# Patient Record
Sex: Male | Born: 1950 | Race: White | Hispanic: No | Smoking: Former smoker
Health system: Southern US, Community
[De-identification: ages and names within clinical notes are randomized; demographics above are authoritative.]

## PROBLEM LIST (undated history)

## (undated) DIAGNOSIS — Z950 Presence of cardiac pacemaker: Secondary | ICD-10-CM

## (undated) DIAGNOSIS — IMO0001 Reserved for inherently not codable concepts without codable children: Secondary | ICD-10-CM

## (undated) DIAGNOSIS — E785 Hyperlipidemia, unspecified: Secondary | ICD-10-CM

## (undated) DIAGNOSIS — I10 Essential (primary) hypertension: Secondary | ICD-10-CM

## (undated) DIAGNOSIS — C229 Malignant neoplasm of liver, not specified as primary or secondary: Secondary | ICD-10-CM

## (undated) DIAGNOSIS — E669 Obesity, unspecified: Secondary | ICD-10-CM

## (undated) DIAGNOSIS — I219 Acute myocardial infarction, unspecified: Secondary | ICD-10-CM

## (undated) HISTORY — PX: TONSILLECTOMY: SUR1361

## (undated) HISTORY — PX: PACEMAKER PLACEMENT: SHX43

## (undated) HISTORY — PX: CORONARY ANGIOPLASTY: SHX604

## (undated) HISTORY — DX: Obesity, unspecified: E66.9

## (undated) HISTORY — PX: PROSTATE SURGERY: SHX751

## (undated) HISTORY — PX: KNEE ARTHROSCOPY: SUR90

## (undated) HISTORY — DX: Presence of cardiac pacemaker: Z95.0

## (undated) HISTORY — DX: Hyperlipidemia, unspecified: E78.5

## (undated) HISTORY — PX: INSERT / REPLACE / REMOVE PACEMAKER: SUR710

## (undated) HISTORY — DX: Essential (primary) hypertension: I10

---
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
## 1984-07-09 HISTORY — PX: BACK SURGERY: SHX140

## 1999-07-10 HISTORY — PX: ABDOMINAL HERNIA REPAIR: SHX539

## 2004-04-11 ENCOUNTER — Ambulatory Visit: Payer: Self-pay | Admitting: Internal Medicine

## 2006-03-13 ENCOUNTER — Ambulatory Visit: Payer: Self-pay | Admitting: Internal Medicine

## 2006-03-22 ENCOUNTER — Ambulatory Visit: Payer: Self-pay | Admitting: Orthopaedic Surgery

## 2010-02-20 ENCOUNTER — Ambulatory Visit: Payer: Self-pay | Admitting: Internal Medicine

## 2011-06-13 ENCOUNTER — Ambulatory Visit: Payer: Self-pay

## 2013-10-15 DIAGNOSIS — I255 Ischemic cardiomyopathy: Secondary | ICD-10-CM | POA: Insufficient documentation

## 2013-10-15 DIAGNOSIS — I252 Old myocardial infarction: Secondary | ICD-10-CM | POA: Insufficient documentation

## 2013-10-15 DIAGNOSIS — I214 Non-ST elevation (NSTEMI) myocardial infarction: Secondary | ICD-10-CM | POA: Insufficient documentation

## 2013-10-15 DIAGNOSIS — I5020 Unspecified systolic (congestive) heart failure: Secondary | ICD-10-CM

## 2013-10-15 DIAGNOSIS — K649 Unspecified hemorrhoids: Secondary | ICD-10-CM | POA: Insufficient documentation

## 2013-12-07 ENCOUNTER — Ambulatory Visit: Payer: Self-pay | Admitting: Physician Assistant

## 2013-12-07 LAB — URINALYSIS, COMPLETE
Bilirubin,UR: NEGATIVE
Glucose,UR: NEGATIVE mg/dL (ref 0–75)
Ketone: NEGATIVE
Nitrite: POSITIVE
Ph: 6.5 (ref 4.5–8.0)
Protein: 100
RBC,UR: >30 /HPF (ref 0–5)
Specific Gravity: 1.015 (ref 1.003–1.030)
Squamous Epithelial: NONE SEEN

## 2013-12-09 LAB — URINE CULTURE

## 2013-12-23 DIAGNOSIS — Z95810 Presence of automatic (implantable) cardiac defibrillator: Secondary | ICD-10-CM | POA: Insufficient documentation

## 2014-01-21 DIAGNOSIS — N401 Enlarged prostate with lower urinary tract symptoms: Secondary | ICD-10-CM

## 2014-01-21 DIAGNOSIS — R339 Retention of urine, unspecified: Secondary | ICD-10-CM | POA: Insufficient documentation

## 2014-01-21 DIAGNOSIS — N138 Other obstructive and reflux uropathy: Secondary | ICD-10-CM | POA: Insufficient documentation

## 2014-01-21 DIAGNOSIS — R310 Gross hematuria: Secondary | ICD-10-CM | POA: Insufficient documentation

## 2014-03-23 DIAGNOSIS — N320 Bladder-neck obstruction: Secondary | ICD-10-CM | POA: Insufficient documentation

## 2014-04-08 DIAGNOSIS — R339 Retention of urine, unspecified: Secondary | ICD-10-CM | POA: Insufficient documentation

## 2014-08-17 ENCOUNTER — Ambulatory Visit: Payer: Self-pay | Admitting: Family Medicine

## 2014-08-31 ENCOUNTER — Ambulatory Visit: Payer: Self-pay | Admitting: Emergency Medicine

## 2015-03-09 ENCOUNTER — Encounter: Payer: Self-pay | Admitting: Emergency Medicine

## 2015-03-09 ENCOUNTER — Other Ambulatory Visit: Payer: Self-pay

## 2015-03-09 ENCOUNTER — Emergency Department
Admission: EM | Admit: 2015-03-09 | Discharge: 2015-03-09 | Disposition: A | Discharge status: Home / Self Care | Payer: PPO | Attending: Emergency Medicine | Admitting: Emergency Medicine

## 2015-03-09 ENCOUNTER — Emergency Department: Payer: PPO

## 2015-03-09 DIAGNOSIS — I472 Ventricular tachycardia, unspecified: Secondary | ICD-10-CM

## 2015-03-09 DIAGNOSIS — I10 Essential (primary) hypertension: Secondary | ICD-10-CM | POA: Diagnosis not present

## 2015-03-09 DIAGNOSIS — R079 Chest pain, unspecified: Secondary | ICD-10-CM | POA: Insufficient documentation

## 2015-03-09 DIAGNOSIS — Y838 Other surgical procedures as the cause of abnormal reaction of the patient, or of later complication, without mention of misadventure at the time of the procedure: Secondary | ICD-10-CM | POA: Insufficient documentation

## 2015-03-09 DIAGNOSIS — T82198A Other mechanical complication of other cardiac electronic device, initial encounter: Secondary | ICD-10-CM | POA: Insufficient documentation

## 2015-03-09 DIAGNOSIS — Z87891 Personal history of nicotine dependence: Secondary | ICD-10-CM | POA: Diagnosis not present

## 2015-03-09 DIAGNOSIS — I252 Old myocardial infarction: Secondary | ICD-10-CM | POA: Insufficient documentation

## 2015-03-09 DIAGNOSIS — R531 Weakness: Secondary | ICD-10-CM | POA: Diagnosis present

## 2015-03-09 HISTORY — DX: Acute myocardial infarction, unspecified: I21.9

## 2015-03-09 LAB — CBC WITH DIFFERENTIAL/PLATELET
BASOS PCT: 1 %
Basophils Absolute: 0.1 10*3/uL (ref 0–0.1)
EOS ABS: 0.2 10*3/uL (ref 0–0.7)
Eosinophils Relative: 2 %
HCT: 46 % (ref 40.0–52.0)
HEMOGLOBIN: 15.8 g/dL (ref 13.0–18.0)
LYMPHS ABS: 1.3 10*3/uL (ref 1.0–3.6)
Lymphocytes Relative: 14 %
MCH: 30 pg (ref 26.0–34.0)
MCHC: 34.3 g/dL (ref 32.0–36.0)
MCV: 87.4 fL (ref 80.0–100.0)
MONO ABS: 0.6 10*3/uL (ref 0.2–1.0)
MONOS PCT: 6 %
Neutro Abs: 7.4 10*3/uL — ABNORMAL HIGH (ref 1.4–6.5)
Neutrophils Relative %: 77 %
Platelets: 202 10*3/uL (ref 150–440)
RBC: 5.26 MIL/uL (ref 4.40–5.90)
RDW: 14 % (ref 11.5–14.5)
WBC: 9.6 10*3/uL (ref 3.8–10.6)

## 2015-03-09 LAB — COMPREHENSIVE METABOLIC PANEL
ALBUMIN: 4.4 g/dL (ref 3.5–5.0)
ALT: 35 U/L (ref 17–63)
AST: 24 U/L (ref 15–41)
Alkaline Phosphatase: 78 U/L (ref 38–126)
Anion gap: 13 (ref 5–15)
BUN: 20 mg/dL (ref 6–20)
CO2: 23 mmol/L (ref 22–32)
Calcium: 10 mg/dL (ref 8.9–10.3)
Chloride: 103 mmol/L (ref 101–111)
Creatinine, Ser: 0.93 mg/dL (ref 0.61–1.24)
GFR calc Af Amer: >60 mL/min (ref >60)
GFR calc non Af Amer: >60 mL/min (ref >60)
GLUCOSE: 109 mg/dL — AB (ref 65–99)
Potassium: 4.2 mmol/L (ref 3.5–5.1)
SODIUM: 139 mmol/L (ref 135–145)
Total Bilirubin: 1.2 mg/dL (ref 0.3–1.2)
Total Protein: 7.8 g/dL (ref 6.5–8.1)

## 2015-03-09 LAB — TROPONIN I: Troponin I: 0.03 ng/mL (ref <0.031)

## 2015-03-09 NOTE — ED Notes (Addendum)
[place] to feel weak and dizzy, pt states that then his pacemaker/defibrillator went off. Pt states it took him to the ground. Pt alert and oriented in NAD distress at this time. Pt took 2 SL nitro and EMS gave him 1 spray en route. Pt states this has never happened before.

## 2015-03-09 NOTE — Discharge Instructions (Signed)
You have been seen in the emergency department after defibrillation (shocked by your pacemaker/defibrillator). It appears that you entered a rhythm called ventricular tachycardia, prior to defibrillation. Your pacemaker/defibrillator is working appropriately, and shocked you appropriately. Your labs today are within normal limits. As we discussed it is extremely important that you call Dr. Ubaldo Glassing in the morning to arrange a follow-up appointment for tomorrow or Friday. If you develop any chest pain, trouble breathing, fast heart rate, or any further shocks please return to the emergency department immediately by calling 911.

## 2015-03-09 NOTE — ED Provider Notes (Signed)
[facility] Emergency Department Provider Note  Time seen: 5:34 PM  I have reviewed the triage vital signs and the nursing notes.   HISTORY  Chief Complaint Pacemaker Problem    HPI Jose Dunlap is a 64 y.o. male with a past medical history of hypertension, hyperlipidemia, [employer] pacemaker/defibrillator placed 2014, presents the emergency department after his defibrillator shocked him. According to the patient [place], when he began feeling very weak, dizzy, nauseated. States within several seconds his defibrillator went off and shocked him bringing him to the ground. Denies loss of consciousness. Does state chest pain but that started after the defibrillator shocked him. Denies any symptoms at this time. Received nitroglycerin by EMS prior to arrival. He has not taken aspirin today. Denies any chest pain, shortness of breath, or any other symptoms currently.    Past Medical History  Diagnosis Date  . MI (myocardial infarction)     There are no active problems to display for this patient.   Past Surgical History  Procedure Laterality Date  . Prostate surgery    . Pacemaker placement      No current outpatient prescriptions on file.  Allergies Review of patient's allergies indicates no known allergies.  History reviewed. No pertinent family history.  Social History Social History  Substance Use Topics  . Smoking status: Former Research scientist (life sciences)  . Smokeless tobacco: Never Used  . Alcohol Use: Yes    Review of Systems Constitutional: Negative for fever. Cardiovascular: Positive for chest pain after defibrillation, denies any currently. Respiratory: Negative for shortness of breath. Gastrointestinal: Negative for abdominal pain Neurological: Negative for headache 10-point ROS otherwise negative.  ____________________________________________   PHYSICAL EXAM:  VITAL SIGNS: ED Triage Vitals  Enc Vitals Group     BP 03/09/15  1727 151/107 mmHg     Pulse Rate 03/09/15 1727 114     Resp 03/09/15 1727 19     Temp 03/09/15 1727 97.8 F (36.6 C)     Temp Source 03/09/15 1727 Oral     SpO2 03/09/15 1727 93 %     Weight 03/09/15 1727 269 lb 6.4 oz (122.199 kg)     Height --      Head Cir --      Peak Flow --      Pain Score --      Pain Loc --      Pain Edu? --      Excl. in [place]? --     Constitutional: Alert and oriented. Well appearing and in no distress. Eyes: Normal exam ENT   Head: Normocephalic and atraumatic.   Mouth/Throat: Mucous membranes are moist. Cardiovascular: Normal rate, regular rhythm. No murmur Respiratory: Normal respiratory effort without tachypnea nor retractions. Breath sounds are clear and equal bilaterally. No wheezes/rales/rhonchi. Gastrointestinal: Soft and nontender. No distention.   Musculoskeletal: Nontender with normal range of motion in all extremities. No lower extremity tenderness or edema. Neurologic:  Normal speech and language. No gross focal neurologic deficits  Skin:  Skin is warm, dry and intact.  Psychiatric: Mood and affect are normal. Speech and behavior are normal. Patient exhibits appropriate insight and judgment.  ____________________________________________    EKG  EKG reviewed and interpreted by myself shows atrial sensed ventricular paced rhythm at 115 beats, no concerning changes noted.  ____________________________________________    RADIOLOGY  Chest x-ray within normal limits  ____________________________________________   INITIAL IMPRESSION / ASSESSMENT AND PLAN / ED COURSE  Pertinent labs &  imaging results that were available during my care of the patient were reviewed by me and considered in my medical decision making (see chart for details).  Patient with weakness/dizziness, nausea, followed by defibrillation. Patient now feels normal, denies any symptoms at this time. We will check labs, interrogated the patient's  pacemaker/defibrillator, and discuss with Dr. Ubaldo Glassing. We will closely monitor in the emergency department on telemetry.  Patient's labs are within normal limits, electrolytes within normal limits. Patient's pacemaker was interrogated, the patient appears to have gone into ventricular tachycardia 255 bpm per 14 seconds before his defibrillator shocked him. Patient denies any symptoms currently. I discussed with Dr. Clayborn Bigness, who is on-call for Dr. Ubaldo Glassing. He states that the patient is feeling well he is safe for discharge home with follow-up in the office. I discussed this plan of care with the patient and offered admission if he felt uncomfortable, the patient strongly wishes to go home and will call Dr. Ubaldo Glassing in the morning. I discussed very strict return precautions for the patient with any chest pain, shortness breath, fast heart rate, or any defibrillations the patient is to return to the emergency department immediately via EMS.  ____________________________________________   FINAL CLINICAL IMPRESSION(S) / ED DIAGNOSES  Defibrillation Weakness Ventricular tachycardia  Harvest Dark, MD 03/09/15 [phone]

## 2015-04-19 ENCOUNTER — Encounter: Payer: Self-pay | Admitting: *Deleted

## 2015-04-19 ENCOUNTER — Ambulatory Visit (INDEPENDENT_AMBULATORY_CARE_PROVIDER_SITE_OTHER): Payer: PPO | Admitting: Family Medicine

## 2015-04-19 VITALS — BP 165/100 | HR 76 | Temp 97.9°F | Resp 16 | Ht 70.5 in | Wt 253.0 lb

## 2015-04-19 DIAGNOSIS — Z950 Presence of cardiac pacemaker: Secondary | ICD-10-CM | POA: Insufficient documentation

## 2015-04-19 DIAGNOSIS — R5382 Chronic fatigue, unspecified: Secondary | ICD-10-CM | POA: Diagnosis not present

## 2015-04-19 DIAGNOSIS — E785 Hyperlipidemia, unspecified: Secondary | ICD-10-CM | POA: Diagnosis not present

## 2015-04-19 DIAGNOSIS — Z95810 Presence of automatic (implantable) cardiac defibrillator: Secondary | ICD-10-CM | POA: Diagnosis not present

## 2015-04-19 DIAGNOSIS — Z23 Encounter for immunization: Secondary | ICD-10-CM | POA: Diagnosis not present

## 2015-04-19 DIAGNOSIS — I252 Old myocardial infarction: Secondary | ICD-10-CM | POA: Diagnosis not present

## 2015-04-19 DIAGNOSIS — I10 Essential (primary) hypertension: Secondary | ICD-10-CM | POA: Insufficient documentation

## 2015-04-19 DIAGNOSIS — K625 Hemorrhage of anus and rectum: Secondary | ICD-10-CM | POA: Diagnosis not present

## 2015-04-19 DIAGNOSIS — R635 Abnormal weight gain: Secondary | ICD-10-CM | POA: Insufficient documentation

## 2015-04-19 MED ORDER — LISINOPRIL 10 MG PO TABS: 10.0000 mg | ORAL_TABLET | Freq: Every day | ORAL | Status: DC
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
## 2015-04-20 LAB — CBC WITH DIFFERENTIAL/PLATELET
Basophils Absolute: 0 10*3/uL (ref 0.0–0.2)
Basos: 0 %
EOS (ABSOLUTE): 0.2 10*3/uL (ref 0.0–0.4)
EOS: 3 %
HEMATOCRIT: 44.4 % (ref 37.5–51.0)
HEMOGLOBIN: 15.6 g/dL (ref 12.6–17.7)
IMMATURE GRANS (ABS): 0.1 10*3/uL (ref 0.0–0.1)
Immature Granulocytes: 1 %
LYMPHS ABS: 1.3 10*3/uL (ref 0.7–3.1)
LYMPHS: 15 %
MCH: 30 pg (ref 26.6–33.0)
MCHC: 35.1 g/dL (ref 31.5–35.7)
MCV: 85 fL (ref 79–97)
MONOCYTES: 9 %
Monocytes Absolute: 0.8 10*3/uL (ref 0.1–0.9)
NEUTROS ABS: 6.1 10*3/uL (ref 1.4–7.0)
Neutrophils: 72 %
Platelets: 215 10*3/uL (ref 150–379)
RBC: 5.2 x10E6/uL (ref 4.14–5.80)
RDW: 13.7 % (ref 12.3–15.4)
WBC: 8.5 10*3/uL (ref 3.4–10.8)

## 2015-04-21 ENCOUNTER — Other Ambulatory Visit: Payer: Self-pay | Admitting: Family Medicine

## 2015-04-21 LAB — COMPREHENSIVE METABOLIC PANEL
A/G RATIO: 1.8 (ref 1.1–2.5)
ALBUMIN: 4.2 g/dL (ref 3.6–4.8)
ALK PHOS: 83 IU/L (ref 39–117)
ALT: 37 IU/L (ref 0–44)
AST: 22 IU/L (ref 0–40)
BILIRUBIN TOTAL: 0.9 mg/dL (ref 0.0–1.2)
BUN / CREAT RATIO: 14 (ref 10–22)
BUN: 12 mg/dL (ref 8–27)
CHLORIDE: 98 mmol/L (ref 97–108)
CO2: 22 mmol/L (ref 18–29)
CREATININE: 0.85 mg/dL (ref 0.76–1.27)
Calcium: 9.3 mg/dL (ref 8.6–10.2)
GFR calc Af Amer: 106 mL/min/{1.73_m2} (ref >59)
GFR calc non Af Amer: 91 mL/min/{1.73_m2} (ref >59)
GLOBULIN, TOTAL: 2.4 g/dL (ref 1.5–4.5)
Glucose: 109 mg/dL — ABNORMAL HIGH (ref 65–99)
POTASSIUM: 4.8 mmol/L (ref 3.5–5.2)
SODIUM: 138 mmol/L (ref 134–144)
Total Protein: 6.6 g/dL (ref 6.0–8.5)

## 2015-04-21 LAB — LIPID PANEL
CHOLESTEROL TOTAL: 190 mg/dL (ref 100–199)
Chol/HDL Ratio: 5.8 ratio units — ABNORMAL HIGH (ref 0.0–5.0)
HDL: 33 mg/dL — ABNORMAL LOW (ref >39)
LDL Calculated: 86 mg/dL (ref 0–99)
TRIGLYCERIDES: 353 mg/dL — AB (ref 0–149)
VLDL Cholesterol Cal: 71 mg/dL — ABNORMAL HIGH (ref 5–40)

## 2015-04-21 LAB — TSH: TSH: 2.81 u[IU]/mL (ref 0.450–4.500)

## 2015-04-21 MED ORDER — SIMVASTATIN 40 MG PO TABS: 40.0000 mg | ORAL_TABLET | Freq: Every day | ORAL | Status: DC

## 2015-04-23 LAB — SPECIMEN STATUS REPORT

## 2015-04-23 LAB — HGB A1C W/O EAG: Hgb A1c MFr Bld: 6 % — ABNORMAL HIGH (ref 4.8–5.6)

## 2015-04-24 LAB — VITAMIN D 1,25 DIHYDROXY
VITAMIN D3 1, 25 (OH): 17 pg/mL
Vitamin D 1, 25 (OH)2 Total: 17 pg/mL
Vitamin D2 1, 25 (OH)2: <10 pg/mL

## 2015-05-02 ENCOUNTER — Other Ambulatory Visit: Payer: Self-pay | Admitting: Family Medicine

## 2015-05-02 DIAGNOSIS — I10 Essential (primary) hypertension: Secondary | ICD-10-CM

## 2015-05-02 MED ORDER — LISINOPRIL 10 MG PO TABS: 10.0000 mg | ORAL_TABLET | Freq: Every day | ORAL | Status: DC

## 2015-05-02 MED ORDER — SIMVASTATIN 40 MG PO TABS: 40.0000 mg | ORAL_TABLET | Freq: Every day | ORAL | Status: DC

## 2015-05-02 NOTE — Telephone Encounter (Signed)
Done.[place] 

## 2015-05-02 NOTE — Telephone Encounter (Signed)
Pt called requesting a refill on  Lisinopril 10 mg, simvastatin 20 mg,  [facility]  [phone].

## 2015-05-04 ENCOUNTER — Ambulatory Visit (INDEPENDENT_AMBULATORY_CARE_PROVIDER_SITE_OTHER): Payer: PPO | Admitting: Family Medicine

## 2015-05-04 ENCOUNTER — Encounter: Payer: Self-pay | Admitting: Family Medicine

## 2015-05-04 VITALS — BP 150/95 | HR 76 | Temp 98.1°F | Resp 16 | Ht 70.0 in | Wt 252.6 lb

## 2015-05-04 DIAGNOSIS — Z95810 Presence of automatic (implantable) cardiac defibrillator: Secondary | ICD-10-CM | POA: Diagnosis not present

## 2015-05-04 DIAGNOSIS — E785 Hyperlipidemia, unspecified: Secondary | ICD-10-CM

## 2015-05-04 DIAGNOSIS — Z23 Encounter for immunization: Secondary | ICD-10-CM | POA: Diagnosis not present

## 2015-05-04 DIAGNOSIS — I214 Non-ST elevation (NSTEMI) myocardial infarction: Secondary | ICD-10-CM | POA: Diagnosis not present

## 2015-05-04 DIAGNOSIS — I259 Chronic ischemic heart disease, unspecified: Secondary | ICD-10-CM | POA: Diagnosis not present

## 2015-05-04 DIAGNOSIS — I10 Essential (primary) hypertension: Secondary | ICD-10-CM

## 2015-05-04 MED ORDER — LISINOPRIL 20 MG PO TABS: 10.0000 mg | ORAL_TABLET | Freq: Every day | ORAL | Status: DC

## 2015-05-04 NOTE — Progress Notes (Signed)
. Name: Jose Dunlap   MRN: [id number]    DOB: September 11, 1950   Date:05/04/2015       Progress Note  Subjective  Chief Complaint  Chief Complaint  Patient presents with  . Hypertension    Hypertension Associated symptoms include shortness of breath (with activity). Pertinent negatives include no blurred vision, chest pain, headaches, neck pain, orthopnea or palpitations.     No problem-specific assessment & plan notes found for this encounter.   Past Medical History  Diagnosis Date  . MI (myocardial infarction) ([place])   . Essential hypertension   . Hyperlipidemia   . Pacemaker     Social History  Substance Use Topics  . Smoking status: Former Research scientist (life sciences)  . Smokeless tobacco: Never Used  . Alcohol Use: Yes     Current outpatient prescriptions:  .  aspirin 81 MG chewable tablet, Chew 81 mg by mouth daily., Disp: , Rfl:  .  carvedilol (COREG) 6.25 MG tablet, Take 6.25 mg by mouth 2 (two) times daily., Disp: , Rfl:  .  clopidogrel (PLAVIX) 75 MG tablet, Take 75 mg by mouth daily., Disp: , Rfl:  .  lisinopril (PRINIVIL,ZESTRIL) 10 MG tablet, Take 1 tablet (10 mg total) by mouth daily., Disp: 90 tablet, Rfl: 3 .  Multiple Vitamin (MULTIVITAMIN) capsule, Take 1 capsule by mouth daily., Disp: , Rfl:  .  nitroGLYCERIN (NITROSTAT) 0.4 MG SL tablet, Place 0.4 mg under the tongue as needed., Disp: , Rfl:  .  simvastatin (ZOCOR) 40 MG tablet, Take 1 tablet (40 mg total) by mouth at bedtime., Disp: 90 tablet, Rfl: 3 .  spironolactone (ALDACTONE) 25 MG tablet, Take 25 mg by mouth daily., Disp: , Rfl:   No Known Allergies  Review of Systems  Constitutional: Negative for fever and chills.  HENT: Negative for hearing loss.   Eyes: Negative for blurred vision and double vision.  Respiratory: Positive for shortness of breath (with activity). Negative for wheezing.   Cardiovascular: Negative for chest pain, palpitations, orthopnea and leg swelling.  Gastrointestinal: Negative for  heartburn, abdominal pain and blood in stool.  Genitourinary: Negative for dysuria, urgency and frequency.  Musculoskeletal: Negative for myalgias, back pain and neck pain.  Skin: Negative for rash.  Neurological: Negative for dizziness, tremors and headaches.      Objective  Filed Vitals:   05/04/15 0834  BP: 142/96  Pulse: 76  Temp: 98.1 F (36.7 C)  TempSrc: Oral  Resp: 16  Height: 5' 10"  (1.778 m)  Weight: 252 lb 9.6 oz (114.579 kg)     Physical Exam  Constitutional: He is oriented to person, place, and time and well-developed, well-nourished, and in no distress. No distress.  HENT:  Head: Normocephalic and atraumatic.  Eyes: Conjunctivae and EOM are normal. Pupils are equal, round, and reactive to light. No scleral icterus.  Neck: Normal range of motion. Neck supple. Carotid bruit is not present. No thyromegaly present.  Cardiovascular: Normal rate, regular rhythm, normal heart sounds and intact distal pulses.  Exam reveals no gallop and no friction rub.   No murmur heard. Pulmonary/Chest: Effort normal and breath sounds normal. No respiratory distress. He has no wheezes. He has no rales.  Abdominal: Soft. Bowel sounds are normal. He exhibits no distension. There is no tenderness. There is no rebound.  Musculoskeletal: Normal range of motion. He exhibits no edema.  Lymphadenopathy:    He has no cervical adenopathy.  Neurological: He is alert and oriented to person, place, and time.  Vitals reviewed.  Recent Results (from the past 2160 hour(s))  Comprehensive metabolic panel     Status: Abnormal   Collection Time: 03/09/15  5:34 PM  Result Value Ref Range   Sodium 139 135 - 145 mmol/L   Potassium 4.2 3.5 - 5.1 mmol/L   Chloride 103 101 - 111 mmol/L   CO2 23 22 - 32 mmol/L   Glucose, Bld 109 (H) 65 - 99 mg/dL   BUN 20 6 - 20 mg/dL   Creatinine, Ser 0.93 0.61 - 1.24 mg/dL   Calcium 10.0 8.9 - 10.3 mg/dL   Total Protein 7.8 6.5 - 8.1 g/dL   Albumin 4.4 3.5  - 5.0 g/dL   AST 24 15 - 41 U/L   ALT 35 17 - 63 U/L   Alkaline Phosphatase 78 38 - 126 U/L   Total Bilirubin 1.2 0.3 - 1.2 mg/dL   GFR calc non Af Amer >60 >60 mL/min   GFR calc Af Amer >60 >60 mL/min    Comment: (NOTE) The eGFR has been calculated using the CKD EPI equation. This calculation has not been validated in all clinical situations. eGFR's persistently <60 mL/min signify possible Chronic Kidney Disease.    Anion gap 13 5 - 15  Troponin I     Status: None   Collection Time: 03/09/15  5:34 PM  Result Value Ref Range   Troponin I 0.03 <0.031 ng/mL    Comment:        NO INDICATION OF MYOCARDIAL INJURY.   CBC with Differential     Status: Abnormal   Collection Time: 03/09/15  5:34 PM  Result Value Ref Range   WBC 9.6 3.8 - 10.6 K/uL   RBC 5.26 4.40 - 5.90 MIL/uL   Hemoglobin 15.8 13.0 - 18.0 g/dL   HCT 46.0 40.0 - 52.0 %   MCV 87.4 80.0 - 100.0 fL   MCH 30.0 26.0 - 34.0 pg   MCHC 34.3 32.0 - 36.0 g/dL   RDW 14.0 11.5 - 14.5 %   Platelets 202 150 - 440 K/uL   Neutrophils Relative % 77 %   Neutro Abs 7.4 (H) 1.4 - 6.5 K/uL   Lymphocytes Relative 14 %   Lymphs Abs 1.3 1.0 - 3.6 K/uL   Monocytes Relative 6 %   Monocytes Absolute 0.6 0.2 - 1.0 K/uL   Eosinophils Relative 2 %   Eosinophils Absolute 0.2 0 - 0.7 K/uL   Basophils Relative 1 %   Basophils Absolute 0.1 0 - 0.1 K/uL  Comprehensive Metabolic Panel (CMET)     Status: Abnormal   Collection Time: 04/20/15  8:16 AM  Result Value Ref Range   Glucose 109 (H) 65 - 99 mg/dL   BUN 12 8 - 27 mg/dL   Creatinine, Ser 0.85 0.76 - 1.27 mg/dL   GFR calc non Af Amer 91 >59 mL/min/1.73   GFR calc Af Amer 106 >59 mL/min/1.73   BUN/Creatinine Ratio 14 10 - 22   Sodium 138 134 - 144 mmol/L    Comment: **Effective April 25, 2015 the reference interval**   for Sodium, Serum will be changing to:                                             136 - 144    Potassium 4.8 3.5 - 5.2 mmol/L    Comment: **Effective April 25, 2015 the  reference interval**   for Potassium, Serum will be changing to:                          0 -  7 days        3.7 - 5.2                          8 - 30 days        3.7 - 6.4                          1 -  6 months      3.8 - 6.0                   7 months -  1 year        3.8 - 5.3                              >1 year        3.5 - 5.2    Chloride 98 97 - 108 mmol/L    Comment: **Effective April 25, 2015 the reference interval**   for Chloride, Serum will be changing to:                                              97 - 106    CO2 22 18 - 29 mmol/L   Calcium 9.3 8.6 - 10.2 mg/dL   Total Protein 6.6 6.0 - 8.5 g/dL   Albumin 4.2 3.6 - 4.8 g/dL   Globulin, Total 2.4 1.5 - 4.5 g/dL   Albumin/Globulin Ratio 1.8 1.1 - 2.5   Bilirubin Total 0.9 0.0 - 1.2 mg/dL   Alkaline Phosphatase 83 39 - 117 IU/L   AST 22 0 - 40 IU/L   ALT 37 0 - 44 IU/L  Lipid Profile     Status: Abnormal   Collection Time: 04/20/15  8:16 AM  Result Value Ref Range   Cholesterol, Total 190 100 - 199 mg/dL   Triglycerides 353 (H) 0 - 149 mg/dL   HDL 33 (L) >39 mg/dL    Comment: According to ATP-III Guidelines, HDL-C >59 mg/dL is considered a negative risk factor for CHD.    VLDL Cholesterol Cal 71 (H) 5 - 40 mg/dL   LDL Calculated 86 0 - 99 mg/dL   Chol/HDL Ratio 5.8 (H) 0.0 - 5.0 ratio units    Comment:                                   T. Chol/HDL Ratio                                             Men  Women                               1/2 Avg.Risk  3.4    3.3  Avg.Risk  5.0    4.4                                2X Avg.Risk  9.6    7.1                                3X Avg.Risk 23.4   11.0   CBC with Differential     Status: None   Collection Time: 04/20/15  8:16 AM  Result Value Ref Range   WBC 8.5 3.4 - 10.8 x10E3/uL   RBC 5.20 4.14 - 5.80 x10E6/uL   Hemoglobin 15.6 12.6 - 17.7 g/dL   Hematocrit 44.4 37.5 - 51.0 %   MCV 85 79 - 97 fL   MCH 30.0 26.6 - 33.0 pg    MCHC 35.1 31.5 - 35.7 g/dL   RDW 13.7 12.3 - 15.4 %   Platelets 215 150 - 379 x10E3/uL   Neutrophils 72 %   Lymphs 15 %   Monocytes 9 %   Eos 3 %   Basos 0 %   Neutrophils Absolute 6.1 1.4 - 7.0 x10E3/uL   Lymphocytes Absolute 1.3 0.7 - 3.1 x10E3/uL   Monocytes Absolute 0.8 0.1 - 0.9 x10E3/uL   EOS (ABSOLUTE) 0.2 0.0 - 0.4 x10E3/uL   Basophils Absolute 0.0 0.0 - 0.2 x10E3/uL   Immature Granulocytes 1 %   Immature Grans (Abs) 0.1 0.0 - 0.1 x10E3/uL  TSH     Status: None   Collection Time: 04/20/15  8:16 AM  Result Value Ref Range   TSH 2.810 0.450 - 4.500 uIU/mL  Vitamin D 1,25 dihydroxy     Status: None   Collection Time: 04/20/15  8:16 AM  Result Value Ref Range   Vitamin D 1, 25 (OH)2 Total 17 pg/mL    Comment: Reference Range: Adults: 21 - 65    Vitamin D2 1, 25 (OH)2 <10 pg/mL   Vitamin D3 1, 25 (OH)2 17 pg/mL  Specimen status report     Status: None   Collection Time: 04/20/15  8:16 AM  Result Value Ref Range   specimen status report Comment     Comment: Written Authorization Written Authorization Written Authorization Received. Authorization received from [facility] 04-22-2015 Logged by PATRICIA  POOLE   Hgb A1c w/o eAG     Status: Abnormal   Collection Time: 04/20/15  8:16 AM  Result Value Ref Range   Hgb A1c MFr Bld 6.0 (H) 4.8 - 5.6 %     Assessment & Plan  1. Essential hypertension  - lisinopril (PRINIVIL,ZESTRIL) 20 MG tablet; Take 0.5 tablets (10 mg total) by mouth daily.  Dispense: 180 tablet; Refill: 3  2. Immunization due  - Pneumococcal conjugate vaccine 13-valent - Tdap vaccine greater than or equal to 7yo IM  3. Chronic ischemic heart disease   4. Acute subendocardial infarction ([place])   5. Hyperlipidemia   6. Automatic implantable cardioverter-defibrillator in situ

## 2015-05-04 NOTE — Patient Instructions (Signed)
Continue current meds at present doses except the Lisinopril.

## 2015-05-18 ENCOUNTER — Encounter: Payer: Self-pay | Admitting: *Deleted

## 2015-05-19 ENCOUNTER — Ambulatory Visit: Payer: PPO | Admitting: Certified Registered Nurse Anesthetist

## 2015-05-19 ENCOUNTER — Encounter
Admission: RE | Disposition: A | Discharge status: Home / Self Care | Payer: Self-pay | Source: Ambulatory Visit | Attending: Gastroenterology

## 2015-05-19 ENCOUNTER — Encounter: Payer: Self-pay | Admitting: *Deleted

## 2015-05-19 ENCOUNTER — Ambulatory Visit
Admission: RE | Admit: 2015-05-19 | Discharge: 2015-05-19 | Disposition: A | Discharge status: Home / Self Care | Payer: PPO | Source: Ambulatory Visit | Attending: Gastroenterology | Admitting: Gastroenterology

## 2015-05-19 DIAGNOSIS — Z79899 Other long term (current) drug therapy: Secondary | ICD-10-CM | POA: Diagnosis not present

## 2015-05-19 DIAGNOSIS — Z87891 Personal history of nicotine dependence: Secondary | ICD-10-CM | POA: Insufficient documentation

## 2015-05-19 DIAGNOSIS — Z95810 Presence of automatic (implantable) cardiac defibrillator: Secondary | ICD-10-CM | POA: Diagnosis not present

## 2015-05-19 DIAGNOSIS — K621 Rectal polyp: Secondary | ICD-10-CM | POA: Diagnosis not present

## 2015-05-19 DIAGNOSIS — I252 Old myocardial infarction: Secondary | ICD-10-CM | POA: Diagnosis not present

## 2015-05-19 DIAGNOSIS — Z7902 Long term (current) use of antithrombotics/antiplatelets: Secondary | ICD-10-CM | POA: Diagnosis not present

## 2015-05-19 DIAGNOSIS — Z955 Presence of coronary angioplasty implant and graft: Secondary | ICD-10-CM | POA: Insufficient documentation

## 2015-05-19 DIAGNOSIS — I10 Essential (primary) hypertension: Secondary | ICD-10-CM | POA: Diagnosis not present

## 2015-05-19 DIAGNOSIS — K625 Hemorrhage of anus and rectum: Secondary | ICD-10-CM | POA: Insufficient documentation

## 2015-05-19 HISTORY — DX: Reserved for inherently not codable concepts without codable children: IMO0001

## 2015-05-19 HISTORY — PX: COLONOSCOPY WITH PROPOFOL: SHX5780

## 2015-05-19 SURGERY — COLONOSCOPY WITH PROPOFOL
Anesthesia: General

## 2015-05-19 MED ORDER — FENTANYL CITRATE (PF) 100 MCG/2ML IJ SOLN
INTRAMUSCULAR | Status: DC | PRN
  Administered 2015-05-19: 50 ug via INTRAVENOUS

## 2015-05-19 MED ORDER — SODIUM CHLORIDE 0.9 % IV SOLN
INTRAVENOUS | Status: DC
  Administered 2015-05-19: 1000 mL via INTRAVENOUS

## 2015-05-19 MED ORDER — MIDAZOLAM HCL 5 MG/5ML IJ SOLN
INTRAMUSCULAR | Status: DC | PRN
  Administered 2015-05-19: 1 mg via INTRAVENOUS

## 2015-05-19 MED ORDER — SODIUM CHLORIDE 0.9 % IV SOLN: INTRAVENOUS | Status: DC

## 2015-05-19 MED ORDER — CARVEDILOL 6.25 MG PO TABS
ORAL_TABLET | ORAL | Status: AC
  Administered 2015-05-19: 6.25 mg via ORAL
  Filled 2015-05-19: qty 1

## 2015-05-19 MED ORDER — PROPOFOL 500 MG/50ML IV EMUL
INTRAVENOUS | Status: DC | PRN
  Administered 2015-05-19: 120 ug/kg/min via INTRAVENOUS

## 2015-05-19 MED ORDER — PROPOFOL 10 MG/ML IV BOLUS
INTRAVENOUS | Status: DC | PRN
  Administered 2015-05-19: 50 mg via INTRAVENOUS

## 2015-05-19 MED ORDER — LIDOCAINE HCL (CARDIAC) 20 MG/ML IV SOLN
INTRAVENOUS | Status: DC | PRN
  Administered 2015-05-19: 60 mg via INTRAVENOUS

## 2015-05-19 NOTE — Anesthesia Postprocedure Evaluation (Signed)
  Anesthesia Post-op Note  Patient: Jose Dunlap  Procedure(s) Performed: Procedure(s): COLONOSCOPY WITH PROPOFOL (N/A)  Anesthesia type:General  Patient location: PACU  Post pain: Pain level controlled  Post assessment: Post-op Vital signs reviewed, Patient's Cardiovascular Status Stable, Respiratory Function Stable, Patent Airway and No signs of Nausea or vomiting  Post vital signs: Reviewed and stable  Last Vitals:  Filed Vitals:   05/19/15 0859  BP: 139/89  Pulse: 79  Temp:   Resp: 16    Level of consciousness: awake, alert  and patient cooperative  Complications: No apparent anesthesia complications

## 2015-05-19 NOTE — Anesthesia Preprocedure Evaluation (Signed)
Anesthesia Evaluation  Patient identified by MRN, date of birth, ID band Patient awake    Reviewed: Allergy & Precautions, NPO status , Patient's Chart, lab work & pertinent test results, reviewed documented beta blocker date and time   Airway Mallampati: III  TM Distance: >3 FB Neck ROM: Full    Dental  (+) Chipped   Pulmonary shortness of breath and with exertion, former smoker,    Pulmonary exam normal        Cardiovascular hypertension, Pt. on medications and Pt. on home beta blockers + Past MI  Normal cardiovascular exam+ pacemaker + Cardiac Defibrillator      Neuro/Psych negative neurological ROS  negative psych ROS   GI/Hepatic negative GI ROS, Neg liver ROS,   Endo/Other  negative endocrine ROS  Renal/GU negative Renal ROS  negative genitourinary   Musculoskeletal negative musculoskeletal ROS (+)   Abdominal Normal abdominal exam  (+)   Peds negative pediatric ROS (+)  Hematology negative hematology ROS (+)   Anesthesia Other Findings   Reproductive/Obstetrics                             Anesthesia Physical Anesthesia Plan  ASA: III  Anesthesia Plan: General   Post-op Pain Management:    Induction: Intravenous  Airway Management Planned: Nasal Cannula  Additional Equipment:   Intra-op Plan:   Post-operative Plan:   Informed Consent: I have reviewed the patients History and Physical, chart, labs and discussed the procedure including the risks, benefits and alternatives for the proposed anesthesia with the patient or authorized representative who has indicated his/her understanding and acceptance.   Dental advisory given  Plan Discussed with: CRNA and Surgeon  Anesthesia Plan Comments:         Anesthesia Quick Evaluation

## 2015-05-19 NOTE — H&P (Signed)
  Date of Initial H&P:05/05/2015 History reviewed, patient examined, no change in status, stable for surgery.

## 2015-05-19 NOTE — Transfer of Care (Signed)
Immediate Anesthesia Transfer of Care Note  Patient: Jose Dunlap  Procedure(s) Performed: Procedure(s): COLONOSCOPY WITH PROPOFOL (N/A)  Patient Location: PACU  Anesthesia Type:General  Level of Consciousness: awake, oriented and patient cooperative  Airway & Oxygen Therapy: Patient Spontanous Breathing and Patient connected to nasal cannula oxygen  Post-op Assessment: Report given to RN and Post -op Vital signs reviewed and stable  Post vital signs: Reviewed and stable  Last Vitals:  Filed Vitals:   05/19/15 0828  BP: 111/67  Pulse: 80  Temp: 36 C  Resp: 17    Complications: No apparent anesthesia complications

## 2015-05-19 NOTE — Op Note (Signed)
[facility] Patient Name: Jose Dunlap Procedure Date: 05/19/2015 8:07 AM MRN: [id number] Account #: [id number] Date of Birth: 1950-12-06 Admit Type: Outpatient Age: 64 Room: [facility] ENDO ROOM 4 Gender: Male Note Status: Finalized Procedure:         Colonoscopy Indications:       Rectal bleeding, Pt last took plavix yesterday. Thus, this                     becomes a diagnostic colonoscopy. Providers:         Lupita Dawn. Candace Cruise, MD Referring MD:      Laurine Blazer (Referring MD) Medicines:         Monitored Anesthesia Care Complications:     No immediate complications. Procedure:         Pre-Anesthesia Assessment:                    - Prior to the procedure, a History and Physical was                     performed, and patient medications, allergies and                     sensitivities were reviewed. The patient's tolerance of                     previous anesthesia was reviewed.                    - The risks and benefits of the procedure and the sedation                     options and risks were discussed with the patient. All                     questions were answered and informed consent was obtained.                    - After reviewing the risks and benefits, the patient was                     deemed in satisfactory condition to undergo the procedure.                    After obtaining informed consent, the colonoscope was                     passed under direct vision. Throughout the procedure, the                     patient's blood pressure, pulse, and oxygen saturations                     were monitored continuously. The Olympus CF-H180AL                     colonoscope ( S#: [id number] ) was introduced through the                     anus and advanced to the the cecum, identified by                     appendiceal orifice and ileocecal valve. The colonoscopy  was performed without difficulty. The patient  tolerated                     the procedure well. The quality of the bowel preparation                     was good. Findings:      Two sessile polyps were found in the rectum. The polyps were small in       size. Not removed due to plavix.      The exam was otherwise without abnormality. Impression:        - Two small polyps in the rectum.                    - The examination was otherwise normal.                    - No specimens collected. Recommendation:    - Discharge patient to home.                    - The findings and recommendations were discussed with the                     patient.                    - Repeat colonoscopy later when OFF plavix. Procedure Code(s): --- Professional ---                    [phone], Colonoscopy, flexible; diagnostic, including                     collection of specimen(s) by brushing or washing, when                     performed (separate procedure) Diagnosis Code(s): --- Professional ---                    K62.1, Rectal polyp                    K62.5, Hemorrhage of anus and rectum CPT copyright 2014 American Medical Association. All rights reserved. The codes documented in this report are preliminary and upon coder review may  be revised to meet current compliance requirements. Hulen Luster, MD 05/19/2015 8:24:37 AM This report has been signed electronically. Number of Addenda: 0 Note Initiated On: 05/19/2015 8:07 AM Scope Withdrawal Time: 0 hours 7 minutes 35 seconds  Total Procedure Duration: 0 hours 11 minutes 12 seconds       [facility]

## 2015-05-23 ENCOUNTER — Encounter: Payer: Self-pay | Admitting: Gastroenterology

## 2015-06-23 ENCOUNTER — Ambulatory Visit (INDEPENDENT_AMBULATORY_CARE_PROVIDER_SITE_OTHER): Payer: PPO | Admitting: Family Medicine

## 2015-06-23 ENCOUNTER — Encounter: Payer: Self-pay | Admitting: Family Medicine

## 2015-06-23 VITALS — BP 144/88 | HR 85 | Temp 97.7°F | Resp 16 | Ht 70.5 in | Wt 254.0 lb

## 2015-06-23 DIAGNOSIS — I10 Essential (primary) hypertension: Secondary | ICD-10-CM | POA: Diagnosis not present

## 2015-06-23 DIAGNOSIS — M792 Neuralgia and neuritis, unspecified: Secondary | ICD-10-CM | POA: Insufficient documentation

## 2015-06-23 DIAGNOSIS — G5791 Unspecified mononeuropathy of right lower limb: Secondary | ICD-10-CM | POA: Diagnosis not present

## 2015-06-23 MED ORDER — CAPSAICIN 0.025 % EX CREA: TOPICAL_CREAM | Freq: Two times a day (BID) | CUTANEOUS | Status: DC

## 2015-06-23 MED ORDER — LISINOPRIL 30 MG PO TABS: 30.0000 mg | ORAL_TABLET | Freq: Every day | ORAL | Status: DC

## 2015-06-23 NOTE — Assessment & Plan Note (Signed)
Increase lisinopril to 30mg  daily. Encouraged pt to reduce sodium and increase exercise. Check BMP next visit- last potassium 1 mos ago was WNL.

## 2015-06-23 NOTE — Patient Instructions (Signed)
Your goal blood pressure is 140/90 Work on low salt/sodium diet - goal <1.5gm (1,500mg ) per day. Eat a diet high in fruits/vegetables and whole grains.  Look into mediterranean and DASH diet. Goal activity is 124min/wk of moderate intensity exercise.  This can be split into 30 minute chunks.  If you are not at this level, you can start with smaller 10-15 min increments and slowly build up activity. Look at [email or web address] for more resources  Please seek immediate medical attention at ER or Urgent Care if you develop: Chest pain, pressure or tightness. Shortness of breath accompanied by nausea or diaphoresis Visual changes Numbness or tingling on one side of the body Facial droop Altered mental status Or any concerning symptoms.   Try applying Zosterix cream twice daily to the spot on your back. It sounds like nerve pain.

## 2015-06-23 NOTE — Assessment & Plan Note (Signed)
Pain likely nerve etiology. Treat with OTC capsaicin cream BID PRN.  Pt aware to call the clinic if pain/tingling increases.  Consider oral gabapentin with no improvement.
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
## 2015-08-12 DIAGNOSIS — Z87891 Personal history of nicotine dependence: Secondary | ICD-10-CM | POA: Diagnosis not present

## 2015-08-12 DIAGNOSIS — R339 Retention of urine, unspecified: Secondary | ICD-10-CM | POA: Diagnosis not present

## 2015-08-12 DIAGNOSIS — N401 Enlarged prostate with lower urinary tract symptoms: Secondary | ICD-10-CM | POA: Diagnosis not present

## 2015-08-12 DIAGNOSIS — I10 Essential (primary) hypertension: Secondary | ICD-10-CM | POA: Diagnosis not present

## 2015-08-12 DIAGNOSIS — I252 Old myocardial infarction: Secondary | ICD-10-CM | POA: Diagnosis not present

## 2015-08-12 DIAGNOSIS — Z95810 Presence of automatic (implantable) cardiac defibrillator: Secondary | ICD-10-CM | POA: Diagnosis not present

## 2015-08-12 DIAGNOSIS — Z803 Family history of malignant neoplasm of breast: Secondary | ICD-10-CM | POA: Diagnosis not present

## 2015-08-12 DIAGNOSIS — N138 Other obstructive and reflux uropathy: Secondary | ICD-10-CM | POA: Diagnosis not present

## 2015-08-12 DIAGNOSIS — N320 Bladder-neck obstruction: Secondary | ICD-10-CM | POA: Diagnosis not present

## 2015-09-15 DIAGNOSIS — I10 Essential (primary) hypertension: Secondary | ICD-10-CM | POA: Diagnosis not present

## 2015-09-15 DIAGNOSIS — I214 Non-ST elevation (NSTEMI) myocardial infarction: Secondary | ICD-10-CM | POA: Diagnosis not present

## 2015-09-15 DIAGNOSIS — E782 Mixed hyperlipidemia: Secondary | ICD-10-CM | POA: Diagnosis not present

## 2015-09-15 DIAGNOSIS — I517 Cardiomegaly: Secondary | ICD-10-CM | POA: Diagnosis not present

## 2015-09-15 DIAGNOSIS — Z95810 Presence of automatic (implantable) cardiac defibrillator: Secondary | ICD-10-CM | POA: Diagnosis not present

## 2015-09-15 DIAGNOSIS — I255 Ischemic cardiomyopathy: Secondary | ICD-10-CM | POA: Diagnosis not present

## 2015-09-15 DIAGNOSIS — I259 Chronic ischemic heart disease, unspecified: Secondary | ICD-10-CM | POA: Diagnosis not present

## 2015-11-21 ENCOUNTER — Ambulatory Visit (INDEPENDENT_AMBULATORY_CARE_PROVIDER_SITE_OTHER): Payer: PPO | Admitting: Family Medicine

## 2015-11-21 ENCOUNTER — Encounter: Payer: Self-pay | Admitting: Family Medicine

## 2015-11-21 VITALS — BP 140/90 | HR 88 | Temp 98.4°F | Resp 16 | Ht 70.5 in | Wt 247.0 lb

## 2015-11-21 DIAGNOSIS — I214 Non-ST elevation (NSTEMI) myocardial infarction: Secondary | ICD-10-CM | POA: Diagnosis not present

## 2015-11-21 DIAGNOSIS — I10 Essential (primary) hypertension: Secondary | ICD-10-CM | POA: Diagnosis not present

## 2015-11-21 DIAGNOSIS — E785 Hyperlipidemia, unspecified: Secondary | ICD-10-CM

## 2015-11-21 MED ORDER — CARVEDILOL 6.25 MG PO TABS: ORAL_TABLET | ORAL | Status: DC

## 2015-11-21 MED ORDER — LISINOPRIL 30 MG PO TABS: 30.0000 mg | ORAL_TABLET | Freq: Every day | ORAL | Status: DC

## 2015-11-21 NOTE — Progress Notes (Signed)
Name: Jose Dunlap   MRN: [id number]    DOB: Aug 02, 1950   Date:11/21/2015       Progress Note  Subjective  Chief Complaint  Chief Complaint  Patient presents with  . Hypertension    HPI Here for f/u of HBP.  Also has ASCVD and elevated lipids.  He had been on lower dose of Lisinopril by mistake and BP elevated and he felt bad but back on proper dose for 3 days.  No problem-specific assessment & plan notes found for this encounter.   Past Medical History  Diagnosis Date  . MI (myocardial infarction) ([place])   . Essential hypertension   . Hyperlipidemia   . Pacemaker   . Shortness of breath dyspnea   . Obesity     Past Surgical History  Procedure Laterality Date  . Prostate surgery    . Pacemaker placement    . Back surgery  1986  . Abdominal hernia repair  2001  . Knee arthroscopy    . Coronary angioplasty    . Insert / replace / remove pacemaker    . Tonsillectomy    . Colonoscopy with propofol N/A 05/19/2015    Procedure: COLONOSCOPY WITH PROPOFOL;  Surgeon: Hulen Luster, MD;  Location: [facility] ENDOSCOPY;  Service: Gastroenterology;  Laterality: N/A;    History reviewed. No pertinent family history.  Social History   Social History  . Marital Status: Divorced    Spouse Name: N/A  . Number of Children: N/A  . Years of Education: N/A   Occupational History  . Not on file.   Social History Main Topics  . Smoking status: Former Research scientist (life sciences)  . Smokeless tobacco: Never Used  . Alcohol Use: Yes  . Drug Use: No  . Sexual Activity: Not on file   Other Topics Concern  . Not on file   Social History Narrative     Current outpatient prescriptions:  .  aspirin 81 MG chewable tablet, Chew 81 mg by mouth daily., Disp: , Rfl:  .  carvedilol (COREG) 6.25 MG tablet, Take 1 tablet in AM ands 2 tablets in PM, Disp: 90 tablet, Rfl: 6 .  clopidogrel (PLAVIX) 75 MG tablet, Take 75 mg by mouth daily., Disp: , Rfl:  .  lisinopril (PRINIVIL,ZESTRIL) 30 MG tablet, Take 1 tablet  (30 mg total) by mouth daily., Disp: 90 tablet, Rfl: 3 .  Magnesium Cl-Calcium Carbonate (SLOW MAGNESIUM/CALCIUM) 70-117 MG TBEC, Take by mouth., Disp: , Rfl:  .  Multiple Vitamin (MULTIVITAMIN) capsule, Take 1 capsule by mouth daily., Disp: , Rfl:  .  nitroGLYCERIN (NITROSTAT) 0.4 MG SL tablet, Place 0.4 mg under the tongue as needed., Disp: , Rfl:  .  simvastatin (ZOCOR) 40 MG tablet, Take 1 tablet (40 mg total) by mouth at bedtime., Disp: 90 tablet, Rfl: 3 .  spironolactone (ALDACTONE) 25 MG tablet, Take 25 mg by mouth daily., Disp: , Rfl:  .  Cholecalciferol (D 1000) 1000 units capsule, Take 1 capsule by mouth daily., Disp: , Rfl:  .  vitamin B-12 (CYANOCOBALAMIN) 250 MCG tablet, Take 1 tablet by mouth daily., Disp: , Rfl:   Not on File   Review of Systems  Constitutional: Negative for fever, chills, weight loss and malaise/fatigue.  HENT: Negative for hearing loss.   Eyes: Negative for blurred vision and double vision.  Respiratory: Negative for cough, shortness of breath and wheezing.   Cardiovascular: Negative for chest pain, palpitations and leg swelling.  Gastrointestinal: Negative for heartburn, abdominal pain and blood  in stool.  Genitourinary: Negative for dysuria, urgency and frequency.  Musculoskeletal: Negative for myalgias and joint pain.  Skin: Negative for rash.  Neurological: Negative for tremors, weakness and headaches.      Objective  Filed Vitals:   11/21/15 1525 11/21/15 1610  BP: 152/92 140/90  Pulse: 84 88  Temp: 98.4 F (36.9 C)   TempSrc: Oral   Resp: 16   Height: 5' 10.5" (1.791 m)   Weight: 247 lb (112.038 kg)     Physical Exam  Constitutional: He is oriented to person, place, and time and well-developed, well-nourished, and in no distress. No distress.  HENT:  Head: Normocephalic and atraumatic.  Eyes: Conjunctivae and EOM are normal. Pupils are equal, round, and reactive to light.  Neck: Normal range of motion. Neck supple. Carotid bruit is  not present. No thyromegaly present.  Cardiovascular: Normal rate, regular rhythm and normal heart sounds.  Exam reveals no gallop and no friction rub.   No murmur heard. Pulmonary/Chest: Effort normal and breath sounds normal. No respiratory distress. He has no wheezes. He has no rales.  Musculoskeletal: He exhibits no edema.  Lymphadenopathy:    He has no cervical adenopathy.  Neurological: He is alert and oriented to person, place, and time.  Vitals reviewed.      No results found for this or any previous visit (from the past 2160 hour(s)).   Assessment & Plan  Problem List Items Addressed This Visit      Cardiovascular and Mediastinum   HBP (high blood pressure) - Primary   Relevant Medications   carvedilol (COREG) 6.25 MG tablet   lisinopril (PRINIVIL,ZESTRIL) 30 MG tablet   Acute subendocardial infarction (HCC)   Relevant Medications   carvedilol (COREG) 6.25 MG tablet   lisinopril (PRINIVIL,ZESTRIL) 30 MG tablet   Acute non-ST elevation myocardial infarction (NSTEMI) (HCC)   Relevant Medications   carvedilol (COREG) 6.25 MG tablet   lisinopril (PRINIVIL,ZESTRIL) 30 MG tablet     Other   Hyperlipidemia   Relevant Medications   carvedilol (COREG) 6.25 MG tablet   lisinopril (PRINIVIL,ZESTRIL) 30 MG tablet      Meds ordered this encounter  Medications  . Cholecalciferol (D 1000) 1000 units capsule    Sig: Take 1 capsule by mouth daily.  . vitamin B-12 (CYANOCOBALAMIN) 250 MCG tablet    Sig: Take 1 tablet by mouth daily.  . carvedilol (COREG) 6.25 MG tablet    Sig: Take 1 tablet in AM ands 2 tablets in PM    Dispense:  90 tablet    Refill:  6  . lisinopril (PRINIVIL,ZESTRIL) 30 MG tablet    Sig: Take 1 tablet (30 mg total) by mouth daily.    Dispense:  90 tablet    Refill:  3   1. Essential hypertension  - carvedilol (COREG) 6.25 MG tablet; Take 1 tablet in AM ands 2 tablets in PM  Dispense: 90 tablet; Refill: 6 - lisinopril (PRINIVIL,ZESTRIL) 30 MG  tablet; Take 1 tablet (30 mg total) by mouth daily.  Dispense: 90 tablet; Refill: 3  2. Acute non-ST elevation myocardial infarction (NSTEMI) (HCC)  - carvedilol (COREG) 6.25 MG tablet; Take 1 tablet in AM ands 2 tablets in PM  Dispense: 90 tablet; Refill: 6  3. Acute subendocardial infarction (HCC) Cont. Plavix  4. Hyperlipidemia Cont. Simvastatin

## 2015-11-25 ENCOUNTER — Telehealth: Payer: Self-pay | Admitting: Family Medicine

## 2015-11-25 NOTE — Telephone Encounter (Signed)
Done.[place] 

## 2015-11-25 NOTE — Telephone Encounter (Signed)
Clarise Cruz from  Mail ordered called have question about pt medication. Clarise Cruz call back # is  [phone]

## 2015-12-01 ENCOUNTER — Other Ambulatory Visit: Payer: Self-pay | Admitting: *Deleted

## 2015-12-01 DIAGNOSIS — I10 Essential (primary) hypertension: Secondary | ICD-10-CM

## 2015-12-01 MED ORDER — LISINOPRIL 30 MG PO TABS: 30.0000 mg | ORAL_TABLET | Freq: Every day | ORAL | Status: DC

## 2015-12-15 DIAGNOSIS — I517 Cardiomegaly: Secondary | ICD-10-CM | POA: Diagnosis not present

## 2015-12-26 ENCOUNTER — Encounter: Payer: Self-pay | Admitting: Family Medicine

## 2015-12-26 ENCOUNTER — Ambulatory Visit (INDEPENDENT_AMBULATORY_CARE_PROVIDER_SITE_OTHER): Payer: PPO | Admitting: Family Medicine

## 2015-12-26 VITALS — BP 125/80 | HR 78 | Temp 98.3°F | Resp 16 | Ht 71.0 in | Wt 249.0 lb

## 2015-12-26 DIAGNOSIS — I259 Chronic ischemic heart disease, unspecified: Secondary | ICD-10-CM | POA: Diagnosis not present

## 2015-12-26 DIAGNOSIS — E785 Hyperlipidemia, unspecified: Secondary | ICD-10-CM | POA: Diagnosis not present

## 2015-12-26 DIAGNOSIS — Z95810 Presence of automatic (implantable) cardiac defibrillator: Secondary | ICD-10-CM | POA: Diagnosis not present

## 2015-12-26 DIAGNOSIS — I10 Essential (primary) hypertension: Secondary | ICD-10-CM

## 2015-12-26 MED ORDER — NEBIVOLOL HCL 5 MG PO TABS: 5.0000 mg | ORAL_TABLET | Freq: Every day | ORAL | Status: DC

## 2015-12-26 NOTE — Progress Notes (Signed)
Name: Jose Dunlap   MRN: [id number]    DOB: Jun 07, 1951   Date:12/26/2015       Progress Note  Subjective  Chief Complaint  Chief Complaint  Patient presents with  . Hypertension    HPI Here for f/u of HBP ands ASCVD.  He cannot tolerate higher doses of Coreg than 6.25 bid.  Hew is still a little lightheaded with that dose but does not tolerate any higher.  HR still in 70s usually.  Still taking all other meds.  He still feels extremely fatigued with the Coreg.  No problem-specific assessment & plan notes found for this encounter.   Past Medical History  Diagnosis Date  . MI (myocardial infarction) ([place])   . Essential hypertension   . Hyperlipidemia   . Pacemaker   . Shortness of breath dyspnea   . Obesity     Past Surgical History  Procedure Laterality Date  . Prostate surgery    . Pacemaker placement    . Back surgery  1986  . Abdominal hernia repair  2001  . Knee arthroscopy    . Coronary angioplasty    . Insert / replace / remove pacemaker    . Tonsillectomy    . Colonoscopy with propofol N/A 05/19/2015    Procedure: COLONOSCOPY WITH PROPOFOL;  Surgeon: Hulen Luster, MD;  Location: [facility] ENDOSCOPY;  Service: Gastroenterology;  Laterality: N/A;    History reviewed. No pertinent family history.  Social History   Social History  . Marital Status: Divorced    Spouse Name: N/A  . Number of Children: N/A  . Years of Education: N/A   Occupational History  . Not on file.   Social History Main Topics  . Smoking status: Former Research scientist (life sciences)  . Smokeless tobacco: Never Used  . Alcohol Use: Yes  . Drug Use: No  . Sexual Activity: Not on file   Other Topics Concern  . Not on file   Social History Narrative     Current outpatient prescriptions:  .  aspirin 81 MG chewable tablet, Chew 81 mg by mouth daily., Disp: , Rfl:  .  Cholecalciferol (D 1000) 1000 units capsule, Take 1 capsule by mouth daily., Disp: , Rfl:  .  clopidogrel (PLAVIX) 75 MG tablet, Take 75 mg by  mouth daily., Disp: , Rfl:  .  lisinopril (PRINIVIL,ZESTRIL) 30 MG tablet, Take 1 tablet (30 mg total) by mouth daily., Disp: 90 tablet, Rfl: 3 .  Magnesium Cl-Calcium Carbonate (SLOW MAGNESIUM/CALCIUM) 70-117 MG TBEC, Take by mouth., Disp: , Rfl:  .  Multiple Vitamin (MULTIVITAMIN) capsule, Take 1 capsule by mouth daily., Disp: , Rfl:  .  nitroGLYCERIN (NITROSTAT) 0.4 MG SL tablet, Place 0.4 mg under the tongue as needed., Disp: , Rfl:  .  simvastatin (ZOCOR) 40 MG tablet, Take 1 tablet (40 mg total) by mouth at bedtime., Disp: 90 tablet, Rfl: 3 .  spironolactone (ALDACTONE) 25 MG tablet, Take 25 mg by mouth daily., Disp: , Rfl:  .  vitamin B-12 (CYANOCOBALAMIN) 250 MCG tablet, Take 1 tablet by mouth daily., Disp: , Rfl:  .  nebivolol (BYSTOLIC) 5 MG tablet, Take 1 tablet (5 mg total) by mouth daily., Disp: 30 tablet, Rfl: 6  No Known Allergies   Review of Systems  Constitutional: Negative for fever, chills, weight loss and malaise/fatigue.  HENT: Negative for hearing loss.   Eyes: Negative for blurred vision and double vision.  Respiratory: Positive for shortness of breath (since MI.). Negative for cough and wheezing.  Cardiovascular: Negative for chest pain, palpitations and leg swelling.  Gastrointestinal: Negative for heartburn, abdominal pain and blood in stool.  Genitourinary: Negative for dysuria, urgency and frequency.  Skin: Negative for rash.  Neurological: Negative for weakness and headaches.      Objective  Filed Vitals:   12/26/15 1530 12/26/15 1603  BP: 155/84 125/80  Pulse: 72 78  Temp: 98.3 F (36.8 C)   TempSrc: Oral   Resp: 16   Height: 5\' 11"  (1.803 m)   Weight: 249 lb (112.946 kg)     Physical Exam  Constitutional: He is oriented to person, place, and time and well-developed, well-nourished, and in no distress. No distress.  HENT:  Head: Normocephalic and atraumatic.  Eyes: Conjunctivae and EOM are normal. Pupils are equal, round, and reactive to  light. No scleral icterus.  Neck: Normal range of motion. Neck supple. Carotid bruit is not present. No thyromegaly present.  Cardiovascular: Normal rate, regular rhythm and normal heart sounds.  Exam reveals no gallop and no friction rub.   No murmur heard. Pulmonary/Chest: Effort normal and breath sounds normal. No respiratory distress. He has no wheezes. He has no rales.  Abdominal: Soft. Bowel sounds are normal. He exhibits no distension, no abdominal bruit and no mass. There is no tenderness.  Musculoskeletal: He exhibits no edema.  Lymphadenopathy:    He has no cervical adenopathy.  Neurological: He is alert and oriented to person, place, and time.  Vitals reviewed.      No results found for this or any previous visit (from the past 2160 hour(s)).   Assessment & Plan  Problem List Items Addressed This Visit      Cardiovascular and Mediastinum   Chronic ischemic heart disease   Relevant Medications   nebivolol (BYSTOLIC) 5 MG tablet   Essential (primary) hypertension - Primary   Relevant Medications   nebivolol (BYSTOLIC) 5 MG tablet     Other   Hyperlipidemia   Relevant Medications   nebivolol (BYSTOLIC) 5 MG tablet   Automatic implantable cardioverter-defibrillator in situ      Meds ordered this encounter  Medications  . nebivolol (BYSTOLIC) 5 MG tablet    Sig: Take 1 tablet (5 mg total) by mouth daily.    Dispense:  30 tablet    Refill:  6   1. Essential (primary) hypertension Cont Lisinopril - nebivolol (BYSTOLIC) 5 MG tablet; Take 1 tablet (5 mg total) by mouth daily.  Dispense: 30 tablet; Refill: 6  2. Chronic ischemic heart disease Cont Plavix and ASA - nebivolol (BYSTOLIC) 5 MG tablet; Take 1 tablet (5 mg total) by mouth daily.  Dispense: 30 tablet; Refill: 6  3. Hyperlipidemia  Cont. Simvastatain 4. Automatic implantable cardioverter-defibrillator in situ

## 2016-01-31 ENCOUNTER — Encounter: Payer: Self-pay | Admitting: Family Medicine

## 2016-01-31 ENCOUNTER — Ambulatory Visit (INDEPENDENT_AMBULATORY_CARE_PROVIDER_SITE_OTHER): Payer: PPO | Admitting: Family Medicine

## 2016-01-31 VITALS — BP 130/80 | HR 72 | Temp 97.2°F | Ht 71.0 in | Wt 250.0 lb

## 2016-01-31 DIAGNOSIS — R600 Localized edema: Secondary | ICD-10-CM | POA: Diagnosis not present

## 2016-01-31 DIAGNOSIS — R7303 Prediabetes: Secondary | ICD-10-CM

## 2016-01-31 DIAGNOSIS — E785 Hyperlipidemia, unspecified: Secondary | ICD-10-CM | POA: Diagnosis not present

## 2016-01-31 DIAGNOSIS — Z95810 Presence of automatic (implantable) cardiac defibrillator: Secondary | ICD-10-CM

## 2016-01-31 DIAGNOSIS — I214 Non-ST elevation (NSTEMI) myocardial infarction: Secondary | ICD-10-CM

## 2016-01-31 DIAGNOSIS — I10 Essential (primary) hypertension: Secondary | ICD-10-CM | POA: Diagnosis not present

## 2016-01-31 DIAGNOSIS — Z950 Presence of cardiac pacemaker: Secondary | ICD-10-CM | POA: Diagnosis not present

## 2016-01-31 MED ORDER — FUROSEMIDE 20 MG PO TABS: 20.0000 mg | ORAL_TABLET | Freq: Once | ORAL | Status: DC

## 2016-01-31 MED ORDER — NITROGLYCERIN 0.4 MG SL SUBL: 0.4000 mg | SUBLINGUAL_TABLET | SUBLINGUAL | 12 refills | Status: DC | PRN

## 2016-01-31 NOTE — Progress Notes (Signed)
Name: Jose Dunlap   MRN: [id number]    DOB: February 26, 1951   Date:01/31/2016       Progress Note  Subjective  Chief Complaint  Chief Complaint  Patient presents with  . Hypertension    HPI Here for f/u of HBP.  He has had MI and has a stent in and has an artificial pacemaker.  Overall feels well but he does not tolerate the heat well.  He has swelling of legs occ. And takes a rare Lasix for pedal edema.  Sees Dr. Ubaldo Glassing in 2 months  No problem-specific Assessment & Plan notes found for this encounter.   Past Medical History:  Diagnosis Date  . Essential hypertension   . Hyperlipidemia   . MI (myocardial infarction) ([place])   . Obesity   . Pacemaker   . Shortness of breath dyspnea     Past Surgical History:  Procedure Laterality Date  . ABDOMINAL HERNIA REPAIR  2001  . BACK SURGERY  1986  . COLONOSCOPY WITH PROPOFOL N/A 05/19/2015   Procedure: COLONOSCOPY WITH PROPOFOL;  Surgeon: Hulen Luster, MD;  Location: [facility] ENDOSCOPY;  Service: Gastroenterology;  Laterality: N/A;  . CORONARY ANGIOPLASTY    . INSERT / REPLACE / REMOVE PACEMAKER    . KNEE ARTHROSCOPY    . PACEMAKER PLACEMENT    . PROSTATE SURGERY    . TONSILLECTOMY      History reviewed. No pertinent family history.  Social History   Social History  . Marital status: Divorced    Spouse name: N/A  . Number of children: N/A  . Years of education: N/A   Occupational History  . Not on file.   Social History Main Topics  . Smoking status: Former Research scientist (life sciences)  . Smokeless tobacco: Never Used  . Alcohol use Yes  . Drug use: No  . Sexual activity: Not on file   Other Topics Concern  . Not on file   Social History Narrative  . No narrative on file     Current Outpatient Prescriptions:  .  aspirin 81 MG chewable tablet, Chew 81 mg by mouth daily., Disp: , Rfl:  .  Cholecalciferol (D 1000) 1000 units capsule, Take 1 capsule by mouth daily., Disp: , Rfl:  .  clopidogrel (PLAVIX) 75 MG tablet, Take 75 mg by mouth  daily., Disp: , Rfl:  .  lisinopril (PRINIVIL,ZESTRIL) 30 MG tablet, Take 1 tablet (30 mg total) by mouth daily., Disp: 90 tablet, Rfl: 3 .  Magnesium Cl-Calcium Carbonate (SLOW MAGNESIUM/CALCIUM) 70-117 MG TBEC, Take by mouth., Disp: , Rfl:  .  Multiple Vitamin (MULTIVITAMIN) capsule, Take 1 capsule by mouth daily., Disp: , Rfl:  .  nebivolol (BYSTOLIC) 5 MG tablet, Take 1 tablet (5 mg total) by mouth daily., Disp: 30 tablet, Rfl: 6 .  nitroGLYCERIN (NITROSTAT) 0.4 MG SL tablet, Place 0.4 mg under the tongue as needed., Disp: , Rfl:  .  simvastatin (ZOCOR) 40 MG tablet, Take 1 tablet (40 mg total) by mouth at bedtime., Disp: 90 tablet, Rfl: 3 .  spironolactone (ALDACTONE) 25 MG tablet, Take 25 mg by mouth daily., Disp: , Rfl:  .  vitamin B-12 (CYANOCOBALAMIN) 250 MCG tablet, Take 1 tablet by mouth daily., Disp: , Rfl:   Not on File   Review of Systems  Constitutional: Negative for chills, fever, malaise/fatigue and weight loss.  HENT: Negative for hearing loss.   Eyes: Negative for blurred vision and double vision.  Respiratory: Negative for cough, shortness of breath and wheezing.  Cardiovascular: Negative for chest pain, palpitations and leg swelling.  Gastrointestinal: Negative for abdominal pain, blood in stool and heartburn.  Genitourinary: Negative for dysuria, frequency and urgency.  Musculoskeletal: Negative for back pain and myalgias.  Skin: Negative for rash.  Neurological: Negative for dizziness, tremors and weakness.      Objective  Vitals:   01/31/16 0903 01/31/16 0934  BP: (!) 145/88 130/80  Pulse: 69 72  Temp: 97.2 F (36.2 C)   TempSrc: Oral   Weight: 250 lb (113.4 kg)   Height: 5\' 11"  (1.803 m)     Physical Exam  Constitutional: He is oriented to person, place, and time and well-developed, well-nourished, and in no distress. No distress.  HENT:  Head: Normocephalic and atraumatic.  Eyes: Conjunctivae and EOM are normal. Pupils are equal, round, and  reactive to light. No scleral icterus.  Neck: Normal range of motion. Neck supple. Carotid bruit is not present. No thyromegaly present.  Cardiovascular: Normal rate, regular rhythm and normal heart sounds.  Exam reveals no gallop and no friction rub.   No murmur heard. Pulmonary/Chest: Effort normal and breath sounds normal. No respiratory distress. He has no wheezes. He has no rales.  Abdominal: Soft. Bowel sounds are normal. He exhibits no distension, no abdominal bruit and no mass. There is no tenderness.  Musculoskeletal: He exhibits no edema.  Lymphadenopathy:    He has no cervical adenopathy.  Neurological: He is alert and oriented to person, place, and time.       No results found for this or any previous visit (from the past 2160 hour(s)).   Assessment & Plan  Problem List Items Addressed This Visit      Cardiovascular and Mediastinum   Acute non-ST elevation myocardial infarction (NSTEMI) ([place])   Essential (primary) hypertension - Primary     Other   Hyperlipidemia   Automatic implantable cardioverter-defibrillator in situ   Artificial cardiac pacemaker   Pedal edema   Pre-diabetes    Other Visit Diagnoses   None.     No orders of the defined types were placed in this encounter.  1. Essential (primary) hypertension  - COMPLETE METABOLIC PANEL WITH GFR - CBC with Differential Cont Aldactone, Liksinopril 2. Acute non-ST elevation myocardial infarction (NSTEMI) (HCC) Cont Bysatolic Use NTG prn Cont Plavix 3. Artificial cardiac pacemaker   4. Automatic implantable cardioverter-defibrillator in situ   5. Hyperlipidemia  - Lipid Profile Cont Zocor 6. Pedal edema  - furosemide (LASIX) tablet 20 mg; Take 1 tablet (20 mg total) by mouth once.  7. Pre-diabetes  - HgB A1c Watch diet.

## 2016-02-03 ENCOUNTER — Other Ambulatory Visit: Payer: Self-pay | Admitting: *Deleted

## 2016-02-03 MED ORDER — NITROGLYCERIN 0.4 MG SL SUBL: 0.4000 mg | SUBLINGUAL_TABLET | SUBLINGUAL | 4 refills | Status: DC | PRN

## 2016-02-06 ENCOUNTER — Other Ambulatory Visit: Payer: PPO

## 2016-02-06 DIAGNOSIS — E785 Hyperlipidemia, unspecified: Secondary | ICD-10-CM | POA: Diagnosis not present

## 2016-02-06 DIAGNOSIS — R7303 Prediabetes: Secondary | ICD-10-CM | POA: Diagnosis not present

## 2016-02-06 DIAGNOSIS — I10 Essential (primary) hypertension: Secondary | ICD-10-CM | POA: Diagnosis not present

## 2016-02-07 LAB — CBC WITH DIFFERENTIAL/PLATELET
Basophils Absolute: 0 cells/uL (ref 0–200)
Basophils Relative: 0 %
EOS PCT: 2 %
Eosinophils Absolute: 186 cells/uL (ref 15–500)
HCT: 44.8 % (ref 38.5–50.0)
HEMOGLOBIN: 15.2 g/dL (ref 13.2–17.1)
LYMPHS ABS: 1767 {cells}/uL (ref 850–3900)
LYMPHS PCT: 19 %
MCH: 29.9 pg (ref 27.0–33.0)
MCHC: 33.9 g/dL (ref 32.0–36.0)
MCV: 88 fL (ref 80.0–100.0)
MPV: 11.3 fL (ref 7.5–12.5)
Monocytes Absolute: 651 cells/uL (ref 200–950)
Monocytes Relative: 7 %
NEUTROS PCT: 72 %
Neutro Abs: 6696 cells/uL (ref 1500–7800)
Platelets: 185 10*3/uL (ref 140–400)
RBC: 5.09 MIL/uL (ref 4.20–5.80)
RDW: 14.6 % (ref 11.0–15.0)
WBC: 9.3 10*3/uL (ref 3.8–10.8)

## 2016-02-07 LAB — COMPLETE METABOLIC PANEL WITH GFR
ALT: 30 U/L (ref 9–46)
AST: 20 U/L (ref 10–35)
Albumin: 4.3 g/dL (ref 3.6–5.1)
Alkaline Phosphatase: 64 U/L (ref 40–115)
BILIRUBIN TOTAL: 0.9 mg/dL (ref 0.2–1.2)
BUN: 21 mg/dL (ref 7–25)
CO2: 24 mmol/L (ref 20–31)
Calcium: 9.4 mg/dL (ref 8.6–10.3)
Chloride: 100 mmol/L (ref 98–110)
Creat: 1.18 mg/dL (ref 0.70–1.25)
GFR, EST NON AFRICAN AMERICAN: 64 mL/min (ref >=60)
GFR, Est African American: 74 mL/min (ref >=60)
GLUCOSE: 101 mg/dL — AB (ref 65–99)
Potassium: 5.2 mmol/L (ref 3.5–5.3)
SODIUM: 138 mmol/L (ref 135–146)
TOTAL PROTEIN: 6.5 g/dL (ref 6.1–8.1)

## 2016-02-07 LAB — HEMOGLOBIN A1C
Hgb A1c MFr Bld: 5.7 % — ABNORMAL HIGH (ref <5.7)
MEAN PLASMA GLUCOSE: 117 mg/dL

## 2016-02-07 LAB — LIPID PANEL
Cholesterol: 161 mg/dL (ref 125–200)
HDL: 38 mg/dL — AB (ref >=40)
LDL CALC: 62 mg/dL (ref <130)
Total CHOL/HDL Ratio: 4.2 Ratio (ref <=5.0)
Triglycerides: 305 mg/dL — ABNORMAL HIGH (ref <150)
VLDL: 61 mg/dL — ABNORMAL HIGH (ref <30)

## 2016-02-09 ENCOUNTER — Encounter: Payer: Self-pay | Admitting: Family Medicine

## 2016-02-14 ENCOUNTER — Other Ambulatory Visit: Payer: Self-pay | Admitting: Family Medicine

## 2016-02-14 DIAGNOSIS — I10 Essential (primary) hypertension: Secondary | ICD-10-CM

## 2016-02-14 DIAGNOSIS — I259 Chronic ischemic heart disease, unspecified: Secondary | ICD-10-CM

## 2016-02-14 MED ORDER — FUROSEMIDE 20 MG PO TABS: 20.0000 mg | ORAL_TABLET | Freq: Every day | ORAL | 1 refills | Status: DC

## 2016-02-14 MED ORDER — NEBIVOLOL HCL 5 MG PO TABS: 5.0000 mg | ORAL_TABLET | Freq: Every day | ORAL | 3 refills | Status: DC

## 2016-04-05 DIAGNOSIS — Z95810 Presence of automatic (implantable) cardiac defibrillator: Secondary | ICD-10-CM | POA: Diagnosis not present

## 2016-04-05 DIAGNOSIS — I259 Chronic ischemic heart disease, unspecified: Secondary | ICD-10-CM | POA: Diagnosis not present

## 2016-04-05 DIAGNOSIS — I517 Cardiomegaly: Secondary | ICD-10-CM | POA: Diagnosis not present

## 2016-04-05 DIAGNOSIS — I10 Essential (primary) hypertension: Secondary | ICD-10-CM | POA: Diagnosis not present

## 2016-04-05 DIAGNOSIS — I214 Non-ST elevation (NSTEMI) myocardial infarction: Secondary | ICD-10-CM | POA: Diagnosis not present

## 2016-04-05 DIAGNOSIS — E782 Mixed hyperlipidemia: Secondary | ICD-10-CM | POA: Diagnosis not present

## 2016-04-05 DIAGNOSIS — I255 Ischemic cardiomyopathy: Secondary | ICD-10-CM | POA: Diagnosis not present

## 2016-05-15 ENCOUNTER — Ambulatory Visit (INDEPENDENT_AMBULATORY_CARE_PROVIDER_SITE_OTHER): Payer: PPO | Admitting: *Deleted

## 2016-05-15 DIAGNOSIS — Z23 Encounter for immunization: Secondary | ICD-10-CM

## 2016-06-14 DIAGNOSIS — I517 Cardiomegaly: Secondary | ICD-10-CM | POA: Diagnosis not present

## 2016-08-07 ENCOUNTER — Ambulatory Visit (INDEPENDENT_AMBULATORY_CARE_PROVIDER_SITE_OTHER): Payer: PPO | Admitting: Family Medicine

## 2016-08-07 ENCOUNTER — Encounter: Payer: Self-pay | Admitting: Family Medicine

## 2016-08-07 VITALS — BP 140/80 | HR 67 | Temp 98.4°F | Resp 16 | Ht 69.0 in | Wt 251.0 lb

## 2016-08-07 DIAGNOSIS — E784 Other hyperlipidemia: Secondary | ICD-10-CM | POA: Diagnosis not present

## 2016-08-07 DIAGNOSIS — E7849 Other hyperlipidemia: Secondary | ICD-10-CM

## 2016-08-07 DIAGNOSIS — R7303 Prediabetes: Secondary | ICD-10-CM

## 2016-08-07 DIAGNOSIS — I10 Essential (primary) hypertension: Secondary | ICD-10-CM | POA: Diagnosis not present

## 2016-08-07 DIAGNOSIS — I214 Non-ST elevation (NSTEMI) myocardial infarction: Secondary | ICD-10-CM | POA: Diagnosis not present

## 2016-08-07 DIAGNOSIS — I259 Chronic ischemic heart disease, unspecified: Secondary | ICD-10-CM | POA: Diagnosis not present

## 2016-08-07 MED ORDER — LISINOPRIL 30 MG PO TABS: 30.0000 mg | ORAL_TABLET | Freq: Every day | ORAL | 3 refills | Status: DC

## 2016-08-07 MED ORDER — NEBIVOLOL HCL 5 MG PO TABS: 5.0000 mg | ORAL_TABLET | Freq: Every day | ORAL | 3 refills | Status: DC

## 2016-08-07 MED ORDER — EZETIMIBE 10 MG PO TABS: 10.0000 mg | ORAL_TABLET | Freq: Every day | ORAL | 6 refills | Status: DC

## 2016-08-07 MED ORDER — SPIRONOLACTONE 25 MG PO TABS: 25.0000 mg | ORAL_TABLET | Freq: Every day | ORAL | 3 refills | Status: DC

## 2016-08-07 MED ORDER — CLOPIDOGREL BISULFATE 75 MG PO TABS: 75.0000 mg | ORAL_TABLET | Freq: Every day | ORAL | 3 refills | Status: DC

## 2016-08-07 NOTE — Progress Notes (Signed)
Name: Jose Dunlap   MRN: [id number]    DOB: [birth year]   Date:08/07/2016       Progress Note  Subjective  Chief Complaint  Chief Complaint  Patient presents with  . Hypertension  . Hyperlipidemia  . Hyperglycemia  . Heart Problem    HPI Here for f/u of HBP.  Has ASCVD ans sees Dr. Ubaldo Glassing.  His heart is doing ok.  He has stopped his.Statin because of myalgias.  Taking his other meds and has no c/o. No problem-specific Assessment & Plan notes found for this encounter.   Past Medical History:  Diagnosis Date  . Essential hypertension   . Hyperlipidemia   . MI (myocardial infarction)   . Obesity   . Pacemaker   . Shortness of breath dyspnea     Past Surgical History:  Procedure Laterality Date  . ABDOMINAL HERNIA REPAIR  2001  . BACK SURGERY  1986  . COLONOSCOPY WITH PROPOFOL N/A 05/19/2015   Procedure: COLONOSCOPY WITH PROPOFOL;  Surgeon: Hulen Luster, MD;  Location: [facility] ENDOSCOPY;  Service: Gastroenterology;  Laterality: N/A;  . CORONARY ANGIOPLASTY    . INSERT / REPLACE / REMOVE PACEMAKER    . KNEE ARTHROSCOPY    . PACEMAKER PLACEMENT    . PROSTATE SURGERY    . TONSILLECTOMY      History reviewed. No pertinent family history.  Social History   Social History  . Marital status: Divorced    Spouse name: N/A  . Number of children: N/A  . Years of education: N/A   Occupational History  . Not on file.   Social History Main Topics  . Smoking status: Former Research scientist (life sciences)  . Smokeless tobacco: Never Used  . Alcohol use Yes  . Drug use: No  . Sexual activity: Not on file   Other Topics Concern  . Not on file   Social History Narrative  . No narrative on file     Current Outpatient Prescriptions:  .  aspirin 81 MG chewable tablet, Chew 81 mg by mouth daily., Disp: , Rfl:  .  Cholecalciferol (D 1000) 1000 units capsule, Take 1 capsule by mouth daily., Disp: , Rfl:  .  furosemide (LASIX) 20 MG tablet, Take 1 tablet (20 mg total) by mouth daily. (Patient taking  differently: Take 20 mg by mouth daily. Used rarely, as needed.), Disp: 90 tablet, Rfl: 1 .  lisinopril (PRINIVIL,ZESTRIL) 30 MG tablet, Take 1 tablet (30 mg total) by mouth daily., Disp: 90 tablet, Rfl: 3 .  Magnesium Cl-Calcium Carbonate (SLOW MAGNESIUM/CALCIUM) 70-117 MG TBEC, Take by mouth., Disp: , Rfl:  .  Multiple Vitamin (MULTIVITAMIN) capsule, Take 1 capsule by mouth daily., Disp: , Rfl:  .  nebivolol (BYSTOLIC) 5 MG tablet, Take 1 tablet (5 mg total) by mouth daily., Disp: 90 tablet, Rfl: 3 .  nitroGLYCERIN (NITROSTAT) 0.4 MG SL tablet, Place 1 tablet (0.4 mg total) under the tongue as needed., Disp: 25 tablet, Rfl: 4 .  spironolactone (ALDACTONE) 25 MG tablet, Take 1 tablet (25 mg total) by mouth daily., Disp: 90 tablet, Rfl: 3 .  vitamin B-12 (CYANOCOBALAMIN) 250 MCG tablet, Take 1 tablet by mouth daily., Disp: , Rfl:  .  clopidogrel (PLAVIX) 75 MG tablet, Take 1 tablet (75 mg total) by mouth daily., Disp: 90 tablet, Rfl: 3 .  ezetimibe (ZETIA) 10 MG tablet, Take 1 tablet (10 mg total) by mouth daily., Disp: 30 tablet, Rfl: 6  Not on File   Review of Systems  Constitutional:  Negative for chills, fever, malaise/fatigue and weight loss.  HENT: Negative for hearing loss and tinnitus.   Eyes: Negative for blurred vision and double vision.  Respiratory: Negative for cough, shortness of breath and wheezing.   Cardiovascular: Negative for chest pain, palpitations and leg swelling.  Gastrointestinal: Negative for abdominal pain, blood in stool and heartburn.  Genitourinary: Negative for dysuria, frequency and urgency.  Musculoskeletal: Negative for joint pain and myalgias.  Skin: Negative for rash.  Neurological: Negative for dizziness, tingling, tremors, weakness and headaches.      Objective  Vitals:   08/07/16 0851 08/07/16 0921  BP: 134/76 140/80  Pulse: 67   Resp: 16   Temp: 98.4 F (36.9 C)   TempSrc: Oral   Weight: 251 lb (113.9 kg)   Height: 5\' 9"  (1.753 m)      Physical Exam  Constitutional: He is oriented to person, place, and time and well-developed, well-nourished, and in no distress. No distress.  HENT:  Head: Normocephalic and atraumatic.  Eyes: Conjunctivae and EOM are normal. Pupils are equal, round, and reactive to light. No scleral icterus.  Neck: Normal range of motion. Neck supple. Carotid bruit is not present. No thyromegaly present.  Cardiovascular: Normal rate, regular rhythm and normal heart sounds.  Exam reveals no gallop and no friction rub.   No murmur heard. Pulmonary/Chest: Effort normal and breath sounds normal. No respiratory distress. He has no wheezes. He has no rales.  Abdominal: Soft. Bowel sounds are normal. He exhibits no distension and no mass. There is no tenderness.  Musculoskeletal: He exhibits no edema.  Lymphadenopathy:    He has no cervical adenopathy.  Neurological: He is alert and oriented to person, place, and time.  Vitals reviewed.      No results found for this or any previous visit (from the past 2160 hour(s)).   Assessment & Plan  Problem List Items Addressed This Visit      Cardiovascular and Mediastinum   HBP (high blood pressure)   Relevant Medications   lisinopril (PRINIVIL,ZESTRIL) 30 MG tablet   nebivolol (BYSTOLIC) 5 MG tablet   spironolactone (ALDACTONE) 25 MG tablet   ezetimibe (ZETIA) 10 MG tablet   Chronic ischemic heart disease   Relevant Medications   lisinopril (PRINIVIL,ZESTRIL) 30 MG tablet   nebivolol (BYSTOLIC) 5 MG tablet   spironolactone (ALDACTONE) 25 MG tablet   ezetimibe (ZETIA) 10 MG tablet   Acute non-ST elevation myocardial infarction (NSTEMI) (HCC)   Relevant Medications   clopidogrel (PLAVIX) 75 MG tablet   lisinopril (PRINIVIL,ZESTRIL) 30 MG tablet   nebivolol (BYSTOLIC) 5 MG tablet   spironolactone (ALDACTONE) 25 MG tablet   ezetimibe (ZETIA) 10 MG tablet   Essential (primary) hypertension - Primary   Relevant Medications   lisinopril  (PRINIVIL,ZESTRIL) 30 MG tablet   nebivolol (BYSTOLIC) 5 MG tablet   spironolactone (ALDACTONE) 25 MG tablet   ezetimibe (ZETIA) 10 MG tablet   Other Relevant Orders   COMPLETE METABOLIC PANEL WITH GFR   CBC with Differential     Other   Hyperlipidemia   Relevant Medications   lisinopril (PRINIVIL,ZESTRIL) 30 MG tablet   nebivolol (BYSTOLIC) 5 MG tablet   spironolactone (ALDACTONE) 25 MG tablet   ezetimibe (ZETIA) 10 MG tablet   Other Relevant Orders   Lipid Profile   Pre-diabetes   Relevant Orders   HgB A1c      Meds ordered this encounter  Medications  . DISCONTD: clopidogrel (PLAVIX) 75 MG tablet    Sig:  Take 75 mg by mouth daily.  . clopidogrel (PLAVIX) 75 MG tablet    Sig: Take 1 tablet (75 mg total) by mouth daily.    Dispense:  90 tablet    Refill:  3  . lisinopril (PRINIVIL,ZESTRIL) 30 MG tablet    Sig: Take 1 tablet (30 mg total) by mouth daily.    Dispense:  90 tablet    Refill:  3  . nebivolol (BYSTOLIC) 5 MG tablet    Sig: Take 1 tablet (5 mg total) by mouth daily.    Dispense:  90 tablet    Refill:  3  . spironolactone (ALDACTONE) 25 MG tablet    Sig: Take 1 tablet (25 mg total) by mouth daily.    Dispense:  90 tablet    Refill:  3  . ezetimibe (ZETIA) 10 MG tablet    Sig: Take 1 tablet (10 mg total) by mouth daily.    Dispense:  30 tablet    Refill:  6   1. Essential (primary) hypertension  - COMPLETE METABOLIC PANEL WITH GFR - CBC with Differential - nebivolol (BYSTOLIC) 5 MG tablet; Take 1 tablet (5 mg total) by mouth daily.  Dispense: 90 tablet; Refill: 3 - spironolactone (ALDACTONE) 25 MG tablet; Take 1 tablet (25 mg total) by mouth daily.  Dispense: 90 tablet; Refill: 3  2. Acute non-ST elevation myocardial infarction (NSTEMI) (HCC)  - clopidogrel (PLAVIX) 75 MG tablet; Take 1 tablet (75 mg total) by mouth daily.  Dispense: 90 tablet; Refill: 3  3. Pre-diabetes  - HgB A1c  4. Other hyperlipidemia  - Lipid Profile - ezetimibe (ZETIA)  10 MG tablet; Take 1 tablet (10 mg total) by mouth daily.  Dispense: 30 tablet; Refill: 6  5. Essential hypertension  - lisinopril (PRINIVIL,ZESTRIL) 30 MG tablet; Take 1 tablet (30 mg total) by mouth daily.  Dispense: 90 tablet; Refill: 3  6. Chronic ischemic heart disease - nebivolol (BYSTOLIC) 5 MG tablet; Take 1 tablet (5 mg total) by mouth daily.  Dispense: 90 tablet; Refill: 3

## 2016-08-10 ENCOUNTER — Other Ambulatory Visit: Payer: PPO

## 2016-08-10 DIAGNOSIS — E784 Other hyperlipidemia: Secondary | ICD-10-CM | POA: Diagnosis not present

## 2016-08-10 DIAGNOSIS — I10 Essential (primary) hypertension: Secondary | ICD-10-CM | POA: Diagnosis not present

## 2016-08-10 DIAGNOSIS — R7303 Prediabetes: Secondary | ICD-10-CM | POA: Diagnosis not present

## 2016-08-10 LAB — CBC WITH DIFFERENTIAL/PLATELET
BASOS ABS: 0 {cells}/uL (ref 0–200)
Basophils Relative: 0 %
EOS ABS: 237 {cells}/uL (ref 15–500)
Eosinophils Relative: 3 %
HCT: 46.4 % (ref 38.5–50.0)
HEMOGLOBIN: 15.8 g/dL (ref 13.2–17.1)
Lymphocytes Relative: 19 %
Lymphs Abs: 1501 cells/uL (ref 850–3900)
MCH: 29.9 pg (ref 27.0–33.0)
MCHC: 34.1 g/dL (ref 32.0–36.0)
MCV: 87.7 fL (ref 80.0–100.0)
MPV: 11.2 fL (ref 7.5–12.5)
Monocytes Absolute: 474 cells/uL (ref 200–950)
Monocytes Relative: 6 %
NEUTROS ABS: 5688 {cells}/uL (ref 1500–7800)
NEUTROS PCT: 72 %
Platelets: 193 10*3/uL (ref 140–400)
RBC: 5.29 MIL/uL (ref 4.20–5.80)
RDW: 15.2 % — ABNORMAL HIGH (ref 11.0–15.0)
WBC: 7.9 10*3/uL (ref 3.8–10.8)

## 2016-08-10 LAB — HEMOGLOBIN A1C
HEMOGLOBIN A1C: 5.5 % (ref <5.7)
MEAN PLASMA GLUCOSE: 111 mg/dL

## 2016-08-11 LAB — LIPID PANEL
CHOL/HDL RATIO: 6.2 ratio — AB (ref <5.0)
Cholesterol: 204 mg/dL — ABNORMAL HIGH (ref <200)
HDL: 33 mg/dL — ABNORMAL LOW (ref >40)
LDL Cholesterol: 103 mg/dL — ABNORMAL HIGH (ref <100)
Triglycerides: 339 mg/dL — ABNORMAL HIGH (ref <150)
VLDL: 68 mg/dL — AB (ref <30)

## 2016-08-11 LAB — COMPLETE METABOLIC PANEL WITH GFR
ALK PHOS: 56 U/L (ref 40–115)
ALT: 27 U/L (ref 9–46)
AST: 17 U/L (ref 10–35)
Albumin: 4.4 g/dL (ref 3.6–5.1)
BILIRUBIN TOTAL: 1.3 mg/dL — AB (ref 0.2–1.2)
BUN: 16 mg/dL (ref 7–25)
CO2: 26 mmol/L (ref 20–31)
Calcium: 9.3 mg/dL (ref 8.6–10.3)
Chloride: 102 mmol/L (ref 98–110)
Creat: 0.87 mg/dL (ref 0.70–1.25)
GFR, Est African American: >89 mL/min (ref >=60)
GLUCOSE: 102 mg/dL — AB (ref 65–99)
Potassium: 4.6 mmol/L (ref 3.5–5.3)
SODIUM: 136 mmol/L (ref 135–146)
Total Protein: 7 g/dL (ref 6.1–8.1)

## 2016-10-02 DIAGNOSIS — I255 Ischemic cardiomyopathy: Secondary | ICD-10-CM | POA: Diagnosis not present

## 2016-10-02 DIAGNOSIS — I214 Non-ST elevation (NSTEMI) myocardial infarction: Secondary | ICD-10-CM | POA: Diagnosis not present

## 2016-10-02 DIAGNOSIS — I10 Essential (primary) hypertension: Secondary | ICD-10-CM | POA: Diagnosis not present

## 2016-11-01 ENCOUNTER — Emergency Department
Admission: EM | Admit: 2016-11-01 | Discharge: 2016-11-01 | Disposition: A | Discharge status: Home / Self Care | Payer: PPO | Attending: Emergency Medicine | Admitting: Emergency Medicine

## 2016-11-01 ENCOUNTER — Encounter: Payer: Self-pay | Admitting: Medical Oncology

## 2016-11-01 ENCOUNTER — Emergency Department: Payer: PPO

## 2016-11-01 DIAGNOSIS — I10 Essential (primary) hypertension: Secondary | ICD-10-CM | POA: Diagnosis not present

## 2016-11-01 DIAGNOSIS — R55 Syncope and collapse: Secondary | ICD-10-CM | POA: Insufficient documentation

## 2016-11-01 DIAGNOSIS — I252 Old myocardial infarction: Secondary | ICD-10-CM | POA: Insufficient documentation

## 2016-11-01 DIAGNOSIS — Z79899 Other long term (current) drug therapy: Secondary | ICD-10-CM | POA: Insufficient documentation

## 2016-11-01 DIAGNOSIS — I517 Cardiomegaly: Secondary | ICD-10-CM | POA: Diagnosis not present

## 2016-11-01 DIAGNOSIS — Z95810 Presence of automatic (implantable) cardiac defibrillator: Secondary | ICD-10-CM | POA: Insufficient documentation

## 2016-11-01 DIAGNOSIS — R42 Dizziness and giddiness: Secondary | ICD-10-CM | POA: Diagnosis not present

## 2016-11-01 DIAGNOSIS — Z87891 Personal history of nicotine dependence: Secondary | ICD-10-CM | POA: Insufficient documentation

## 2016-11-01 DIAGNOSIS — Z7982 Long term (current) use of aspirin: Secondary | ICD-10-CM | POA: Diagnosis not present

## 2016-11-01 LAB — HEPATIC FUNCTION PANEL
ALBUMIN: 4.2 g/dL (ref 3.5–5.0)
ALT: 32 U/L (ref 17–63)
AST: 24 U/L (ref 15–41)
Alkaline Phosphatase: 61 U/L (ref 38–126)
BILIRUBIN TOTAL: 1.1 mg/dL (ref 0.3–1.2)
Total Protein: 7.2 g/dL (ref 6.5–8.1)

## 2016-11-01 LAB — URINALYSIS, COMPLETE (UACMP) WITH MICROSCOPIC
BACTERIA UA: NONE SEEN
BILIRUBIN URINE: NEGATIVE
Glucose, UA: NEGATIVE mg/dL
Hgb urine dipstick: NEGATIVE
KETONES UR: NEGATIVE mg/dL
LEUKOCYTES UA: NEGATIVE
Nitrite: NEGATIVE
PH: 7 (ref 5.0–8.0)
Protein, ur: NEGATIVE mg/dL
Specific Gravity, Urine: 1.005 (ref 1.005–1.030)
Squamous Epithelial / LPF: NONE SEEN

## 2016-11-01 LAB — BASIC METABOLIC PANEL
ANION GAP: 10 (ref 5–15)
BUN: 17 mg/dL (ref 6–20)
CO2: 27 mmol/L (ref 22–32)
Calcium: 9.4 mg/dL (ref 8.9–10.3)
Chloride: 99 mmol/L — ABNORMAL LOW (ref 101–111)
Creatinine, Ser: 0.85 mg/dL (ref 0.61–1.24)
Glucose, Bld: 109 mg/dL — ABNORMAL HIGH (ref 65–99)
POTASSIUM: 4.4 mmol/L (ref 3.5–5.1)
SODIUM: 136 mmol/L (ref 135–145)

## 2016-11-01 LAB — CBC
HEMATOCRIT: 47.6 % (ref 40.0–52.0)
HEMOGLOBIN: 16.3 g/dL (ref 13.0–18.0)
MCH: 29.8 pg (ref 26.0–34.0)
MCHC: 34.2 g/dL (ref 32.0–36.0)
MCV: 87.2 fL (ref 80.0–100.0)
Platelets: 188 10*3/uL (ref 150–440)
RBC: 5.45 MIL/uL (ref 4.40–5.90)
RDW: 14.3 % (ref 11.5–14.5)
WBC: 8.7 10*3/uL (ref 3.8–10.6)

## 2016-11-01 MED ORDER — SODIUM CHLORIDE 0.9 % IV SOLN
Freq: Once | INTRAVENOUS | Status: AC
  Administered 2016-11-01: 09:00:00 via INTRAVENOUS

## 2016-11-01 NOTE — ED Notes (Signed)
Dr. Jimmye Norman notified of clear results from [place] interrogation

## 2016-11-01 NOTE — ED Triage Notes (Signed)
Pt reports that he began last night having some dizziness, this am he checked his BP and noted it to be "high". Pt reports that he has been taking home meds as prescribed. Denies chest pain. Denies sob.

## 2016-11-01 NOTE — ED Provider Notes (Signed)
[facility] Emergency Department Provider Note       Time seen: ----------------------------------------- 9:01 AM on 11/01/2016 -----------------------------------------     I have reviewed the triage vital signs and the nursing notes.   HISTORY   Chief Complaint Hypertension and Dizziness    HPI Jose Dunlap is a 66 y.o. male who presents to the ED for dizziness and lightheadedness. Patient states he felt like he was given a pass out. Patient reports last night he started having the symptoms, this morning he checks his blood pressure and he noted it to be elevated. Patient reports she's been taking all his medications as prescribed. He denies fevers, chills, chest pain, shortness of breath, vomiting or diarrhea. He denies any changes in his medicines.   Past Medical History:  Diagnosis Date  . Essential hypertension   . Hyperlipidemia   . MI (myocardial infarction) ([place])   . Obesity   . Pacemaker   . Shortness of breath dyspnea     Patient Active Problem List   Diagnosis Date Noted  . Pedal edema 01/31/2016  . Pre-diabetes 01/31/2016  . Neuralgia of flank 06/23/2015  . HBP (high blood pressure) 04/19/2015  . Hyperlipidemia 04/19/2015  . MI, old 04/19/2015  . Artificial pacemaker 04/19/2015  . Cardiac defibrillator in place 04/19/2015  . Weight gain 04/19/2015  . Abnormal weight gain 04/19/2015  . Essential (primary) hypertension 04/19/2015  . Healed myocardial infarct 04/19/2015  . Artificial cardiac pacemaker 04/19/2015  . Incomplete bladder emptying 04/08/2014  . 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 Benign prostatic hyperplasia with urinary obstruction 01/21/2014  . Frank hematuria 01/21/2014  . Bladder retention 01/21/2014  . Acute cystitis 01/21/2014  . Benign localized hyperplasia of prostate with urinary obstruction and other lower urinary tract symptoms (LUTS)(600.21) 01/21/2014   . Automatic implantable cardioverter-defibrillator in situ 12/23/2013  . Lower urinary tract infection 12/23/2013  . History of surgical procedure 12/23/2013  . Infection of urinary tract 12/23/2013  . Acute subendocardial infarction ([place]) 10/15/2013  . Chronic ischemic heart disease 10/15/2013  . Hemorrhoid 10/15/2013  . Acute non-ST elevation myocardial infarction (NSTEMI) ([place]) 10/15/2013  . Ischemic cardiomyopathy 10/15/2013    Past Surgical History:  Procedure Laterality Date  . ABDOMINAL HERNIA REPAIR  2001  . BACK SURGERY  1986  . COLONOSCOPY WITH PROPOFOL N/A 05/19/2015   Procedure: COLONOSCOPY WITH PROPOFOL;  Surgeon: Hulen Luster, MD;  Location: [facility] ENDOSCOPY;  Service: Gastroenterology;  Laterality: N/A;  . CORONARY ANGIOPLASTY    . INSERT / REPLACE / REMOVE PACEMAKER    . KNEE ARTHROSCOPY    . PACEMAKER PLACEMENT    . PROSTATE SURGERY    . TONSILLECTOMY      Allergies Patient has no known allergies.  Social History Social History  Substance Use Topics  . Smoking status: Former Research scientist (life sciences)  . Smokeless tobacco: Never Used  . Alcohol use Yes    Review of Systems Constitutional: Negative for fever. Eyes: Negative for vision changes ENT:  Negative for congestion, sore throat Cardiovascular: Negative for chest pain. Respiratory: Negative for shortness of breath. Gastrointestinal: Negative for abdominal pain, vomiting and diarrhea. Genitourinary: Negative for dysuria. Musculoskeletal: Negative for back pain. Skin: Negative for rash. Neurological: Negative for headaches, focal weakness or numbness.Positive for dizziness and lightheadedness  All systems negative/normal/unremarkable except as stated in the HPI  ____________________________________________   PHYSICAL EXAM:  VITAL SIGNS: ED Triage Vitals [11/01/16 0836]  Enc Vitals Group  BP (!) 187/94     Pulse Rate 66     Resp 20     Temp 97.6 F (36.4 C)     Temp Source Oral     SpO2 98 %     Weight  250 lb (113.4 kg)     Height 5\' 11"  (1.803 m)     Head Circumference      Peak Flow      Pain Score      Pain Loc      Pain Edu?      Excl. in [place]?     Constitutional: Alert and oriented. Well appearing and in no distress. Eyes: Conjunctivae are Mildly injected bilaterally. PERRL. Normal extraocular movements. ENT   Head: Normocephalic and atraumatic.   Nose: No congestion/rhinnorhea.   Mouth/Throat: Mucous membranes are moist.   Neck: No stridor. Cardiovascular: Normal rate, regular rhythm. No murmurs, rubs, or gallops. Respiratory: Normal respiratory effort without tachypnea nor retractions. Breath sounds are clear and equal bilaterally. No wheezes/rales/rhonchi. Gastrointestinal: Soft and nontender. Normal bowel sounds Musculoskeletal: Nontender with normal range of motion in extremities. No lower extremity tenderness nor edema. Neurologic:  Normal speech and language. No gross focal neurologic deficits are appreciated.  Skin:  Skin is warm, dry and intact. No rash noted. Psychiatric: Mood and affect are normal. Speech and behavior are normal.  ____________________________________________  EKG: Interpreted by me. Atrial sensed ventricular paced rhythm with a rate of 63 bpm, normal pacemaker activity  ____________________________________________  ED COURSE:  Pertinent labs & imaging results that were available during my care of the patient were reviewed by me and considered in my medical decision making (see chart for details). Patient presents for dizziness and lightheadedness, we will assess with labs and imaging as indicated. Clinical Course as of Nov 02 1114  Thu Nov 01, 2016  0948 Patient was not orthostatic on examination  [JW]    Clinical Course User Index [JW] Earleen Newport, MD   Procedures ____________________________________________   LABS (pertinent positives/negatives)  Labs Reviewed  BASIC METABOLIC PANEL - Abnormal; Notable for the  following:       Result Value   Chloride 99 (*)    Glucose, Bld 109 (*)    All other components within normal limits  URINALYSIS, COMPLETE (UACMP) WITH MICROSCOPIC - Abnormal; Notable for the following:    Color, Urine YELLOW (*)    APPearance CLEAR (*)    All other components within normal limits  HEPATIC FUNCTION PANEL - Abnormal; Notable for the following:    Bilirubin, Direct <0.1 (*)    All other components within normal limits  CBC    RADIOLOGY Images were viewed by me  Chest x-ray IMPRESSION: Cardiomegaly with mild pulmonary vascular congestion. ____________________________________________  FINAL ASSESSMENT AND PLAN  Dizziness, lightheadedness  Plan: Patient's labs and imaging were dictated above. Patient had presented for Dizziness of uncertain etiology. He was not orthostatic and his workup here is negative. His pacemaker was interrogated and was normal. No clear etiology for his symptoms, I offered admission to the patient but he declined multiple times. I advised I cannot explain what happened to him. Patient understands and rather go home at this time.   Earleen Newport, MD   Note: This note was generated in part or whole with voice recognition software. Voice recognition is usually quite accurate but there are transcription errors that can and very often do occur. I apologize for any typographical errors that were not detected and corrected.  Earleen Newport, MD 11/01/16 [phone]

## 2016-11-01 NOTE — ED Notes (Signed)
Pt resting in bed, fiancee at bedside, resp even and unlabored

## 2016-11-01 NOTE — ED Notes (Signed)
Pacemaker interrogated using [employer]

## 2016-11-01 NOTE — ED Notes (Signed)
EDP at bedside  

## 2016-11-06 ENCOUNTER — Encounter: Payer: Self-pay | Admitting: Family Medicine

## 2016-11-06 ENCOUNTER — Ambulatory Visit (INDEPENDENT_AMBULATORY_CARE_PROVIDER_SITE_OTHER): Payer: PPO | Admitting: Family Medicine

## 2016-11-06 VITALS — BP 138/88 | HR 66 | Temp 98.0°F | Resp 16 | Ht 71.0 in | Wt 250.0 lb

## 2016-11-06 DIAGNOSIS — I252 Old myocardial infarction: Secondary | ICD-10-CM | POA: Diagnosis not present

## 2016-11-06 DIAGNOSIS — I10 Essential (primary) hypertension: Secondary | ICD-10-CM

## 2016-11-06 DIAGNOSIS — J3089 Other allergic rhinitis: Secondary | ICD-10-CM

## 2016-11-06 DIAGNOSIS — R7303 Prediabetes: Secondary | ICD-10-CM | POA: Diagnosis not present

## 2016-11-06 DIAGNOSIS — I5020 Unspecified systolic (congestive) heart failure: Secondary | ICD-10-CM

## 2016-11-06 DIAGNOSIS — I255 Ischemic cardiomyopathy: Secondary | ICD-10-CM | POA: Diagnosis not present

## 2016-11-06 DIAGNOSIS — E782 Mixed hyperlipidemia: Secondary | ICD-10-CM | POA: Diagnosis not present

## 2016-11-06 NOTE — Patient Instructions (Signed)
Thank you for coming to the clinic today.  1. Keep up the good work - No medications changes today - Keep checking BP regularly, write down readings, if >140/90 consistently let me or Dr Ubaldo Glassing know - Work on improving regular exercise, and weight management as discussed, reduce portion size  2. For cholesterol, since can't tolerate Rosuvastatin may try cutting 5mg  tab in half and take daily OR try whole tablet M - W - F - Discuss this further with Dr Ubaldo Glassing at next visit, may ask about Repatha or PSK9 medication for cholesterol  3. Continue current allergy regimen, recommend to switch Benadryl to Allegra 180mg  once daily as this should be less drowsy and less side effects of dizziness, continue Flonase 2 sprays daily for now.  Please schedule a follow-up appointment with Dr. Parks Ranger in 3 months HTN, Cholesterol  If you have any other questions or concerns, please feel free to call the clinic or send a message through [place]. You may also schedule an earlier appointment if necessary.  Nobie Putnam, DO [place]

## 2016-11-06 NOTE — Assessment & Plan Note (Signed)
Stable without recurrence Known CAD s/p prior NSTEMI with complication ischemic cardiomyopathy Followed by Cardiology

## 2016-11-06 NOTE — Assessment & Plan Note (Addendum)
Elevated BP initially SBP >160, home readings had previously been controlled, now with recent allergies and flare had elevated BP, less active since difficulty going outside due to allergies. Did not take BP meds today. Improved manual re-check Complication with known CAD s/p MI Failed: Carvedilol (intolerance)  Plan: 1. Continue current med regimen - Lisinopril 30mg  daily, Bystolic (Nebivolol) 5mg  daily, Spironolactone 25mg  daily 2. Encourage improve regular exercise, continue low sodium diet, reduce portion size for wt loss 3. Monitor BP at home, if elevated >140/90 persistently elevated 4. Continue follow-up with Cardiology 5. Follow-up 3 months HTN - not due for routine labs until 08/2017 for annual phys

## 2016-11-06 NOTE — Assessment & Plan Note (Signed)
Significant allergy symptoms Spring/Summer worse. Recent ED visit, though related to anti-histamines and allergies, now improved Advised stop Benadryl, switch to 2nd generation antihistamine such as Allegra 180mg  daily, continue Flonase

## 2016-11-06 NOTE — Assessment & Plan Note (Signed)
Followed by Dr Ubaldo Glassing [facility] Cardiology) Secondary to history of NSTEMI, s/p PCI Last ECHO EF 30-45% On current med management Today asymptomatic and euvolemic. Rarely takes lasix No changes, follow-up with Cardiology

## 2016-11-06 NOTE — Progress Notes (Signed)
Subjective:    Patient ID: Jose Dunlap, male    DOB: 1951-04-13, 66 y.o.   MRN: [id number]  Jose Dunlap is a 66 y.o. male presenting on 11/06/2016 for Hypertension   HPI   CHRONIC HTN: Reports checks BP regularly at home 140s/70s with HR 60-70, and this AM SBP 150s. Current Meds - Lisinopril 30mg , Bystolic (Nebivolol) 5mg  daily (new start about 3 months ago, changed from Carvedilol, now improved), Spironolatone 25mg  daily Reports good compliance, did not meds today yet (no missed doses regularly). Tolerating well, w/o complaints. Lifestyle - Decreased activity and regular exercise due to outdoor allergies. Has had some wt gain, trying to reduce portion, eats low salt diet, caffeine 1-2 cups coffee daily Denies CP, dyspnea, HA, edema, dizziness / lightheadedness  CAD s/p NSTEMI wit Ischemic Cardiomyopathy (Reduced EF) / S/p Pacemaker/AICD / HYPERLIPIDEMIA: Followed by Dr Ubaldo Glassing [facility] Cardiology, last visit 09/2016, reviews history in past has tried 3 cholesterol medications and failed due to intolerance, failed rosuvastatin 5mg  daily (dizziness), failed Zetia (dizziness), prior failed Simvastatin 20mg  (myalgias) - Taking ASA 81mg  daily, Plavix 75mg  - S/p MI, pacemaker, ICD, Rare use of lasix 20mg  PRN swelling only - He follows with Cardiology every 6 months and for pacer check every 3 months - Denies any chest pain or pressure, dyspnea, or exertional symptoms  ED FOLLOW-UP DIZZINESS / Environmental and Seasonal Allergies: - Reports worse season is Spring (pine pollen), tried OTC sudafed, benadryl, claritin, also using flonase (2 sprays each side for past 1-2 weeks) regularly, has had chronic allergies, this year has been worse. Recently on 4/26 he went to ED due to gradual worsening dizziness and lightheadedness, had some blurry vision, felt "nearly pass out", felt BP was elevated, at BP of 187/94 in ED, labs normal, overall unclear etiology it gradually resolved. - Plans to try allegra,  had been taking benadryl since finished claritin - Prior history of BPPV vertigo in past in 1990s - Admits some sinus and postnasal congestion, but now improving - Denies sinus pain or pressure, ear pain or pressure, fevers/chills  Pre-Diabetes Prior history of A1c 6 to 5.7 now last reading 5.5 in 08/2016. Has done well on modified diet. No new concerns today. No prior treatment.  Social History  Substance Use Topics  . Smoking status: Former Research scientist (life sciences)  . Smokeless tobacco: Never Used  . Alcohol use Yes    Review of Systems Per HPI unless specifically indicated above     Objective:    BP 138/88 (BP Location: Left Arm, Cuff Size: Normal)   Pulse 66   Temp 98 F (36.7 C) (Oral)   Resp 16   Ht 5\' 11"  (1.803 m)   Wt 250 lb (113.4 kg)   BMI 34.87 kg/m   Wt Readings from Last 3 Encounters:  11/06/16 250 lb (113.4 kg)  11/01/16 250 lb (113.4 kg)  08/07/16 251 lb (113.9 kg)    Physical Exam  Constitutional: He is oriented to person, place, and time. He appears well-developed and well-nourished. No distress.  Well-appearing, comfortable, cooperative  HENT:  Head: Normocephalic.  Mouth/Throat: Oropharynx is clear and moist.  Frontal / maxillary sinuses non-tender. Nares mostly patent with some deeper turbinate edema with mild congestion without purulence. Oropharynx clear without erythema, exudates, edema or asymmetry.  Eyes: Conjunctivae are normal. Right eye exhibits no discharge. Left eye exhibits no discharge.  Neck: Normal range of motion. Neck supple.  Cardiovascular: Normal rate, regular rhythm and intact distal pulses.   Murmur (  Faint 1-2/6 left sternal border) heard. Pulmonary/Chest: Effort normal and breath sounds normal. No respiratory distress. He has no wheezes. He has no rales.  Speaks full sentences  Musculoskeletal: He exhibits no edema.  Neurological: He is alert and oriented to person, place, and time.  Skin: Skin is warm and dry. No rash noted. He is not  diaphoretic. No erythema.  Psychiatric: He has a normal mood and affect. His behavior is normal.  Nursing note and vitals reviewed.  I have personally reviewed the following lab results from 11/01/16.  Results for orders placed or performed during the hospital encounter of 49/67/59  Basic metabolic panel  Result Value Ref Range   Sodium 136 135 - 145 mmol/L   Potassium 4.4 3.5 - 5.1 mmol/L   Chloride 99 (L) 101 - 111 mmol/L   CO2 27 22 - 32 mmol/L   Glucose, Bld 109 (H) 65 - 99 mg/dL   BUN 17 6 - 20 mg/dL   Creatinine, Ser 0.85 0.61 - 1.24 mg/dL   Calcium 9.4 8.9 - 10.3 mg/dL   GFR calc non Af Amer >60 >60 mL/min   GFR calc Af Amer >60 >60 mL/min   Anion gap 10 5 - 15  CBC  Result Value Ref Range   WBC 8.7 3.8 - 10.6 K/uL   RBC 5.45 4.40 - 5.90 MIL/uL   Hemoglobin 16.3 13.0 - 18.0 g/dL   HCT 47.6 40.0 - 52.0 %   MCV 87.2 80.0 - 100.0 fL   MCH 29.8 26.0 - 34.0 pg   MCHC 34.2 32.0 - 36.0 g/dL   RDW 14.3 11.5 - 14.5 %   Platelets 188 150 - 440 K/uL  Urinalysis, Complete w Microscopic  Result Value Ref Range   Color, Urine YELLOW (A) YELLOW   APPearance CLEAR (A) CLEAR   Specific Gravity, Urine 1.005 1.005 - 1.030   pH 7.0 5.0 - 8.0   Glucose, UA NEGATIVE NEGATIVE mg/dL   Hgb urine dipstick NEGATIVE NEGATIVE   Bilirubin Urine NEGATIVE NEGATIVE   Ketones, ur NEGATIVE NEGATIVE mg/dL   Protein, ur NEGATIVE NEGATIVE mg/dL   Nitrite NEGATIVE NEGATIVE   Leukocytes, UA NEGATIVE NEGATIVE   RBC / HPF 0-5 0 - 5 RBC/hpf   WBC, UA 0-5 0 - 5 WBC/hpf   Bacteria, UA NONE SEEN NONE SEEN   Squamous Epithelial / LPF NONE SEEN NONE SEEN  Hepatic function panel  Result Value Ref Range   Total Protein 7.2 6.5 - 8.1 g/dL   Albumin 4.2 3.5 - 5.0 g/dL   AST 24 15 - 41 U/L   ALT 32 17 - 63 U/L   Alkaline Phosphatase 61 38 - 126 U/L   Total Bilirubin 1.1 0.3 - 1.2 mg/dL   Bilirubin, Direct <0.1 (L) 0.1 - 0.5 mg/dL   Indirect Bilirubin NOT CALCULATED 0.3 - 0.9 mg/dL      Assessment &  Plan:   Problem List Items Addressed This Visit    Pre-diabetes    Stable, improved last A1c 5.5 in 08/2016, lifestyle controlled. No medications No complications Plan to check A1c q 6-12 months, if wt gain can repeat in Fall 2018, otherwise can check yearly 08/2017      Hyperlipidemia    Mixed hyperlipidemia on last lipid 08/2016 Has failed several cholesterol agents (Zetia and Rosuvastatin 5, dizziness/intolerance), Simvastatin (myalgias) LDL mildly >100, not at goal  Plan: 1. Discussed options to adjust his current Rosvuastatin, he has not followed back up with Cardiology yet, I  advised him to try either half tab for 2.5mg  DAILY, or can try whole tab 5mg  3 times weekly (M-W-F) to see how he tolerates those doses and review again with Cardiology at next visit or notify them earlier. Also reviewed option for possible Repatha / PSK9 inhibitor, should meet criteria with prior cardiac event MI and failure of several meds, defer rx for now 2. Next lipids 08/2017 for annual physical unless cardiology needs sooner      Relevant Medications   rosuvastatin (CRESTOR) 5 MG tablet   History of non-ST elevation myocardial infarction (NSTEMI)    Stable without recurrence Known CAD s/p prior NSTEMI with complication ischemic cardiomyopathy Followed by Cardiology      Essential (primary) hypertension - Primary    Elevated BP initially SBP >160, home readings had previously been controlled, now with recent allergies and flare had elevated BP, less active since difficulty going outside due to allergies. Did not take BP meds today. Improved manual re-check Complication with known CAD s/p MI Failed: Carvedilol (intolerance)  Plan: 1. Continue current med regimen - Lisinopril 30mg  daily, Bystolic (Nebivolol) 5mg  daily, Spironolactone 25mg  daily 2. Encourage improve regular exercise, continue low sodium diet, reduce portion size for wt loss 3. Monitor BP at home, if elevated >140/90 persistently  elevated 4. Continue follow-up with Cardiology 5. Follow-up 3 months HTN - not due for routine labs until 08/2017 for annual phys      Relevant Medications   rosuvastatin (CRESTOR) 5 MG tablet   Environmental and seasonal allergies    Significant allergy symptoms Spring/Summer worse. 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 Rarely takes lasix No changes, follow-up with Cardiology      Relevant Medications   rosuvastatin (CRESTOR) 5 MG tablet       Current Outpatient Prescriptions:  .  aspirin 81 MG chewable tablet, Chew 81 mg by mouth daily., Disp: , Rfl:  .  Cholecalciferol (D 1000) 1000 units capsule, Take 1 capsule by mouth daily., Disp: , Rfl:  .  clopidogrel (PLAVIX) 75 MG tablet, Take 1 tablet (75 mg total) by mouth daily., Disp: 90 tablet, Rfl: 3 .  furosemide (LASIX) 20 MG tablet, Take 1 tablet (20 mg total) by mouth daily. (Patient taking differently: Take 20 mg by mouth daily. Used rarely, as needed.), Disp: 90 tablet, Rfl: 1 .  lisinopril (PRINIVIL,ZESTRIL) 30 MG tablet, Take 1 tablet (30 mg total) by mouth daily., Disp: 90 tablet, Rfl: 3 .  Magnesium Cl-Calcium Carbonate (SLOW MAGNESIUM/CALCIUM) 70-117 MG TBEC, Take by mouth., Disp: , Rfl:  .  Multiple Vitamin (MULTIVITAMIN) capsule, Take 1 capsule by mouth daily., Disp: , Rfl:  .  nebivolol (BYSTOLIC) 5 MG tablet, Take 1 tablet (5 mg total) by mouth daily., Disp: 90 tablet, Rfl: 3 .  nitroGLYCERIN (NITROSTAT) 0.4 MG SL tablet, Place 1 tablet (0.4 mg total) under the tongue as needed., Disp: 25 tablet, Rfl: 4 .  Omega-3 Fatty Acids  (FISH OIL PO), Take 1 capsule by mouth daily., Disp: , Rfl:  .  spironolactone (ALDACTONE) 25 MG tablet, Take 1 tablet (25 mg total) by mouth daily., Disp: 90 tablet, Rfl:  3 .  vitamin B-12 (CYANOCOBALAMIN) 250 MCG tablet, Take 1 tablet by mouth daily., Disp: , Rfl:  .  rosuvastatin (CRESTOR) 5 MG tablet, Take by mouth., Disp: , Rfl:   I have reviewed the discharge medication list, and have reconciled the current and ED discharge medications today.  Follow up plan: Return in about 3 months (around 02/06/2017) for blood pressure.  Nobie Putnam, [facility] 11/06/2016, 9:53 AM

## 2016-11-06 NOTE — Assessment & Plan Note (Addendum)
Mixed hyperlipidemia on last lipid 08/2016 Has failed several cholesterol agents (Zetia and Rosuvastatin 5, dizziness/intolerance), Simvastatin (myalgias) LDL mildly >100, not at goal  Plan: 1. Discussed options to adjust his current Rosvuastatin, he has not followed back up with Cardiology yet, I advised him to try either half tab for 2.5mg  DAILY, or can try whole tab 5mg  3 times weekly (M-W-F) to see how he tolerates those doses and review again with Cardiology at next visit or notify them earlier. Also reviewed option for possible Repatha / PSK9 inhibitor, should meet criteria with prior cardiac event MI and failure of several meds, defer rx for now 2. Next lipids 08/2017 for annual physical unless cardiology needs sooner

## 2016-11-06 NOTE — Assessment & Plan Note (Signed)
Stable, improved last A1c 5.5 in 08/2016, lifestyle controlled. No medications No complications Plan to check A1c q 6-12 months, if wt gain can repeat in Fall 2018, otherwise can check yearly 08/2017

## 2016-12-27 DIAGNOSIS — I517 Cardiomegaly: Secondary | ICD-10-CM | POA: Diagnosis not present

## 2017-02-05 ENCOUNTER — Ambulatory Visit: Payer: PPO

## 2017-02-06 ENCOUNTER — Ambulatory Visit (INDEPENDENT_AMBULATORY_CARE_PROVIDER_SITE_OTHER): Payer: PPO | Admitting: Family Medicine

## 2017-02-06 ENCOUNTER — Encounter: Payer: Self-pay | Admitting: Family Medicine

## 2017-02-06 ENCOUNTER — Other Ambulatory Visit: Payer: Self-pay | Admitting: Family Medicine

## 2017-02-06 VITALS — BP 143/75 | HR 73 | Temp 98.6°F | Resp 16 | Ht 71.0 in | Wt 252.0 lb

## 2017-02-06 DIAGNOSIS — N401 Enlarged prostate with lower urinary tract symptoms: Secondary | ICD-10-CM

## 2017-02-06 DIAGNOSIS — I252 Old myocardial infarction: Secondary | ICD-10-CM | POA: Diagnosis not present

## 2017-02-06 DIAGNOSIS — Z125 Encounter for screening for malignant neoplasm of prostate: Secondary | ICD-10-CM

## 2017-02-06 DIAGNOSIS — I517 Cardiomegaly: Secondary | ICD-10-CM | POA: Insufficient documentation

## 2017-02-06 DIAGNOSIS — Z95820 Peripheral vascular angioplasty status with implants and grafts: Secondary | ICD-10-CM | POA: Insufficient documentation

## 2017-02-06 DIAGNOSIS — Z1159 Encounter for screening for other viral diseases: Secondary | ICD-10-CM

## 2017-02-06 DIAGNOSIS — Z959 Presence of cardiac and vascular implant and graft, unspecified: Secondary | ICD-10-CM | POA: Diagnosis not present

## 2017-02-06 DIAGNOSIS — I10 Essential (primary) hypertension: Secondary | ICD-10-CM

## 2017-02-06 DIAGNOSIS — E782 Mixed hyperlipidemia: Secondary | ICD-10-CM | POA: Diagnosis not present

## 2017-02-06 DIAGNOSIS — I2510 Atherosclerotic heart disease of native coronary artery without angina pectoris: Secondary | ICD-10-CM

## 2017-02-06 DIAGNOSIS — N138 Other obstructive and reflux uropathy: Secondary | ICD-10-CM

## 2017-02-06 DIAGNOSIS — R7303 Prediabetes: Secondary | ICD-10-CM

## 2017-02-06 MED ORDER — LISINOPRIL 30 MG PO TABS: 30.0000 mg | ORAL_TABLET | Freq: Every day | ORAL | 3 refills | Status: DC

## 2017-02-06 MED ORDER — EVOLOCUMAB 140 MG/ML ~~LOC~~ SOAJ: 140.0000 mg | SUBCUTANEOUS | 11 refills | Status: DC

## 2017-02-06 NOTE — Assessment & Plan Note (Signed)
Improved BP, mildly elevated. Declines manual re-check Complication with known CAD s/p MI Failed: Carvedilol (intolerance)  Plan: 1. Continue current med regimen -now night time dosing regimen - Lisinopril 30mg  daily, Bystolic (Nebivolol) 5mg  daily, Spironolactone 25mg  daily 2. Encourage improve regular exercise, continue low sodium diet, reduce portion size for wt loss 3. Monitor BP at home, if elevated >140/90 persistently elevated, notify office sooner 4. Continue follow-up with Cardiology 5. Follow-up 6 months HTN - labs + 08/2017 for annual phys

## 2017-02-06 NOTE — Assessment & Plan Note (Signed)
Stable hyperlipidemia on last lipid 08/2016, LDL >100 not at goal for CAD s/p MI stent Failed: Rosuvastatin 2.5, 5mg , Simvastatin 20mg , Zetia 10mg   Plan: 1. Discussed options again to lower lipid goal < 100 and reduce ASCVD risk, already on DAPT, and now self discontinued Rosuvastatin due to statin myalgia intolerance. Offered Repatha (evolocumab) PSK9 inhibitor, discussed benefits, potential side effects, types of injection either subq q 2 week vs monthly autoinjector patch - advised will likely need prior authorization. Patient agrees to start med, prefers to try injectable surelick pen 140mg  q 2 weeks (every 14 days) for now, sent 30 day supply with refills, will anticipate proceeding with PA due to known CAD s/p stent, history of MI, multiple statin and cholesterol med failures, LDL not at goal >100 - he may discuss this further with Cardiology 2. Follow-up 6 months for fasting labs re-check lipids, yearly physical

## 2017-02-06 NOTE — Patient Instructions (Addendum)
Thank you for coming to the clinic today.  1. Discontinue Rosuvastatin 2. Start Repatha injection - we will complete paperwork first and then you should be contacted, will submit to [facility] in [place] 3. Follow-up with Dr Ubaldo Glassing as planned and discuss this medicine with him as well, can re-check cholesterol 3-6 months after starting medicine 4. Keep up the good work with BP and lifestyle  DUE for FASTING BLOOD WORK (no food or drink after midnight before the lab appointment, only water or coffee without cream/sugar on the morning of)  SCHEDULE "Lab Only" visit in the morning at the clinic for lab draw in 6 MONTHS   - Make sure Lab Only appointment is at about 1 week before your next appointment, so that results will be available  For Lab Results, once available within 2-3 days of blood draw, you can can log in to MyChart online to view your results and a brief explanation. Also, we can discuss results at next follow-up visit.   Please schedule a Follow-up Appointment to: Return in about 6 months (around 08/09/2017) for Medicare Physical CPE / HTN/HLD.  If you have any other questions or concerns, please feel free to call the clinic or send a message through [facility]. You may also schedule an earlier appointment if necessary.  Additionally, you may be receiving a survey about your experience at our clinic within a few days to 1 week by e-mail or mail. We value your feedback.  Nobie Putnam, DO [place]

## 2017-02-06 NOTE — Assessment & Plan Note (Addendum)
Stable Known CAD s/p prior NSTEMI with stent placement with complication ischemic cardiomyopathy On BB, ASA, Plavix (DAPT), ACEi Failed Statin therapy - discussed and plan to start PSK9 inhib Repatha, see HLD Followed by Cardiology

## 2017-02-06 NOTE — Progress Notes (Signed)
Subjective:    Patient ID: Jose Dunlap, male    DOB: 1950/08/11, 66 y.o.   MRN: [id number]  Jose Dunlap is a 66 y.o. male presenting on 02/06/2017 for Hypertension   HPI   CHRONIC HTN: Reports checks BP regularly at home 120s-140s / 70-80s. Since last visit he changed all BP meds to night-time Current Meds - Lisinopril 30mg  (night), Bystolic (Nebivolol) 5mg  daily (night), Spironolatone 25mg  (night) Reports good compliance, did not meds today yet (no missed doses regularly). Tolerating well, w/o complaints. Lifestyle - Diet: balanced diet, limit salt intake (checks food labels), drinks plenty of water daily, coffee 1 cup daily (down from 2) - Exercise: Improved regular exercise and outdoor activity, now improved on allergies Denies CP, dyspnea, HA, edema, dizziness / lightheadedness  HYPERLIPDEMIA / CAD s/p NSTEMI wit Ischemic Cardiomyopathy (Reduced EF) / S/p Pacemaker/AICD: - Followed by Dr Ubaldo Glassing [facility] Cardiology q 6 months (last 09/2016 next 03/2017) and he follows for pacemaker test q 3 months - Last visit with me 11/2016, he was encouraged to adjust his previous statin, Rosuvastatin to even half tablet to 2.5mg  or try intermittent dosing to see if less myalgias. - Today he reports that he was still unable to tolerate even lowest dose Rosuvastatin, had symptoms of myalgias, and self discontinued this one. We had discussed Repatha at last visit, and he is interested in this option - Prior failed cholesterol treatment: Simvastatin 20mg , Rosuvastatin 5, 2.5mg , Zetia 10mg  - Significant history of CAD s/p MI with stent placement RCA, has ICD, taking ASA 81 and Plavix 75mg  daily for DAPT - Denies any chest pain or pressure, dyspnea, or exertional symptoms   Social History  Substance Use Topics  . Smoking status: Former Research scientist (life sciences)  . Smokeless tobacco: Never Used  . Alcohol use Yes    Review of Systems Per HPI unless specifically indicated above     Objective:    BP (!) 143/75    Pulse 73   Temp 98.6 F (37 C) (Oral)   Resp 16   Ht 5\' 11"  (1.803 m)   Wt 252 lb (114.3 kg)   BMI 35.15 kg/m   Wt Readings from Last 3 Encounters:  02/06/17 252 lb (114.3 kg)  11/06/16 250 lb (113.4 kg)  11/01/16 250 lb (113.4 kg)    Physical Exam  Constitutional: He is oriented to person, place, and time. He appears well-developed and well-nourished. No distress.  Well-appearing, comfortable, cooperative  HENT:  Head: Normocephalic and atraumatic.  Mouth/Throat: Oropharynx is clear and moist.  Eyes: Conjunctivae are normal. Right eye exhibits no discharge. Left eye exhibits no discharge.  Neck: Normal range of motion. Neck supple. No thyromegaly present.  Cardiovascular: Normal rate, regular rhythm and intact distal pulses.   Murmur (1/6 systolic faint) heard. Pulmonary/Chest: Effort normal and breath sounds normal. No respiratory distress. He has no wheezes. He has no rales.  Musculoskeletal: Normal range of motion. He exhibits no edema.  Lymphadenopathy:    He has no cervical adenopathy.  Neurological: He is alert and oriented to person, place, and time.  Skin: Skin is warm and dry. No rash noted. He is not diaphoretic. No erythema.  Psychiatric: He has a normal mood and affect. His behavior is normal.  Well groomed, good eye contact, normal speech and thoughts  Nursing note and vitals reviewed.  Results for orders placed or performed during the hospital encounter of 27/25/36  Basic metabolic panel  Result Value Ref Range   Sodium 136 135 -  145 mmol/L   Potassium 4.4 3.5 - 5.1 mmol/L   Chloride 99 (L) 101 - 111 mmol/L   CO2 27 22 - 32 mmol/L   Glucose, Bld 109 (H) 65 - 99 mg/dL   BUN 17 6 - 20 mg/dL   Creatinine, Ser 0.85 0.61 - 1.24 mg/dL   Calcium 9.4 8.9 - 10.3 mg/dL   GFR calc non Af Amer >60 >60 mL/min   GFR calc Af Amer >60 >60 mL/min   Anion gap 10 5 - 15  CBC  Result Value Ref Range   WBC 8.7 3.8 - 10.6 K/uL   RBC 5.45 4.40 - 5.90 MIL/uL   Hemoglobin  16.3 13.0 - 18.0 g/dL   HCT 47.6 40.0 - 52.0 %   MCV 87.2 80.0 - 100.0 fL   MCH 29.8 26.0 - 34.0 pg   MCHC 34.2 32.0 - 36.0 g/dL   RDW 14.3 11.5 - 14.5 %   Platelets 188 150 - 440 K/uL  Urinalysis, Complete w Microscopic  Result Value Ref Range   Color, Urine YELLOW (A) YELLOW   APPearance CLEAR (A) CLEAR   Specific Gravity, Urine 1.005 1.005 - 1.030   pH 7.0 5.0 - 8.0   Glucose, UA NEGATIVE NEGATIVE mg/dL   Hgb urine dipstick NEGATIVE NEGATIVE   Bilirubin Urine NEGATIVE NEGATIVE   Ketones, ur NEGATIVE NEGATIVE mg/dL   Protein, ur NEGATIVE NEGATIVE mg/dL   Nitrite NEGATIVE NEGATIVE   Leukocytes, UA NEGATIVE NEGATIVE   RBC / HPF 0-5 0 - 5 RBC/hpf   WBC, UA 0-5 0 - 5 WBC/hpf   Bacteria, UA NONE SEEN NONE SEEN   Squamous Epithelial / LPF NONE SEEN NONE SEEN  Hepatic function panel  Result Value Ref Range   Total Protein 7.2 6.5 - 8.1 g/dL   Albumin 4.2 3.5 - 5.0 g/dL   AST 24 15 - 41 U/L   ALT 32 17 - 63 U/L   Alkaline Phosphatase 61 38 - 126 U/L   Total Bilirubin 1.1 0.3 - 1.2 mg/dL   Bilirubin, Direct <0.1 (L) 0.1 - 0.5 mg/dL   Indirect Bilirubin NOT CALCULATED 0.3 - 0.9 mg/dL      Assessment & Plan:   Problem List Items Addressed This Visit    S/P angioplasty with stent   Relevant Medications   Evolocumab (REPATHA SURECLICK) 045 MG/ML SOAJ   Hyperlipidemia    Stable hyperlipidemia on last lipid 08/2016, LDL >100 not at goal for CAD s/p MI stent Failed: Rosuvastatin 2.5, 5mg , Simvastatin 20mg , Zetia 10mg   Plan: 1. Discussed options again to lower lipid goal < 100 and reduce ASCVD risk, already on DAPT, and now self discontinued Rosuvastatin due to statin myalgia intolerance. Offered Repatha (evolocumab) PSK9 inhibitor, discussed benefits, potential side effects, types of injection either subq q 2 week vs monthly autoinjector patch - advised will likely need prior authorization. Patient agrees to start med, prefers to try injectable surelick pen 140mg  q 2 weeks (every 14  days) for now, sent 30 day supply with refills, will anticipate proceeding with PA due to known CAD s/p stent, history of MI, multiple statin and cholesterol med failures, LDL not at goal >100 - he may discuss this further with Cardiology 2. Follow-up 6 months for fasting labs re-check lipids, yearly physical      Relevant Medications   lisinopril (PRINIVIL,ZESTRIL) 30 MG tablet   Evolocumab (REPATHA SURECLICK) 409 MG/ML SOAJ   History of non-ST elevation myocardial infarction (NSTEMI)    Stable without  recurrence Known CAD s/p prior NSTEMI s/p stent with complication ischemic cardiomyopathy Followed by Cardiology      Relevant Medications   Evolocumab (REPATHA SURECLICK) 761 MG/ML SOAJ   Essential hypertension - Primary    Improved BP, mildly elevated. Declines manual re-check Complication with known CAD s/p MI Failed: Carvedilol (intolerance)  Plan: 1. Continue current med regimen -now night time dosing regimen - Lisinopril 30mg  daily, Bystolic (Nebivolol) 5mg  daily, Spironolactone 25mg  daily 2. Encourage improve regular exercise, continue low sodium diet, reduce portion size for wt loss 3. Monitor BP at home, if elevated >140/90 persistently elevated, notify office sooner 4. Continue follow-up with Cardiology 5. Follow-up 6 months HTN - labs + 08/2017 for annual phys      Relevant Medications   lisinopril (PRINIVIL,ZESTRIL) 30 MG tablet   Evolocumab (REPATHA SURECLICK) 950 MG/ML SOAJ   CAD (coronary artery disease)    Stable Known CAD s/p prior NSTEMI with stent placement with complication ischemic cardiomyopathy On BB, ASA, Plavix (DAPT), ACEi Failed Statin therapy - discussed and plan to start PSK9 inhib Repatha, see HLD Followed by Cardiology      Relevant Medications   lisinopril (PRINIVIL,ZESTRIL) 30 MG tablet   Evolocumab (REPATHA SURECLICK) 932 MG/ML SOAJ      Meds ordered this encounter  Medications  . lisinopril (PRINIVIL,ZESTRIL) 30 MG tablet    Sig: Take 1  tablet (30 mg total) by mouth daily.    Dispense:  90 tablet    Refill:  3  . Evolocumab (REPATHA SURECLICK) 671 MG/ML SOAJ    Sig: Inject 140 mg into the skin every 14 (fourteen) days.    Dispense:  2 pen    Refill:  11    Follow up plan: Return in about 6 months (around 08/09/2017) for Medicare Physical CPE / HTN/HLD.   Recommend return for flu shot in fall 2018.  Nobie Putnam, [facility] 02/06/2017, 9:47 AM

## 2017-02-06 NOTE — Assessment & Plan Note (Signed)
Stable without recurrence Known CAD s/p prior NSTEMI s/p stent with complication ischemic cardiomyopathy Followed by Cardiology

## 2017-02-08 ENCOUNTER — Telehealth: Payer: Self-pay

## 2017-02-08 NOTE — Telephone Encounter (Signed)
Called to rescheduled AWV. Patient states he will call back at another time to schedule this

## 2017-04-16 DIAGNOSIS — Z95810 Presence of automatic (implantable) cardiac defibrillator: Secondary | ICD-10-CM | POA: Diagnosis not present

## 2017-04-16 DIAGNOSIS — E7801 Familial hypercholesterolemia: Secondary | ICD-10-CM | POA: Diagnosis not present

## 2017-04-16 DIAGNOSIS — I25118 Atherosclerotic heart disease of native coronary artery with other forms of angina pectoris: Secondary | ICD-10-CM | POA: Diagnosis not present

## 2017-04-16 DIAGNOSIS — I255 Ischemic cardiomyopathy: Secondary | ICD-10-CM | POA: Diagnosis not present

## 2017-04-16 DIAGNOSIS — I259 Chronic ischemic heart disease, unspecified: Secondary | ICD-10-CM | POA: Diagnosis not present

## 2017-04-16 DIAGNOSIS — I517 Cardiomegaly: Secondary | ICD-10-CM | POA: Diagnosis not present

## 2017-04-16 DIAGNOSIS — I10 Essential (primary) hypertension: Secondary | ICD-10-CM | POA: Diagnosis not present

## 2017-07-16 DIAGNOSIS — I517 Cardiomegaly: Secondary | ICD-10-CM | POA: Diagnosis not present

## 2017-07-30 ENCOUNTER — Ambulatory Visit: Payer: PPO

## 2017-08-05 ENCOUNTER — Other Ambulatory Visit: Payer: Self-pay

## 2017-08-05 DIAGNOSIS — N138 Other obstructive and reflux uropathy: Secondary | ICD-10-CM

## 2017-08-05 DIAGNOSIS — I10 Essential (primary) hypertension: Secondary | ICD-10-CM

## 2017-08-05 DIAGNOSIS — Z125 Encounter for screening for malignant neoplasm of prostate: Secondary | ICD-10-CM

## 2017-08-05 DIAGNOSIS — I2510 Atherosclerotic heart disease of native coronary artery without angina pectoris: Secondary | ICD-10-CM

## 2017-08-05 DIAGNOSIS — N401 Enlarged prostate with lower urinary tract symptoms: Secondary | ICD-10-CM

## 2017-08-05 DIAGNOSIS — E782 Mixed hyperlipidemia: Secondary | ICD-10-CM

## 2017-08-05 DIAGNOSIS — R7303 Prediabetes: Secondary | ICD-10-CM

## 2017-08-05 DIAGNOSIS — Z1159 Encounter for screening for other viral diseases: Secondary | ICD-10-CM

## 2017-08-05 NOTE — Addendum Note (Signed)
Addended by: Pearson Grippe on: 08/05/2017 11:36 AM   Modules accepted: Orders

## 2017-08-06 ENCOUNTER — Other Ambulatory Visit: Payer: PPO

## 2017-08-06 DIAGNOSIS — R7303 Prediabetes: Secondary | ICD-10-CM | POA: Diagnosis not present

## 2017-08-06 DIAGNOSIS — E782 Mixed hyperlipidemia: Secondary | ICD-10-CM | POA: Diagnosis not present

## 2017-08-06 DIAGNOSIS — I10 Essential (primary) hypertension: Secondary | ICD-10-CM | POA: Diagnosis not present

## 2017-08-06 DIAGNOSIS — I2510 Atherosclerotic heart disease of native coronary artery without angina pectoris: Secondary | ICD-10-CM | POA: Diagnosis not present

## 2017-08-07 LAB — CBC WITH DIFFERENTIAL/PLATELET
BASOS ABS: 33 {cells}/uL (ref 0–200)
Basophils Relative: 0.5 %
Eosinophils Absolute: 130 cells/uL (ref 15–500)
Eosinophils Relative: 2 %
HCT: 44.2 % (ref 38.5–50.0)
Hemoglobin: 15.2 g/dL (ref 13.2–17.1)
Lymphs Abs: 1268 cells/uL (ref 850–3900)
MCH: 29.7 pg (ref 27.0–33.0)
MCHC: 34.4 g/dL (ref 32.0–36.0)
MCV: 86.3 fL (ref 80.0–100.0)
MPV: 11.8 fL (ref 7.5–12.5)
Monocytes Relative: 6.3 %
NEUTROS PCT: 71.7 %
Neutro Abs: 4661 cells/uL (ref 1500–7800)
PLATELETS: 175 10*3/uL (ref 140–400)
RBC: 5.12 10*6/uL (ref 4.20–5.80)
RDW: 13.7 % (ref 11.0–15.0)
Total Lymphocyte: 19.5 %
WBC: 6.5 10*3/uL (ref 3.8–10.8)
WBCMIX: 410 {cells}/uL (ref 200–950)

## 2017-08-07 LAB — HEPATITIS C ANTIBODY
Hepatitis C Ab: NONREACTIVE
SIGNAL TO CUT-OFF: 0.01 (ref <1.00)

## 2017-08-07 LAB — COMPLETE METABOLIC PANEL WITH GFR
AG Ratio: 1.8 (calc) (ref 1.0–2.5)
ALT: 23 U/L (ref 9–46)
AST: 13 U/L (ref 10–35)
Albumin: 4.4 g/dL (ref 3.6–5.1)
Alkaline phosphatase (APISO): 66 U/L (ref 40–115)
BUN: 21 mg/dL (ref 7–25)
CALCIUM: 9.2 mg/dL (ref 8.6–10.3)
CO2: 27 mmol/L (ref 20–32)
Chloride: 100 mmol/L (ref 98–110)
Creat: 0.83 mg/dL (ref 0.70–1.25)
GFR, EST NON AFRICAN AMERICAN: 91 mL/min/{1.73_m2} (ref >=60)
GFR, Est African American: 106 mL/min/{1.73_m2} (ref >=60)
GLUCOSE: 112 mg/dL — AB (ref 65–99)
Globulin: 2.4 g/dL (calc) (ref 1.9–3.7)
Potassium: 4.1 mmol/L (ref 3.5–5.3)
Sodium: 136 mmol/L (ref 135–146)
Total Bilirubin: 1.3 mg/dL — ABNORMAL HIGH (ref 0.2–1.2)
Total Protein: 6.8 g/dL (ref 6.1–8.1)

## 2017-08-07 LAB — LIPID PANEL
Cholesterol: 218 mg/dL — ABNORMAL HIGH (ref <200)
HDL: 36 mg/dL — AB (ref >40)
Non-HDL Cholesterol (Calc): 182 mg/dL (calc) — ABNORMAL HIGH (ref <130)
TRIGLYCERIDES: 423 mg/dL — AB (ref <150)
Total CHOL/HDL Ratio: 6.1 (calc) — ABNORMAL HIGH (ref <5.0)

## 2017-08-07 LAB — HEMOGLOBIN A1C
EAG (MMOL/L): 6.6 (calc)
Hgb A1c MFr Bld: 5.8 % of total Hgb — ABNORMAL HIGH (ref <5.7)
Mean Plasma Glucose: 120 (calc)

## 2017-08-07 LAB — PSA, TOTAL WITH REFLEX TO PSA, FREE: PSA, TOTAL: 1.1 ng/mL (ref <=4.0)

## 2017-08-13 ENCOUNTER — Ambulatory Visit (INDEPENDENT_AMBULATORY_CARE_PROVIDER_SITE_OTHER): Payer: PPO | Admitting: Family Medicine

## 2017-08-13 ENCOUNTER — Encounter: Payer: Self-pay | Admitting: Family Medicine

## 2017-08-13 ENCOUNTER — Encounter: Payer: PPO | Admitting: Family Medicine

## 2017-08-13 VITALS — BP 142/86 | HR 64 | Temp 98.4°F | Resp 16 | Ht 71.0 in | Wt 255.0 lb

## 2017-08-13 DIAGNOSIS — E782 Mixed hyperlipidemia: Secondary | ICD-10-CM

## 2017-08-13 DIAGNOSIS — N138 Other obstructive and reflux uropathy: Secondary | ICD-10-CM

## 2017-08-13 DIAGNOSIS — D492 Neoplasm of unspecified behavior of bone, soft tissue, and skin: Secondary | ICD-10-CM

## 2017-08-13 DIAGNOSIS — N401 Enlarged prostate with lower urinary tract symptoms: Secondary | ICD-10-CM | POA: Diagnosis not present

## 2017-08-13 DIAGNOSIS — I5020 Unspecified systolic (congestive) heart failure: Secondary | ICD-10-CM

## 2017-08-13 DIAGNOSIS — R7303 Prediabetes: Secondary | ICD-10-CM | POA: Diagnosis not present

## 2017-08-13 DIAGNOSIS — I2510 Atherosclerotic heart disease of native coronary artery without angina pectoris: Secondary | ICD-10-CM

## 2017-08-13 DIAGNOSIS — Z0000 Encounter for general adult medical examination without abnormal findings: Secondary | ICD-10-CM | POA: Diagnosis not present

## 2017-08-13 DIAGNOSIS — I10 Essential (primary) hypertension: Secondary | ICD-10-CM | POA: Diagnosis not present

## 2017-08-13 DIAGNOSIS — L989 Disorder of the skin and subcutaneous tissue, unspecified: Secondary | ICD-10-CM

## 2017-08-13 DIAGNOSIS — I255 Ischemic cardiomyopathy: Secondary | ICD-10-CM

## 2017-08-13 MED ORDER — ICOSAPENT ETHYL 1 G PO CAPS: 2.0000 g | ORAL_CAPSULE | Freq: Two times a day (BID) | ORAL | 5 refills | Status: AC

## 2017-08-13 NOTE — Progress Notes (Signed)
Subjective:    Patient ID: Jose Dunlap, male    DOB: 1950-12-10, 67 y.o.   MRN: [id number]  Jose Dunlap is a 67 y.o. male presenting on 08/13/2017 for Annual Exam and Hyperlipidemia   HPI   Here for Annual Physical and Lab Review  HYPERLIPDEMIA / CAD s/p NSTEMI w/ Ischemic Cardiomyopathy (Reduced EF) / S/p Pacemaker/AICD: - Followed by Dr Ubaldo Glassing [facility] Cardiology q 6 months and for pacemaker test q 3 months - interval update with has still failed statin therapy due to myalgias unable to tolerate low dose Rosuvastatin, unable to afford Repatha PCSK9 despite some partial coverage - Today no new complaints, would consider other options for cholesterol, Cardiology advised on same issue as well. - Prior failed cholesterol treatment: Simvastatin 20mg , Rosuvastatin 5, 2.5mg , Zetia 10mg  - Significant history of CAD s/p MI with stent placement RCA, has ICD, taking ASA 81 and Plavix 75mg  daily for DAPT - Lifestyle now he has taken part-time job, to stay more active in retirement  Skin Lesions On back, has two spots one has scab does not seem to heal, other is brown spot, no symptoms, requesting dermatology  BPH s/p TURP Previously followed by Dr Jacqlyn Larsen [facility] Urology) now last seen 2017 has not returned. Doing well s/p surgery, voiding well without concern.   Health Maintenance: Will return for Flu Shot later this week.  Due routine screening Hep C - ordered for future  Depression screen [facility] 2/9 08/13/2017 02/06/2017 11/21/2015  Decreased Interest 0 0 0  Down, Depressed, Hopeless 0 0 0  PHQ - 2 Score 0 0 0    Past Medical History:  Diagnosis Date  . Essential hypertension   . Hyperlipidemia   . MI (myocardial infarction) ([place])   . Obesity   . Pacemaker   . Shortness of breath dyspnea    Past Surgical History:  Procedure Laterality Date  . ABDOMINAL HERNIA REPAIR  2001  . BACK SURGERY  1986  . COLONOSCOPY WITH PROPOFOL N/A 05/19/2015   Procedure: COLONOSCOPY WITH PROPOFOL;   Surgeon: Hulen Luster, MD;  Location: [facility] ENDOSCOPY;  Service: Gastroenterology;  Laterality: N/A;  . CORONARY ANGIOPLASTY    . INSERT / REPLACE / REMOVE PACEMAKER    . KNEE ARTHROSCOPY    . PACEMAKER PLACEMENT    . PROSTATE SURGERY    . TONSILLECTOMY     Social History   Socioeconomic History  . Marital status: Divorced    Spouse name: Not on file  . Number of children: Not on file  . Years of education: Not on file  . Highest education level: Not on file  Social Needs  . Financial resource strain: Not on file  . Food insecurity - worry: Not on file  . Food insecurity - inability: Not on file  . Transportation needs - medical: Not on file  . Transportation needs - non-medical: Not on file  Occupational History  . Not on file  Tobacco Use  . Smoking status: Former Research scientist (life sciences)  . Smokeless tobacco: Never Used  Substance and Sexual Activity  . Alcohol use: Yes  . Drug use: No  . Sexual activity: Not on file  Other Topics Concern  . Not on file  Social History Narrative  . Not on file   History reviewed. No pertinent family history. Current Outpatient Medications on File Prior to Visit  Medication Sig  . aspirin 81 MG chewable tablet Chew 81 mg by mouth daily.  . Cholecalciferol (D 1000) 1000  units capsule Take 1 capsule by mouth daily.  . clopidogrel (PLAVIX) 75 MG tablet Take 1 tablet (75 mg total) by mouth daily.  . furosemide (LASIX) 20 MG tablet Take 1 tablet (20 mg total) by mouth daily. (Patient taking differently: Take 20 mg by mouth as needed. Used rarely, as needed.)  . lisinopril (PRINIVIL,ZESTRIL) 30 MG tablet Take 1 tablet (30 mg total) by mouth daily.  . Magnesium Cl-Calcium Carbonate (SLOW MAGNESIUM/CALCIUM) 70-117 MG TBEC Take by mouth.  . Multiple Vitamin (MULTIVITAMIN) capsule Take 1 capsule by mouth daily.  . nebivolol (BYSTOLIC) 5 MG tablet Take 1 tablet (5 mg total) by mouth daily.  . nitroGLYCERIN (NITROSTAT) 0.4 MG SL tablet Place 1 tablet (0.4 mg total)  under the tongue as needed.  . Omega-3 Fatty Acids (FISH OIL PO) Take 1 capsule by mouth daily.  Marland Kitchen spironolactone (ALDACTONE) 25 MG tablet Take 1 tablet (25 mg total) by mouth daily.  . vitamin B-12 (CYANOCOBALAMIN) 250 MCG tablet Take 1 tablet by mouth daily.  . Evolocumab (REPATHA SURECLICK) 578 MG/ML SOAJ Inject 140 mg into the skin every 14 (fourteen) days. (Patient not taking: Reported on 08/13/2017)   No current facility-administered medications on file prior to visit.     Review of Systems  Constitutional: Negative for activity change, appetite change, chills, diaphoresis, fatigue and fever.  HENT: Negative for congestion, hearing loss and sinus pressure.   Eyes: Negative for visual disturbance.  Respiratory: Negative for apnea, cough, choking, chest tightness, shortness of breath and wheezing.   Cardiovascular: Negative for chest pain, palpitations and leg swelling.  Gastrointestinal: Negative for abdominal pain, anal bleeding, blood in stool, constipation, diarrhea, nausea and vomiting.  Endocrine: Negative for cold intolerance and polyuria.  Genitourinary: Negative for decreased urine volume, difficulty urinating, dysuria, frequency, hematuria, testicular pain and urgency.  Musculoskeletal: Negative for arthralgias, back pain and neck pain.  Skin: Negative for rash.  Allergic/Immunologic: Negative for environmental allergies.  Neurological: Negative for dizziness, weakness, light-headedness, numbness and headaches.  Hematological: Negative for adenopathy.  Psychiatric/Behavioral: Negative for behavioral problems, dysphoric mood and sleep disturbance. The patient is not nervous/anxious.    Per HPI unless specifically indicated above     Objective:    BP (!) 142/86 (BP Location: Right Arm, Patient Position: Sitting, Cuff Size: Normal)   Pulse 64   Temp 98.4 F (36.9 C) (Oral)   Resp 16   Ht 5\' 11"  (1.803 m)   Wt 255 lb (115.7 kg)   BMI 35.57 kg/m   Wt Readings from Last 3  Encounters:  08/13/17 255 lb (115.7 kg)  02/06/17 252 lb (114.3 kg)  11/06/16 250 lb (113.4 kg)    Physical Exam  Constitutional: He is oriented to person, place, and time. He appears well-developed and well-nourished. No distress.  Well-appearing, comfortable, cooperative  HENT:  Head: Normocephalic and atraumatic.  Mouth/Throat: Oropharynx is clear and moist.  Frontal / maxillary sinuses non-tender. Nares patent without purulence or edema. Bilateral TMs clear without erythema, effusion or bulging. Oropharynx clear without erythema, exudates, edema or asymmetry.  Eyes: Conjunctivae and EOM are normal. Pupils are equal, round, and reactive to light. Right eye exhibits no discharge. Left eye exhibits no discharge.  Neck: Normal range of motion. Neck supple. No thyromegaly present.  Cardiovascular: Normal rate, regular rhythm, normal heart sounds and intact distal pulses.  No murmur heard. Pulmonary/Chest: Effort normal and breath sounds normal. No respiratory distress. He has no wheezes. He has no rales.  Abdominal: Soft. Bowel sounds  are normal. He exhibits no distension and no mass. There is no tenderness.  Musculoskeletal: Normal range of motion. He exhibits no edema or tenderness.  Upper / Lower Extremities: - Normal muscle tone, strength bilateral upper extremities 5/5, lower extremities 5/5  Lymphadenopathy:    He has no cervical adenopathy.  Neurological: He is alert and oriented to person, place, and time.  Distal sensation intact to light touch all extremities  Skin: Skin is warm and dry. No rash noted. He is not diaphoretic. No erythema.  See skin lesions on back picture below  Psychiatric: He has a normal mood and affect. His behavior is normal.  Well groomed, good eye contact, normal speech and thoughts  Nursing note and vitals reviewed.  L mid back     Results for orders placed or performed in visit on 08/05/17  COMPLETE METABOLIC PANEL WITH GFR  Result Value Ref  Range   Glucose, Bld 112 (H) 65 - 99 mg/dL   BUN 21 7 - 25 mg/dL   Creat 0.83 0.70 - 1.25 mg/dL   GFR, Est Non African American 91 > OR = 60 mL/min/1.81m2   GFR, Est African American 106 > OR = 60 mL/min/1.78m2   BUN/Creatinine Ratio NOT APPLICABLE 6 - 22 (calc)   Sodium 136 135 - 146 mmol/L   Potassium 4.1 3.5 - 5.3 mmol/L   Chloride 100 98 - 110 mmol/L   CO2 27 20 - 32 mmol/L   Calcium 9.2 8.6 - 10.3 mg/dL   Total Protein 6.8 6.1 - 8.1 g/dL   Albumin 4.4 3.6 - 5.1 g/dL   Globulin 2.4 1.9 - 3.7 g/dL (calc)   AG Ratio 1.8 1.0 - 2.5 (calc)   Total Bilirubin 1.3 (H) 0.2 - 1.2 mg/dL   Alkaline phosphatase (APISO) 66 40 - 115 U/L   AST 13 10 - 35 U/L   ALT 23 9 - 46 U/L  Lipid panel  Result Value Ref Range   Cholesterol 218 (H) <200 mg/dL   HDL 36 (L) >40 mg/dL   Triglycerides 423 (H) <150 mg/dL   LDL Cholesterol (Calc)  mg/dL (calc)   Total CHOL/HDL Ratio 6.1 (H) <5.0 (calc)   Non-HDL Cholesterol (Calc) 182 (H) <130 mg/dL (calc)  CBC with Differential/Platelet  Result Value Ref Range   WBC 6.5 3.8 - 10.8 Thousand/uL   RBC 5.12 4.20 - 5.80 Million/uL   Hemoglobin 15.2 13.2 - 17.1 g/dL   HCT 44.2 38.5 - 50.0 %   MCV 86.3 80.0 - 100.0 fL   MCH 29.7 27.0 - 33.0 pg   MCHC 34.4 32.0 - 36.0 g/dL   RDW 13.7 11.0 - 15.0 %   Platelets 175 140 - 400 Thousand/uL   MPV 11.8 7.5 - 12.5 fL   Neutro Abs 4,661 1,500 - 7,800 cells/uL   Lymphs Abs 1,268 850 - 3,900 cells/uL   WBC mixed population 410 200 - 950 cells/uL   Eosinophils Absolute 130 15 - 500 cells/uL   Basophils Absolute 33 0 - 200 cells/uL   Neutrophils Relative % 71.7 %   Total Lymphocyte 19.5 %   Monocytes Relative 6.3 %   Eosinophils Relative 2.0 %   Basophils Relative 0.5 %  Hemoglobin A1c  Result Value Ref Range   Hgb A1c MFr Bld 5.8 (H) <5.7 % of total Hgb   Mean Plasma Glucose 120 (calc)   eAG (mmol/L) 6.6 (calc)  Hepatitis C antibody  Result Value Ref Range   Hepatitis C Ab NON-REACTIVE NON-REACTI  SIGNAL TO  CUT-OFF 0.01 <1.00  PSA, Total with Reflex to PSA, Free  Result Value Ref Range   PSA, Total 1.1 < OR = 4.0 ng/mL      Assessment & Plan:   Problem List Items Addressed This Visit    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 (HCC)    Followed by Dr Ubaldo Glassing [facility] Cardiology) Secondary to history of NSTEMI, s/p PCI Last ECHO EF 30-45% Continue on current med management      Relevant Medications   Icosapent Ethyl (VASCEPA) 1 g CAPS   BPH with obstruction/lower urinary tract symptoms    Stable controlled s/p TURP Prior [facility] Urology Dr Jacqlyn Larsen      CAD (coronary artery disease)    Stable Known CAD s/p prior NSTEMI with stent placement with complication ischemic cardiomyopathy On BB, ASA, Plavix (DAPT), ACEi Failed Statin therapy - discussed med options limited due to cost / intolerance, now try Vascepa rx, see HLD Followed by Cardiology      Relevant Medications   Icosapent Ethyl (VASCEPA) 1 g CAPS   Essential hypertension    Mildly elevated Complication with known CAD s/p MI Failed: Carvedilol (intolerance)  Plan: 1. Continue current med regimen Lisinopril 30mg  daily, Bystolic (Nebivolol) 5mg  daily, Spironolactone 25mg  daily 2. Encourage improve regular exercise, continue low sodium diet, reduce portion size for wt loss 3. Monitor BP at home, if elevated >140/90 persistently elevated, notify office sooner 4. Continue follow-up with Cardiology 5. Follow-up 6 months      Relevant Medications   Icosapent Ethyl (VASCEPA) 1 g CAPS   Hyperlipidemia    Dramatic elevated TG, unable to calc LDL Goal LDL < 100, not achieved goal for CAD s/p MI stent Failed: Rosuvastatin 2.5, 5mg , Simvastatin 20mg , Zetia 10mg   Plan: 1. Discussed options again to lower lipid goal < 100 and reduce ASCVD risk, already on DAPT - Unable to get PCSK9 due to cost, also cardiology unable to help with this rx - have submitted all [place] and rx coverage paperwork, but still copay  too high - NEW Rx Vascepa 1g caps x 2 BID - Consider last resort with once weekly low dose statin - Follow-up 6 months for fasting labs re-check lipids      Relevant Medications   Icosapent Ethyl (VASCEPA) 1 g CAPS   Pre-diabetes    Elevated A1c 5.8 from 5.5 Likely secondary to dietary non adherence Encourage improve lifestyle diet/exercise Follow-up A1c q 6 mo       Other Visit Diagnoses    Annual physical exam    -  Primary   Non-healing skin lesion     Concern pre-cancerous vs skin cancer, non healing lesion on back Other lesion consistent with SK benign Referral to Derm    Relevant Orders   Ambulatory referral to Dermatology   Skin neoplasm       Relevant Orders   Ambulatory referral to Dermatology      Meds ordered this encounter  Medications  . Icosapent Ethyl (VASCEPA) 1 g CAPS    Sig: Take 2 g by mouth 2 (two) times daily.    Dispense:  120 capsule    Refill:  5    Follow up plan: Return in about 6 months (around 02/10/2018) for HLD lab review, A1c.  Future labs ordered 6 months  Nobie Putnam, DO [facility] 08/14/2017, 1:25 AM

## 2017-08-13 NOTE — Patient Instructions (Addendum)
Thank you for coming to the office today.  1. Start Vascepa (advanced type of fish oil rx - 2 capsules twice daily - sent rx to [facility] - let me know cost/coverage if need to change anything  2. Keep working on lifestyle as discussed  3.  Recommend referral to Dermatologist  [facility] - Dr. Ree Edman   [address] [place], [place] [address] Phone: [phone]   DUE for FASTING BLOOD WORK (no food or drink after midnight before the lab appointment, only water or coffee without cream/sugar on the morning of)  SCHEDULE "Lab Only" visit in the morning at the clinic for lab draw in 6 MONTHS   - Make sure Lab Only appointment is at about 1 week before your next appointment, so that results will be available  For Lab Results, once available within 2-3 days of blood draw, you can can log in to MyChart online to view your results and a brief explanation. Also, we can discuss results at next follow-up visit.   Please schedule a Follow-up Appointment to: Return in about 6 months (around 02/10/2018) for HLD lab review, A1c.   If you have any other questions or concerns, please feel free to call the office or send a message through [place]. You may also schedule an earlier appointment if necessary.  Additionally, you may be receiving a survey about your experience at our office within a few days to 1 week by e-mail or mail. We value your feedback.  Nobie Putnam, DO [facility]

## 2017-08-14 ENCOUNTER — Other Ambulatory Visit: Payer: Self-pay | Admitting: Family Medicine

## 2017-08-14 DIAGNOSIS — R7303 Prediabetes: Secondary | ICD-10-CM

## 2017-08-14 DIAGNOSIS — Z1159 Encounter for screening for other viral diseases: Secondary | ICD-10-CM

## 2017-08-14 DIAGNOSIS — I10 Essential (primary) hypertension: Secondary | ICD-10-CM

## 2017-08-14 DIAGNOSIS — E782 Mixed hyperlipidemia: Secondary | ICD-10-CM

## 2017-08-14 NOTE — Assessment & Plan Note (Addendum)
Followed by Dr Fath ([facility]) Secondary to history of NSTEMI, s/p PCI Last ECHO EF 30-45% Continue on current med management 

## 2017-08-14 NOTE — Assessment & Plan Note (Signed)
Stable controlled s/p TURP Prior [facility] Urology Dr Jacqlyn Larsen

## 2017-08-14 NOTE — Assessment & Plan Note (Signed)
Mildly elevated Complication with known CAD s/p MI Failed: Carvedilol (intolerance)  Plan: 1. Continue current med regimen Lisinopril 30mg  daily, Bystolic (Nebivolol) 5mg  daily, Spironolactone 25mg  daily 2. Encourage improve regular exercise, continue low sodium diet, reduce portion size for wt loss 3. Monitor BP at home, if elevated >140/90 persistently elevated, notify office sooner 4. Continue follow-up with Cardiology 5. Follow-up 6 months

## 2017-08-14 NOTE — Assessment & Plan Note (Signed)
Stable Known CAD s/p prior NSTEMI with stent placement with complication ischemic cardiomyopathy On BB, ASA, Plavix (DAPT), ACEi Failed Statin therapy - discussed med options limited due to cost / intolerance, now try Vascepa rx, see HLD Followed by Cardiology

## 2017-08-14 NOTE — Assessment & Plan Note (Signed)
Dramatic elevated TG, unable to calc LDL Goal LDL < 100, not achieved goal for CAD s/p MI stent Failed: Rosuvastatin 2.5, 5mg , Simvastatin 20mg , Zetia 10mg   Plan: 1. Discussed options again to lower lipid goal < 100 and reduce ASCVD risk, already on DAPT - Unable to get PCSK9 due to cost, also cardiology unable to help with this rx - have submitted all PA and rx coverage paperwork, but still copay too high - NEW Rx Vascepa 1g caps x 2 BID - Consider last resort with once weekly low dose statin - Follow-up 6 months for fasting labs re-check lipids

## 2017-08-14 NOTE — Assessment & Plan Note (Signed)
Elevated A1c 5.8 from 5.5 Likely secondary to dietary non adherence Encourage improve lifestyle diet/exercise Follow-up A1c q 6 mo

## 2017-08-15 ENCOUNTER — Ambulatory Visit (INDEPENDENT_AMBULATORY_CARE_PROVIDER_SITE_OTHER): Payer: PPO

## 2017-08-15 DIAGNOSIS — Z23 Encounter for immunization: Secondary | ICD-10-CM | POA: Diagnosis not present

## 2017-08-26 DIAGNOSIS — C44519 Basal cell carcinoma of skin of other part of trunk: Secondary | ICD-10-CM | POA: Diagnosis not present

## 2017-08-26 DIAGNOSIS — D485 Neoplasm of uncertain behavior of skin: Secondary | ICD-10-CM | POA: Diagnosis not present

## 2017-09-10 ENCOUNTER — Other Ambulatory Visit: Payer: Self-pay

## 2017-09-10 DIAGNOSIS — I10 Essential (primary) hypertension: Secondary | ICD-10-CM

## 2017-09-10 DIAGNOSIS — I259 Chronic ischemic heart disease, unspecified: Secondary | ICD-10-CM

## 2017-09-10 MED ORDER — NEBIVOLOL HCL 5 MG PO TABS: 5.0000 mg | ORAL_TABLET | Freq: Every day | ORAL | 1 refills | Status: DC

## 2017-09-27 DIAGNOSIS — C44519 Basal cell carcinoma of skin of other part of trunk: Secondary | ICD-10-CM | POA: Diagnosis not present

## 2017-10-23 ENCOUNTER — Encounter: Payer: Self-pay | Admitting: Emergency Medicine

## 2017-10-23 ENCOUNTER — Observation Stay
Admission: EM | Admit: 2017-10-23 | Discharge: 2017-10-24 | Disposition: A | Discharge status: Home / Self Care | Payer: PPO | Attending: Internal Medicine | Admitting: Internal Medicine

## 2017-10-23 ENCOUNTER — Emergency Department: Payer: PPO

## 2017-10-23 ENCOUNTER — Other Ambulatory Visit: Payer: Self-pay

## 2017-10-23 DIAGNOSIS — I110 Hypertensive heart disease with heart failure: Secondary | ICD-10-CM | POA: Insufficient documentation

## 2017-10-23 DIAGNOSIS — Z6834 Body mass index (BMI) 34.0-34.9, adult: Secondary | ICD-10-CM | POA: Insufficient documentation

## 2017-10-23 DIAGNOSIS — I252 Old myocardial infarction: Secondary | ICD-10-CM | POA: Diagnosis not present

## 2017-10-23 DIAGNOSIS — I5022 Chronic systolic (congestive) heart failure: Secondary | ICD-10-CM | POA: Insufficient documentation

## 2017-10-23 DIAGNOSIS — Z955 Presence of coronary angioplasty implant and graft: Secondary | ICD-10-CM | POA: Insufficient documentation

## 2017-10-23 DIAGNOSIS — Z950 Presence of cardiac pacemaker: Secondary | ICD-10-CM | POA: Diagnosis not present

## 2017-10-23 DIAGNOSIS — R079 Chest pain, unspecified: Principal | ICD-10-CM | POA: Diagnosis present

## 2017-10-23 DIAGNOSIS — R42 Dizziness and giddiness: Secondary | ICD-10-CM | POA: Insufficient documentation

## 2017-10-23 DIAGNOSIS — Z7902 Long term (current) use of antithrombotics/antiplatelets: Secondary | ICD-10-CM | POA: Insufficient documentation

## 2017-10-23 DIAGNOSIS — I255 Ischemic cardiomyopathy: Secondary | ICD-10-CM | POA: Diagnosis not present

## 2017-10-23 DIAGNOSIS — I2510 Atherosclerotic heart disease of native coronary artery without angina pectoris: Secondary | ICD-10-CM | POA: Insufficient documentation

## 2017-10-23 DIAGNOSIS — E669 Obesity, unspecified: Secondary | ICD-10-CM | POA: Diagnosis not present

## 2017-10-23 DIAGNOSIS — I472 Ventricular tachycardia, unspecified: Secondary | ICD-10-CM

## 2017-10-23 DIAGNOSIS — Z87891 Personal history of nicotine dependence: Secondary | ICD-10-CM | POA: Insufficient documentation

## 2017-10-23 DIAGNOSIS — Z7982 Long term (current) use of aspirin: Secondary | ICD-10-CM | POA: Diagnosis not present

## 2017-10-23 DIAGNOSIS — R0602 Shortness of breath: Secondary | ICD-10-CM | POA: Diagnosis not present

## 2017-10-23 DIAGNOSIS — Z79899 Other long term (current) drug therapy: Secondary | ICD-10-CM | POA: Insufficient documentation

## 2017-10-23 DIAGNOSIS — R000 Tachycardia, unspecified: Secondary | ICD-10-CM | POA: Insufficient documentation

## 2017-10-23 DIAGNOSIS — I10 Essential (primary) hypertension: Secondary | ICD-10-CM | POA: Diagnosis not present

## 2017-10-23 LAB — BASIC METABOLIC PANEL
ANION GAP: 7 (ref 5–15)
BUN: 13 mg/dL (ref 6–20)
CHLORIDE: 102 mmol/L (ref 101–111)
CO2: 28 mmol/L (ref 22–32)
Calcium: 9.1 mg/dL (ref 8.9–10.3)
Creatinine, Ser: 0.91 mg/dL (ref 0.61–1.24)
GFR calc Af Amer: >60 mL/min (ref >60)
GLUCOSE: 110 mg/dL — AB (ref 65–99)
POTASSIUM: 4.5 mmol/L (ref 3.5–5.1)
Sodium: 137 mmol/L (ref 135–145)

## 2017-10-23 LAB — TROPONIN I
Troponin I: <0.03 ng/mL (ref <0.03)
Troponin I: <0.03 ng/mL (ref <0.03)

## 2017-10-23 LAB — CBC
HEMATOCRIT: 47 % (ref 40.0–52.0)
HEMOGLOBIN: 16.4 g/dL (ref 13.0–18.0)
MCH: 30.5 pg (ref 26.0–34.0)
MCHC: 34.8 g/dL (ref 32.0–36.0)
MCV: 87.7 fL (ref 80.0–100.0)
Platelets: 168 10*3/uL (ref 150–440)
RBC: 5.36 MIL/uL (ref 4.40–5.90)
RDW: 14.9 % — ABNORMAL HIGH (ref 11.5–14.5)
WBC: 7.7 10*3/uL (ref 3.8–10.6)

## 2017-10-23 LAB — FIBRIN DERIVATIVES D-DIMER (ARMC ONLY): FIBRIN DERIVATIVES D-DIMER (ARMC): 371.13 ng{FEU}/mL (ref 0.00–499.00)

## 2017-10-23 LAB — MAGNESIUM: MAGNESIUM: 2 mg/dL (ref 1.7–2.4)

## 2017-10-23 MED ORDER — AMIODARONE HCL IN DEXTROSE 360-4.14 MG/200ML-% IV SOLN: 30.0000 mg/h | INTRAVENOUS | Status: DC

## 2017-10-23 MED ORDER — ACETAMINOPHEN 650 MG RE SUPP: 650.0000 mg | Freq: Four times a day (QID) | RECTAL | Status: DC | PRN

## 2017-10-23 MED ORDER — ENOXAPARIN SODIUM 40 MG/0.4ML ~~LOC~~ SOLN
40.0000 mg | SUBCUTANEOUS | Status: DC
  Administered 2017-10-23: 40 mg via SUBCUTANEOUS
  Filled 2017-10-23: qty 0.4

## 2017-10-23 MED ORDER — POLYETHYLENE GLYCOL 3350 17 G PO PACK: 17.0000 g | PACK | Freq: Every day | ORAL | Status: DC | PRN

## 2017-10-23 MED ORDER — AMIODARONE LOAD VIA INFUSION
150.0000 mg | Freq: Once | INTRAVENOUS | Status: AC
  Administered 2017-10-23: 150 mg via INTRAVENOUS

## 2017-10-23 MED ORDER — LISINOPRIL 10 MG PO TABS
30.0000 mg | ORAL_TABLET | Freq: Every day | ORAL | Status: DC
  Administered 2017-10-23 – 2017-10-24 (×2): 30 mg via ORAL
  Filled 2017-10-23 (×2): qty 3

## 2017-10-23 MED ORDER — ASPIRIN EC 81 MG PO TBEC
81.0000 mg | DELAYED_RELEASE_TABLET | Freq: Every day | ORAL | Status: DC
  Administered 2017-10-24: 81 mg via ORAL
  Filled 2017-10-23: qty 1

## 2017-10-23 MED ORDER — NEBIVOLOL HCL 5 MG PO TABS
5.0000 mg | ORAL_TABLET | Freq: Every day | ORAL | Status: DC
  Administered 2017-10-23 – 2017-10-24 (×2): 5 mg via ORAL
  Filled 2017-10-23 (×3): qty 1

## 2017-10-23 MED ORDER — SODIUM CHLORIDE 0.9% FLUSH
3.0000 mL | Freq: Two times a day (BID) | INTRAVENOUS | Status: DC
  Administered 2017-10-23 – 2017-10-24 (×2): 3 mL via INTRAVENOUS

## 2017-10-23 MED ORDER — CLOPIDOGREL BISULFATE 75 MG PO TABS
75.0000 mg | ORAL_TABLET | Freq: Every day | ORAL | Status: DC
  Administered 2017-10-23 – 2017-10-24 (×2): 75 mg via ORAL
  Filled 2017-10-23 (×2): qty 1

## 2017-10-23 MED ORDER — ONDANSETRON HCL 4 MG/2ML IJ SOLN
4.0000 mg | Freq: Four times a day (QID) | INTRAMUSCULAR | Status: DC | PRN
Start: 2017-10-23 — End: 2017-10-24

## 2017-10-23 MED ORDER — ALBUTEROL SULFATE (2.5 MG/3ML) 0.083% IN NEBU: 2.5000 mg | INHALATION_SOLUTION | RESPIRATORY_TRACT | Status: DC | PRN

## 2017-10-23 MED ORDER — NITROGLYCERIN 0.4 MG SL SUBL: 0.4000 mg | SUBLINGUAL_TABLET | SUBLINGUAL | Status: DC | PRN

## 2017-10-23 MED ORDER — AMIODARONE HCL IN DEXTROSE 360-4.14 MG/200ML-% IV SOLN: 60.0000 mg/h | INTRAVENOUS | Status: DC

## 2017-10-23 MED ORDER — AMIODARONE IV BOLUS ONLY 150 MG/100ML: 150.0000 mg | Freq: Once | INTRAVENOUS | Status: DC

## 2017-10-23 MED ORDER — OMEGA-3-ACID ETHYL ESTERS 1 G PO CAPS
1.0000 g | ORAL_CAPSULE | Freq: Two times a day (BID) | ORAL | Status: DC
  Administered 2017-10-24: 1 g via ORAL
  Filled 2017-10-23: qty 1

## 2017-10-23 MED ORDER — SPIRONOLACTONE 25 MG PO TABS
25.0000 mg | ORAL_TABLET | Freq: Every day | ORAL | Status: DC
  Administered 2017-10-23 – 2017-10-24 (×2): 25 mg via ORAL
  Filled 2017-10-23 (×2): qty 1

## 2017-10-23 MED ORDER — AMIODARONE HCL IN DEXTROSE 360-4.14 MG/200ML-% IV SOLN
INTRAVENOUS | Status: AC
  Filled 2017-10-23: qty 200

## 2017-10-23 MED ORDER — ACETAMINOPHEN 325 MG PO TABS: 650.0000 mg | ORAL_TABLET | Freq: Four times a day (QID) | ORAL | Status: DC | PRN

## 2017-10-23 MED ORDER — ASPIRIN 81 MG PO CHEW
324.0000 mg | CHEWABLE_TABLET | Freq: Once | ORAL | Status: AC
  Administered 2017-10-23: 324 mg via ORAL

## 2017-10-23 MED ORDER — ASPIRIN 81 MG PO CHEW
CHEWABLE_TABLET | ORAL | Status: AC
  Administered 2017-10-23: 324 mg via ORAL
  Filled 2017-10-23: qty 4

## 2017-10-23 MED ORDER — FUROSEMIDE 40 MG PO TABS
20.0000 mg | ORAL_TABLET | Freq: Every day | ORAL | Status: DC
  Filled 2017-10-23: qty 1

## 2017-10-23 MED ORDER — ONDANSETRON HCL 4 MG PO TABS: 4.0000 mg | ORAL_TABLET | Freq: Four times a day (QID) | ORAL | Status: DC | PRN

## 2017-10-23 NOTE — ED Notes (Signed)
EDP to bedside to provide update.

## 2017-10-23 NOTE — ED Notes (Signed)
Pt with 16 beat run Vtach, pt alert but symptomatic at time of run with chest pain, states, "I feel like my heart was jumping around."  EDP notified and to bedside at this time.  Hospitalist paged to this RN.

## 2017-10-23 NOTE — Progress Notes (Signed)
Patient laying in bed with visitors at bedside. Patient complained of chest pain, but did not want nitro. He said he would "reposition and be okay". Repositioned patient, adjusted environment and lighting. Will continue to monitor patient.

## 2017-10-23 NOTE — Care Management Obs Status (Signed)
[place] NOTIFICATION   Patient Details  Name: Jose Dunlap MRN: [id number] Date of Birth: Jul 10, 1950   Medicare Observation Status Notification Given:  Yes    Marshell Garfinkel, RN 10/23/2017, 10:26 AM

## 2017-10-23 NOTE — ED Provider Notes (Addendum)
[facility] Emergency Department Provider Note    First MD Initiated Contact with Patient 10/23/17 [phone]     (approximate)  I have reviewed the triage vital signs and the nursing notes.   HISTORY  Chief Complaint Chest Pain    HPI Jose Dunlap is a 67 y.o. male with low-dose chronic medical conditions including myocardial infarction 2014 required 1 stent as well as AICD placement presents emergency department with acute onset of chest pain this morning patient currently states that pain score is mild. However patient states that pain waspain was an 8 out of 10 at onset. Patient denies any radiation of pain. Patient does admit to dyspnea as well as dizziness. Patient states that he had an episode of dizziness yesterday which resulted in him falling no head injury no loss of consciousness secondary to fall. Emotionally pain or swelling. Patient denies any personal  history of DVT or PE.  Past Medical History:  Diagnosis Date  . Essential hypertension   . Hyperlipidemia   . MI (myocardial infarction) ([place])   . Obesity   . Pacemaker   . Shortness of breath dyspnea     Patient Active Problem List   Diagnosis Date Noted  . Chest pain 10/23/2017  . CAD (coronary artery disease) 02/06/2017  . S/P angioplasty with stent 02/06/2017  . Environmental and seasonal allergies 11/06/2016  . Pedal edema 01/31/2016  . Pre-diabetes 01/31/2016  . Neuralgia of flank 06/23/2015  . Hyperlipidemia 04/19/2015  . Artificial pacemaker 04/19/2015  . Cardiac defibrillator in place 04/19/2015  . Abnormal weight gain 04/19/2015  . Essential hypertension 04/19/2015  . Artificial cardiac pacemaker 04/19/2015  . Incomplete bladder emptying 04/08/2014  . 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 Benign prostatic hyperplasia with urinary obstruction 01/21/2014  . Frank hematuria 01/21/2014  . Bladder retention 01/21/2014  . BPH with  obstruction/lower urinary tract symptoms 01/21/2014  . Automatic implantable cardioverter-defibrillator in situ 12/23/2013  . Hemorrhoid 10/15/2013  . History of non-ST elevation myocardial infarction (NSTEMI) 10/15/2013  . 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 ABDOMINAL HERNIA REPAIR  2001  . BACK SURGERY  1986  . COLONOSCOPY WITH PROPOFOL N/A 05/19/2015   Procedure: COLONOSCOPY WITH PROPOFOL;  Surgeon: Hulen Luster, MD;  Location[facility] ENDOSCOPY;  Service: Gastroenterology;  Laterality: N/A;  . CORONARY ANGIOPLASTY    . INSERT / REPLACE / REMOVE PACEMAKER    . KNEE ARTHROSCOPY    . PACEMAKER PLACEMENT    . PROSTATE SURGERY    . TONSILLECTOMY      Prior to Admission medications   Medication Sig Start Date End Date Taking? Authorizing Provider  aspirin 81 MG tablet Chew 81 mg by mouth daily. 03/24/14  Yes [provider]  Cholecalciferol (D 1000) 1000 units capsule Take 1 capsule by mouth daily.   Yes [provider]  clopidogrel (PLAVIX) 75 MG tablet Take 1 tablet (75 mg total) by mouth daily. 08/07/16  Yes Arlis Porta., MD  lisinopril (PRINIVIL,ZESTRIL) 30 MG tablet Take 1 tablet (30 mg total) by mouth daily. 02/06/17  Yes Karamalegos, Devonne Doughty, DO  Magnesium Cl-Calcium Carbonate (SLOW MAGNESIUM/CALCIUM) 70-117 MG TBEC Take 1 tablet by mouth daily.    Yes [provider]  Multiple Vitamin (MULTIVITAMIN) capsule Take 1 capsule by mouth daily.   Yes [provider]  nebivolol (BYSTOLIC)  5 MG tablet Take 1 tablet (5 mg total) by mouth daily. 09/10/17  Yes Karamalegos, Devonne Doughty, DO  nitroGLYCERIN (NITROSTAT) 0.4 MG SL tablet Place 1 tablet (0.4 mg total) under the tongue as needed. 02/03/16  Yes Arlis Porta., MD  Omega-3 Fatty Acids (FISH OIL PO) Take 1 capsule by mouth 2 (two) times daily.    Yes [provider]    spironolactone (ALDACTONE) 25 MG tablet Take 1 tablet (25 mg total) by mouth daily. 08/07/16  Yes Arlis Porta., MD  vitamin B-12 (CYANOCOBALAMIN) 250 MCG tablet Take 1 tablet by mouth daily.   Yes [provider]  Evolocumab (REPATHA SURECLICK) 213 MG/ML SOAJ Inject 140 mg into the skin every 14 (fourteen) days. Patient not taking: Reported on 08/13/2017 02/06/17   Olin Hauser, DO  furosemide (LASIX) 20 MG tablet Take 1 tablet (20 mg total) by mouth daily. Patient not taking: Reported on 10/23/2017 02/14/16   Arlis Porta., MD  Icosapent Ethyl (VASCEPA) 1 g CAPS Take 2 g by mouth 2 (two) times daily. Patient not taking: Reported on 10/23/2017 08/13/17   Olin Hauser, DO    Allergies no known drug allergies  History reviewed. No pertinent family history.  Social History Social History   Tobacco Use  . Smoking status: Former Research scientist (life sciences)  . Smokeless tobacco: Never Used  Substance Use Topics  . Alcohol use: Yes  . Drug use: No    Review of Systems Constitutional: No fever/chills Eyes: No visual changes. ENT: No sore throat. Cardiovascular: Positive for chest pain. Respiratory: positive for shortness of breath. Gastrointestinal: No abdominal pain.  No nausea, no vomiting.  No diarrhea.  No constipation. Genitourinary: Negative for dysuria. Musculoskeletal: Negative for neck pain.  Negative for back pain. Integumentary: Negative for rash. Neurological: Negative for headaches, focal weakness or numbness.   ____________________________________________   PHYSICAL EXAM:  VITAL SIGNS: ED Triage Vitals  Enc Vitals Group     BP 10/23/17 0734 (!) 151/84     Pulse Rate 10/23/17 0734 62     Resp 10/23/17 0734 18     Temp 10/23/17 0734 98.9 F (37.2 C)     Temp Source 10/23/17 0734 Oral     SpO2 10/23/17 0734 98 %     Weight 10/23/17 0733 111.1 kg (245 lb)     Height 10/23/17 0733 1.778 m (5\' 10" )     Head Circumference --      Peak Flow --       Pain Score 10/23/17 0732 4     Pain Loc --      Pain Edu? --      Excl. in [place]? --     Constitutional: Alert and oriented. Well appearing and in no acute distress. Eyes: Conjunctivae are normal. PERRL. EOMI. Head: Atraumatic. Mouth/Throat: Mucous membranes are moist.  Oropharynx non-erythematous. Neck: No stridor.   Cardiovascular: Normal rate, regular rhythm. Good peripheral circulation. Grossly normal heart sounds. Respiratory: Normal respiratory effort.  No retractions. Lungs CTAB. Gastrointestinal: Soft and nontender. No distention.  Musculoskeletal: No lower extremity tenderness nor edema. No gross deformities of extremities. Neurologic:  Normal speech and language. No gross focal neurologic deficits are appreciated.  Skin:  Skin is warm, dry and intact. No rash noted. Psychiatric: Mood and affect are normal. Speech and behavior are normal.  ____________________________________________   LABS (all labs ordered are listed, but only abnormal results are displayed)  Labs Reviewed  BASIC METABOLIC PANEL - Abnormal;  Notable for the following components:      Result Value   Glucose, Bld 110 (*)    All other components within normal limits  CBC - Abnormal; Notable for the following components:   RDW 14.9 (*)    All other components within normal limits  TROPONIN I  FIBRIN DERIVATIVES D-DIMER ([facility] ONLY)  MAGNESIUM  TROPONIN I  TROPONIN I   ____________________________________________  EKG  ED ECG REPORT I, [place] N Miel Wisener, the attending physician, personally viewed and interpreted this ECG.   Date: 10/23/2017  EKG Time: 7:32 AM  Rate: 63  Rhythm: AV pace rhythm  Axis: normal  Intervals:Normal  ST&T Change: none  ____________________________________________  RADIOLOGY I, [place] N Hayslee Casebolt, personally viewed and evaluated these images (plain radiographs) as part of my medical decision making, as well as reviewing the written report by the radiologist.  ED MD  interpretation:  no acute cardiopulmonary normal allergies per radiologist.  Official radiology report(s): Dg Chest Port 1 View  Result Date: 10/23/2017 CLINICAL DATA:  Chest pain. EXAM: PORTABLE CHEST 1 VIEW COMPARISON:  Radiographs of November 01, 2016. FINDINGS: Stable cardiomediastinal silhouette. Left-sided pacemaker is unchanged in position. Atherosclerosis of thoracic aorta is noted. No pneumothorax or pleural effusion is noted. No acute pulmonary disease is noted. Bony thorax is unremarkable. IMPRESSION: No acute cardiopulmonary abnormality seen. Aortic Atherosclerosis (ICD10-I70.0). Electronically Signed   By: Marijo Conception, M.D.   On: 10/23/2017 08:43      .Critical Care Performed by: Gregor Hams, MD Authorized by: Gregor Hams, MD   Critical care provider statement:    Critical care time (minutes):  30   Critical care time was exclusive of:  Separately billable procedures and treating other patients and teaching time   Critical care was necessary to treat or prevent imminent or life-threatening deterioration of the following conditions:  Cardiac failure   Critical care was time spent personally by me on the following activities:  Development of treatment plan with patient or surrogate, discussions with consultants, evaluation of patient's response to treatment, examination of patient, obtaining history from patient or surrogate, ordering and performing treatments and interventions, ordering and review of laboratory studies, ordering and review of radiographic studies, pulse oximetry, re-evaluation of patient's condition and review of old charts   I assumed direction of critical care for this patient from another provider in my specialty: no       ____________________________________________   INITIAL IMPRESSION / ASSESSMENT AND PLAN / ED COURSE  As part of my medical decision making, I reviewed the following data within the electronic MEDICAL RECORD NUMBER   67 year old  male presenting with a busted a history of physical exam secondary to chest pain. Concern for possible MI, EKG revealed no evidence of ST segment elevation. Laboratory data including troponin negative. Considered possibly pulmonary emboli as such d-dimer was obtained which was also negative. Patient with ongoing chest pain and a such concern for possible cardiac etiology still present. Patient was given aspirin 324 mg in the emergency department. Patient discussed with Dr. Sloan Leiter for hospital admission further evaluation and management.   10:30 AM:  after patient was admitted to the hospitalist staff but still in the emergency department patient had a 16 beat run of ventricular tachycardia. Patient maintained mentation and blood pressure at that time. Patient was given amiodarone bolus with infusion to follow.   ____________________________________________  FINAL CLINICAL IMPRESSION(S) / ED DIAGNOSES  Final diagnoses:  Chest pain, unspecified type  Ventricular tachycardia ([place])  MEDICATIONS GIVEN DURING THIS VISIT:  Medications  enoxaparin (LOVENOX) injection 40 mg (has no administration in time range)  sodium chloride flush (NS) 0.9 % injection 3 mL (has no administration in time range)  acetaminophen (TYLENOL) tablet 650 mg (has no administration in time range)    Or  acetaminophen (TYLENOL) suppository 650 mg (has no administration in time range)  polyethylene glycol (MIRALAX / GLYCOLAX) packet 17 g (has no administration in time range)  ondansetron (ZOFRAN) tablet 4 mg (has no administration in time range)    Or  ondansetron (ZOFRAN) injection 4 mg (has no administration in time range)  albuterol (PROVENTIL) (2.5 MG/3ML) 0.083% nebulizer solution 2.5 mg (has no administration in time range)  aspirin tablet 81 mg (has no administration in time range)  clopidogrel (PLAVIX) tablet 75 mg (has no administration in time range)  furosemide (LASIX) tablet 20 mg (has no administration in  time range)  lisinopril (PRINIVIL,ZESTRIL) tablet 30 mg (has no administration in time range)  nebivolol (BYSTOLIC) tablet 5 mg (has no administration in time range)  nitroGLYCERIN (NITROSTAT) SL tablet 0.4 mg (has no administration in time range)  omega-3 acid ethyl esters (LOVAZA) capsule 1 g (has no administration in time range)  spironolactone (ALDACTONE) tablet 25 mg (has no administration in time range)  aspirin chewable tablet 324 mg (324 mg Oral Given 10/23/17 0800)     ED Discharge Orders    None       Note:  This document was prepared using Dragon voice recognition software and may include unintentional dictation errors.    Gregor Hams, MD 10/23/17 1039    Gregor Hams, MD 10/23/17 1106

## 2017-10-23 NOTE — Consult Note (Signed)
Cardiology Consultation Note    Patient ID: Jose Dunlap, MRN: [id number], DOB/AGE: 13-Dec-1950 67 y.o. Admit date: 10/23/2017   Date of Consult: 10/23/2017 Primary Physician: Olin Hauser, DO Primary Cardiologist: Dr. Ubaldo Glassing  Chief Complaint: chest pain Reason for Consultation: chest pain Requesting MD: Dr. Darvin Neighbours  HPI: Jose Dunlap is a 67 y.o. male with history of of ischemic cardiomyopathy with known ef 30%, history of biventricular aicd [place], history of pci of left circumflex, chronically occluded rca and insignificant disease in the lad. He has been complianct with meds. He states he has felt somewhat different over the last several days. He states he has had episodes of feeling less well while working in his yard. Interogation of his pacemaker shows episodes of pacemaker mediated tachycardia whoevere there was no arrhythmia noted at the time of his admission. He has ruled out for an mi. He is currently being biv paced. He has been compliant with his meds.  He has been treated with asa, clopidogrel, lasix, lisinopril and bystolic. There have been no aicd firings. He remains fairly active.   Past Medical History:  Diagnosis Date  . Essential hypertension   . Hyperlipidemia   . MI (myocardial infarction) ([place])   . Obesity   . Pacemaker   . Shortness of breath dyspnea       Surgical History:  Past Surgical History:  Procedure Laterality Date  . ABDOMINAL HERNIA REPAIR  2001  . BACK SURGERY  1986  . COLONOSCOPY WITH PROPOFOL N/A 05/19/2015   Procedure: COLONOSCOPY WITH PROPOFOL;  Surgeon: Hulen Luster, MD;  Location: [facility] ENDOSCOPY;  Service: Gastroenterology;  Laterality: N/A;  . CORONARY ANGIOPLASTY    . INSERT / REPLACE / REMOVE PACEMAKER    . KNEE ARTHROSCOPY    . PACEMAKER PLACEMENT    . PROSTATE SURGERY    . TONSILLECTOMY       Home Meds: Prior to Admission medications   Medication Sig Start Date End Date Taking? Authorizing Provider  aspirin 81 MG  tablet Chew 81 mg by mouth daily. 03/24/14  Yes [provider]  Cholecalciferol (D 1000) 1000 units capsule Take 1 capsule by mouth daily.   Yes [provider]  clopidogrel (PLAVIX) 75 MG tablet Take 1 tablet (75 mg total) by mouth daily. 08/07/16  Yes Arlis Porta., MD  lisinopril (PRINIVIL,ZESTRIL) 30 MG tablet Take 1 tablet (30 mg total) by mouth daily. 02/06/17  Yes Karamalegos, Devonne Doughty, DO  Magnesium Cl-Calcium Carbonate (SLOW MAGNESIUM/CALCIUM) 70-117 MG TBEC Take 1 tablet by mouth daily.    Yes [provider]  Multiple Vitamin (MULTIVITAMIN) capsule Take 1 capsule by mouth daily.   Yes [provider]  nebivolol (BYSTOLIC) 5 MG tablet Take 1 tablet (5 mg total) by mouth daily. 09/10/17  Yes Karamalegos, Devonne Doughty, DO  nitroGLYCERIN (NITROSTAT) 0.4 MG SL tablet Place 1 tablet (0.4 mg total) under the tongue as needed. 02/03/16  Yes Arlis Porta., MD  Omega-3 Fatty Acids (FISH OIL PO) Take 1 capsule by mouth 2 (two) times daily.    Yes [provider]  spironolactone (ALDACTONE) 25 MG tablet Take 1 tablet (25 mg total) by mouth daily. 08/07/16  Yes Arlis Porta., MD  vitamin B-12 (CYANOCOBALAMIN) 250 MCG tablet Take 1 tablet by mouth daily.   Yes [provider]  Evolocumab (REPATHA SURECLICK) 182 MG/ML SOAJ Inject 140 mg into the skin every 14 (fourteen) days. Patient not taking:  Reported on 08/13/2017 02/06/17   Olin Hauser, DO  furosemide (LASIX) 20 MG tablet Take 1 tablet (20 mg total) by mouth daily. Patient not taking: Reported on 10/23/2017 02/14/16   Arlis Porta., MD  Icosapent Ethyl (VASCEPA) 1 g CAPS Take 2 g by mouth 2 (two) times daily. Patient not taking: Reported on 10/23/2017 08/13/17   Olin Hauser, DO    Inpatient Medications:  . [START ON 10/24/2017] aspirin EC  81 mg Oral Daily  . clopidogrel  75 mg Oral Daily  . enoxaparin (LOVENOX) injection  40 mg Subcutaneous Q24H  .  furosemide  20 mg Oral Daily  . lisinopril  30 mg Oral Daily  . nebivolol  5 mg Oral Daily  . [START ON 10/24/2017] omega-3 acid ethyl esters  1 g Oral BID  . sodium chloride flush  3 mL Intravenous Q12H  . spironolactone  25 mg Oral Daily     Allergies: No Known Allergies  Social History   Socioeconomic History  . Marital status: Divorced    Spouse name: Not on file  . Number of children: Not on file  . Years of education: Not on file  . Highest education level: Not on file  Occupational History  . Not on file  Social Needs  . Financial resource strain: Not on file  . Food insecurity:    Worry: Not on file    Inability: Not on file  . Transportation needs:    Medical: Not on file    Non-medical: Not on file  Tobacco Use  . Smoking status: Former Research scientist (life sciences)  . Smokeless tobacco: Never Used  Substance and Sexual Activity  . Alcohol use: Yes  . Drug use: No  . Sexual activity: Not on file  Lifestyle  . Physical activity:    Days per week: Not on file    Minutes per session: Not on file  . Stress: Not on file  Relationships  . Social connections:    Talks on phone: Not on file    Gets together: Not on file    Attends religious service: Not on file    Active member of club or organization: Not on file    Attends meetings of clubs or organizations: Not on file    Relationship status: Not on file  . Intimate partner violence:    Fear of current or ex partner: Not on file    Emotionally abused: Not on file    Physically abused: Not on file    Forced sexual activity: Not on file  Other Topics Concern  . Not on file  Social History Narrative  . Not on file     History reviewed. No pertinent family history.   Review of Systems: A 12-system review of systems was performed and is negative except as noted in the HPI.  Labs: Recent Labs    10/23/17 0731 10/23/17 1313 10/23/17 1912  TROPONINI <0.03 <0.03 <0.03   Lab Results  Component Value Date   WBC 7.7  10/23/2017   HGB 16.4 10/23/2017   HCT 47.0 10/23/2017   MCV 87.7 10/23/2017   PLT 168 10/23/2017    Recent Labs  Lab 10/23/17 0751  NA 137  K 4.5  CL 102  CO2 28  BUN 13  CREATININE 0.91  CALCIUM 9.1  GLUCOSE 110*   Lab Results  Component Value Date   CHOL 218 (H) 08/06/2017   HDL 36 (L) 08/06/2017   LDLCALC  08/06/2017  Comment:     . LDL cholesterol not calculated. Triglyceride levels greater than 400 mg/dL invalidate calculated LDL results. . Reference range: <100 . Desirable range <100 mg/dL for primary prevention;   <70 mg/dL for patients with CHD or diabetic patients  with > or = 2 CHD risk factors. Marland Kitchen LDL-C is now calculated using the Martin-Hopkins  calculation, which is a validated novel method providing  better accuracy than the Friedewald equation in the  estimation of LDL-C.  Cresenciano Genre et al. Annamaria Helling. 4098;119(14): 2061-2068  (http://education.[facility].com/faq/FAQ164)    TRIG 423 (H) 08/06/2017   No results found for: DDIMER  Radiology/Studies:  Dg Chest Port 1 View  Result Date: 10/23/2017 CLINICAL DATA:  Chest pain. EXAM: PORTABLE CHEST 1 VIEW COMPARISON:  Radiographs of November 01, 2016. FINDINGS: Stable cardiomediastinal silhouette. Left-sided pacemaker is unchanged in position. Atherosclerosis of thoracic aorta is noted. No pneumothorax or pleural effusion is noted. No acute pulmonary disease is noted. Bony thorax is unremarkable. IMPRESSION: No acute cardiopulmonary abnormality seen. Aortic Atherosclerosis (ICD10-I70.0). Electronically Signed   By: Marijo Conception, M.D.   On: 10/23/2017 08:43    Wt Readings from Last 3 Encounters:  10/23/17 112.1 kg (247 lb 1.6 oz)  08/13/17 115.7 kg (255 lb)  02/06/17 114.3 kg (252 lb)    EKG: intermitant atrial sense with ventricular paced and atrial paced and ventricular paced.   Physical Exam:  Blood pressure 137/82, pulse 60, temperature 97.6 F (36.4 C), temperature source Oral, resp. rate 18,  height 5\' 11"  (1.803 m), weight 112.1 kg (247 lb 1.6 oz), SpO2 96 %. Body mass index is 34.46 kg/m. General: Well developed, well nourished, in no acute distress. Head: Normocephalic, atraumatic, sclera non-icteric, no xanthomas, nares are without discharge.  Neck: Negative for carotid bruits. JVD not elevated. Lungs: Clear bilaterally to auscultation without wheezes, rales, or rhonchi. Breathing is unlabored. Heart: RRR with S1 S2. No murmurs, rubs, or gallops appreciated. Abdomen: Soft, non-tender, non-distended with normoactive bowel sounds. No hepatomegaly. No rebound/guarding. No obvious abdominal masses. Msk:  Strength and tone appear normal for age. Extremities: No clubbing or cyanosis. No edema.  Distal pedal pulses are 2+ and equal bilaterally. Neuro: Alert and oriented X 3. No facial asymmetry. No focal deficit. Moves all extremities spontaneously. Psych:  Responds to questions appropriately with a normal affect.     Assessment and Plan  67 yo male with history of ischemic cardiomyopathy with ef of 30%, history of biv aicd who was admitted after presenting to the er with complaints of chest pain and weakness and feeling poorly with chest tightness. EKG showed no abnormality but if v paced. He has ruled out for an mi. Hemodynamically stable. Will continue with current meds and proceed with functional study in am. AICD interogation revealed evidence of intermittent pmt but no vt. Further recs after functional study.   Signed, Teodoro Spray MD 10/23/2017, 8:28 PM Pager: [phone]

## 2017-10-23 NOTE — ED Notes (Addendum)
Lab notified to add on Magnesium.

## 2017-10-23 NOTE — H&P (Signed)
[facility] [place] NAME: Jose Dunlap    MR#:  [id number]  DATE OF BIRTH:  April 12, 1951  DATE OF ADMISSION:  10/23/2017  PRIMARY CARE PHYSICIAN: Olin Hauser, DO   REQUESTING/REFERRING PHYSICIAN: Dr. Owens Shark  CHIEF COMPLAINT:   Chief Complaint  Patient presents with  . Chest Pain    HISTORY OF PRESENT ILLNESS:  [name]  is a 67 y.o. male with a known history of CAD, PCI in 2014, hypertension, AICD presented to the emergency room due to chest pain starting overnight.  Patient 2 days back had elevated blood pressure and dizziness.  He continued his home medications and blood pressure improved.  Overnight he noticed that he is having recurrent chest pain/pressure along with uncontrolled blood pressure and presented to the emergency room.  Here patient's troponin is normal, EKG shows nothing acute.  Due to patient's chest pain and risk factors he is being admitted to the hospital to rule out acute coronary syndrome.  PAST MEDICAL HISTORY:   Past Medical History:  Diagnosis Date  . Essential hypertension   . Hyperlipidemia   . MI (myocardial infarction) ([place])   . Obesity   . Pacemaker   . Shortness of breath dyspnea     PAST SURGICAL HISTORY:   Past Surgical History:  Procedure Laterality Date  . ABDOMINAL HERNIA REPAIR  2001  . BACK SURGERY  1986  . COLONOSCOPY WITH PROPOFOL N/A 05/19/2015   Procedure: COLONOSCOPY WITH PROPOFOL;  Surgeon: Hulen Luster, MD;  Location: [facility] ENDOSCOPY;  Service: Gastroenterology;  Laterality: N/A;  . CORONARY ANGIOPLASTY    . INSERT / REPLACE / REMOVE PACEMAKER    . KNEE ARTHROSCOPY    . PACEMAKER PLACEMENT    . PROSTATE SURGERY    . TONSILLECTOMY      SOCIAL HISTORY:   Social History   Tobacco Use  . Smoking status: Former Research scientist (life sciences)  . Smokeless tobacco: Never Used  Substance Use Topics  . Alcohol use: Yes    FAMILY HISTORY:  History reviewed. No pertinent family  history.  DRUG ALLERGIES:  No Known Allergies  REVIEW OF SYSTEMS:   Review of Systems  Constitutional: Positive for malaise/fatigue. Negative for chills and fever.  HENT: Negative for sore throat.   Eyes: Negative for blurred vision, double vision and pain.  Respiratory: Positive for shortness of breath. Negative for cough, hemoptysis and wheezing.   Cardiovascular: Positive for chest pain. Negative for palpitations, orthopnea and leg swelling.  Gastrointestinal: Negative for abdominal pain, constipation, diarrhea, heartburn, nausea and vomiting.  Genitourinary: Negative for dysuria and hematuria.  Musculoskeletal: Negative for back pain and joint pain.  Skin: Negative for rash.  Neurological: Positive for dizziness. Negative for sensory change, speech change, focal weakness and headaches.  Endo/Heme/Allergies: Does not bruise/bleed easily.  Psychiatric/Behavioral: Negative for depression. The patient is not nervous/anxious.     MEDICATIONS AT HOME:   Prior to Admission medications   Medication Sig Start Date End Date Taking? Authorizing Provider  aspirin 81 MG tablet Chew 81 mg by mouth daily. 03/24/14  Yes [provider]  Cholecalciferol (D 1000) 1000 units capsule Take 1 capsule by mouth daily.   Yes [provider]  clopidogrel (PLAVIX) 75 MG tablet Take 1 tablet (75 mg total) by mouth daily. 08/07/16  Yes Arlis Porta., MD  lisinopril (PRINIVIL,ZESTRIL) 30 MG tablet Take 1 tablet (30 mg total) by mouth daily. 02/06/17  Yes Olin Hauser, DO  Magnesium Cl-Calcium Carbonate (SLOW MAGNESIUM/CALCIUM) 70-117 MG TBEC Take 1 tablet by mouth daily.    Yes [provider]  Multiple Vitamin (MULTIVITAMIN) capsule Take 1 capsule by mouth daily.   Yes [provider]  nebivolol (BYSTOLIC) 5 MG tablet Take 1 tablet (5 mg total) by mouth daily. 09/10/17  Yes Karamalegos, Devonne Doughty, DO  nitroGLYCERIN (NITROSTAT) 0.4 MG SL tablet Place 1 tablet  (0.4 mg total) under the tongue as needed. 02/03/16  Yes Arlis Porta., MD  Omega-3 Fatty Acids (FISH OIL PO) Take 1 capsule by mouth 2 (two) times daily.    Yes [provider]  spironolactone (ALDACTONE) 25 MG tablet Take 1 tablet (25 mg total) by mouth daily. 08/07/16  Yes Arlis Porta., MD  vitamin B-12 (CYANOCOBALAMIN) 250 MCG tablet Take 1 tablet by mouth daily.   Yes [provider]  Evolocumab (REPATHA SURECLICK) 277 MG/ML SOAJ Inject 140 mg into the skin every 14 (fourteen) days. Patient not taking: Reported on 08/13/2017 02/06/17   Olin Hauser, DO  furosemide (LASIX) 20 MG tablet Take 1 tablet (20 mg total) by mouth daily. Patient not taking: Reported on 10/23/2017 02/14/16   Arlis Porta., MD  Icosapent Ethyl (VASCEPA) 1 g CAPS Take 2 g by mouth 2 (two) times daily. Patient not taking: Reported on 10/23/2017 08/13/17   Olin Hauser, DO     VITAL SIGNS:  Blood pressure (!) 156/113, pulse (!) 59, temperature 98.9 F (37.2 C), temperature source Oral, resp. rate (!) 21, height 5\' 10"  (1.778 m), weight 111.1 kg (245 lb), SpO2 97 %.  PHYSICAL EXAMINATION:  Physical Exam  GENERAL:  67 y.o.-year-old patient lying in the bed with no acute distress.  EYES: Pupils equal, round, reactive to light and accommodation. No scleral icterus. Extraocular muscles intact.  HEENT: Head atraumatic, normocephalic. Oropharynx and nasopharynx clear. No oropharyngeal erythema, moist oral mucosa  NECK:  Supple, no jugular venous distention. No thyroid enlargement, no tenderness.  LUNGS: Normal breath sounds bilaterally, no wheezing, rales, rhonchi. No use of accessory muscles of respiration.  CARDIOVASCULAR: S1, S2 normal. No murmurs, rubs, or gallops.  ABDOMEN: Soft, nontender, nondistended. Bowel sounds present. No organomegaly or mass.  EXTREMITIES: No pedal edema, cyanosis, or clubbing. + 2 pedal & radial pulses b/l.   NEUROLOGIC: Cranial nerves II  through XII are intact. No focal Motor or sensory deficits appreciated b/l PSYCHIATRIC: The patient is alert and oriented x 3. Good affect.  SKIN: No obvious rash, lesion, or ulcer.   LABORATORY PANEL:   CBC Recent Labs  Lab 10/23/17 0751  WBC 7.7  HGB 16.4  HCT 47.0  PLT 168   ------------------------------------------------------------------------------------------------------------------  Chemistries  Recent Labs  Lab 10/23/17 0751  NA 137  K 4.5  CL 102  CO2 28  GLUCOSE 110*  BUN 13  CREATININE 0.91  CALCIUM 9.1  MG 2.0   ------------------------------------------------------------------------------------------------------------------  Cardiac Enzymes Recent Labs  Lab 10/23/17 0731  TROPONINI <0.03   ------------------------------------------------------------------------------------------------------------------  RADIOLOGY:  Dg Chest Port 1 View  Result Date: 10/23/2017 CLINICAL DATA:  Chest pain. EXAM: PORTABLE CHEST 1 VIEW COMPARISON:  Radiographs of November 01, 2016. FINDINGS: Stable cardiomediastinal silhouette. Left-sided pacemaker is unchanged in position. Atherosclerosis of thoracic aorta is noted. No pneumothorax or pleural effusion is noted. No acute pulmonary disease is noted. Bony thorax is unremarkable. IMPRESSION: No acute cardiopulmonary abnormality seen. Aortic Atherosclerosis (ICD10-I70.0). Electronically Signed   By: Marijo Conception, M.D.  On: 10/23/2017 08:43     IMPRESSION AND PLAN:   *Chest pain in patient with history of CAD.  No recent stress test or catheterization.  Patient continues to have mild chest pressure.  Troponin normal.  EKG shows no acute changes.  Will admit to telemetry monitoring.  Repeat troponin.  Nitro as needed.  Consult [facility] clinic cardiology for further input.  *Uncontrolled hypertension.  No recent change in medications.  Will restart home medications.  IV hydralazine added.  Further medication adjustment as per  blood pressure trend.  *Chronic systolic congestive heart failure.  Oral Lasix.  *DVT prophylaxis with Lovenox  All the records are reviewed and case discussed with ED provider. Management plans discussed with the patient, family and they are in agreement.  CODE STATUS: FULL CODE  TOTAL TIME TAKING CARE OF THIS PATIENT: 40 minutes.   Neita Carp M.D on 10/23/2017 at 10:39 AM  Between 7am to 6pm - Pager - [phone]  After 6pm go to [email or web address] - password [email or web address] [facility]  Office  [phone]  CC: Primary care physician; Olin Hauser, DO  Note: This dictation was prepared with Dragon dictation along with smaller phrase technology. Any transcriptional errors that result from this process are unintentional.

## 2017-10-23 NOTE — Care Management (Signed)
RNCM met with patient and fiance regarding [place] letter. He is independent form home; still works. He states he is in good standing with PCP "Dr. Raliegh Ip" at [place].  He states he gets most of his medications through mail order without any issues.  He denies any RNCM needs.

## 2017-10-23 NOTE — ED Triage Notes (Signed)
Pt here for chest pain that started yesterday. Was intermittent yesterday, more constant today.  Has been dizzy and fell yesterday r/t dizziness.  Has also had some SHOB with pain.  Unlabored currently.  Hx MI and pacer/defib.

## 2017-10-23 NOTE — Progress Notes (Signed)
.  kfin

## 2017-10-24 ENCOUNTER — Observation Stay: Payer: PPO

## 2017-10-24 DIAGNOSIS — I2510 Atherosclerotic heart disease of native coronary artery without angina pectoris: Secondary | ICD-10-CM | POA: Diagnosis not present

## 2017-10-24 DIAGNOSIS — R079 Chest pain, unspecified: Secondary | ICD-10-CM | POA: Diagnosis not present

## 2017-10-24 DIAGNOSIS — I10 Essential (primary) hypertension: Secondary | ICD-10-CM | POA: Diagnosis not present

## 2017-10-24 DIAGNOSIS — I25119 Atherosclerotic heart disease of native coronary artery with unspecified angina pectoris: Secondary | ICD-10-CM | POA: Diagnosis not present

## 2017-10-24 MED ORDER — REGADENOSON 0.4 MG/5ML IV SOLN
0.4000 mg | Freq: Once | INTRAVENOUS | Status: AC
  Administered 2017-10-24: 0.4 mg via INTRAVENOUS

## 2017-10-24 MED ORDER — TECHNETIUM TC 99M TETROFOSMIN IV KIT
32.9200 | PACK | Freq: Once | INTRAVENOUS | Status: AC | PRN
  Administered 2017-10-24: 32.92 via INTRAVENOUS

## 2017-10-24 MED ORDER — TECHNETIUM TC 99M TETROFOSMIN IV KIT
13.8800 | PACK | Freq: Once | INTRAVENOUS | Status: AC | PRN
  Administered 2017-10-24: 13.88 via INTRAVENOUS

## 2017-10-24 NOTE — Discharge Instructions (Signed)
Resume diet and activity as before ° ° °

## 2017-10-24 NOTE — Progress Notes (Signed)
Pt refused his bed alarm, but was educated about safety. Will continue to monitor.

## 2017-10-24 NOTE — Care Management (Signed)
Spoke with Dr. Darvin Neighbours. States that Mr. Is having a stress test at this time. If stress test is negative, may discharge home later today. Shelbie Ammons RN MSN CCM Care Management [phone]

## 2017-10-24 NOTE — Plan of Care (Signed)
  Problem: Clinical Measurements: Goal: Will remain free from infection Outcome: Progressing   Problem: Pain Managment: Goal: General experience of comfort will improve Outcome: Progressing   Problem: Safety: Goal: Ability to remain free from injury will improve Outcome: Progressing   Problem: Cardiac: Goal: Ability to achieve and maintain adequate cardiovascular perfusion will improve Outcome: Progressing

## 2017-10-24 NOTE — Progress Notes (Signed)
Patient Name: Jose Dunlap Date of Encounter: 10/24/2017  Hospital Problem List     Active Problems:   Chest pain    Patient Profile     67-year-old male with ischemic cardiomyopathy status post PCI left circumflex with an occluded RCA PCI in the left circumflex with insignificant disease in his LAD.  He has an EF 30 to 35%.  Somewhat lightheaded no chest pain.  Has an AICD in place.  AICD is functioning normally.  Functional study showed fixed inferolateral defect with no significant reversibility.  Subjective   No somewhat lightheaded but no chest pain or shortness of breath  Inpatient Medications    . aspirin EC  81 mg Oral Daily  . clopidogrel  75 mg Oral Daily  . enoxaparin (LOVENOX) injection  40 mg Subcutaneous Q24H  . furosemide  20 mg Oral Daily  . lisinopril  30 mg Oral Daily  . nebivolol  5 mg Oral Daily  . omega-3 acid ethyl esters  1 g Oral BID  . sodium chloride flush  3 mL Intravenous Q12H  . spironolactone  25 mg Oral Daily    Vital Signs    Vitals:   10/23/17 1602 10/23/17 1936 10/24/17 0401 10/24/17 0947  BP: (!) 143/91 137/82 129/76 (!) 145/85  Pulse: 63 60 60 61  Resp:  18 18   Temp: 97.8 F (36.6 C) 97.6 F (36.4 C) 98.1 F (36.7 C) 98 F (36.7 C)  TempSrc: Oral Oral  Oral  SpO2: 96% 96% 96% 95%  Weight:   110.5 kg (243 lb 11.2 oz)   Height:        Intake/Output Summary (Last 24 hours) at 10/24/2017 1626 Last data filed at 10/24/2017 1409 Gross per 24 hour  Intake 480 ml  Output 1200 ml  Net -720 ml   Filed Weights   10/23/17 0733 10/23/17 1138 10/24/17 0401  Weight: 111.1 kg (245 lb) 112.1 kg (247 lb 1.6 oz) 110.5 kg (243 lb 11.2 oz)    Physical Exam    GEN: Well nourished, well developed, in no acute distress.  HEENT: normal.  Neck: Supple, no JVD, carotid bruits, or masses. Cardiac: RRR, no murmurs, rubs, or gallops. No clubbing, cyanosis, edema.  Radials/DP/PT 2+ and equal bilaterally.  Respiratory:  Respirations regular  and unlabored, clear to auscultation bilaterally. GI: Soft, nontender, nondistended, BS + x 4. MS: no deformity or atrophy. Skin: warm and dry, no rash. Neuro:  Strength and sensation are intact. Psych: Normal affect.  Labs    CBC Recent Labs    10/23/17 0751  WBC 7.7  HGB 16.4  HCT 47.0  MCV 87.7  PLT 619   Basic Metabolic Panel Recent Labs    10/23/17 0751  NA 137  K 4.5  CL 102  CO2 28  GLUCOSE 110*  BUN 13  CREATININE 0.91  CALCIUM 9.1  MG 2.0   Liver Function Tests No results for input(s): AST, ALT, ALKPHOS, BILITOT, PROT, ALBUMIN in the last 72 hours. No results for input(s): LIPASE, AMYLASE in the last 72 hours. Cardiac Enzymes Recent Labs    10/23/17 0731 10/23/17 1313 10/23/17 1912  TROPONINI <0.03 <0.03 <0.03   BNP No results for input(s): BNP in the last 72 hours. D-Dimer No results for input(s): DDIMER in the last 72 hours. Hemoglobin A1C No results for input(s): HGBA1C in the last 72 hours. Fasting Lipid Panel No results for input(s): CHOL, HDL, LDLCALC, TRIG, CHOLHDL, LDLDIRECT in the last 72 hours.  Thyroid Function Tests No results for input(s): TSH, T4TOTAL, T3FREE, THYROIDAB in the last 72 hours.  Invalid input(s): FREET3  Telemetry    Atrial sensed ventricular  ECG    Atrial sensed ventricular paced  Radiology    Dg Chest Port 1 View  Result Date: 10/23/2017 CLINICAL DATA:  Chest pain. EXAM: PORTABLE CHEST 1 VIEW COMPARISON:  Radiographs of November 01, 2016. FINDINGS: Stable cardiomediastinal silhouette. Left-sided pacemaker is unchanged in position. Atherosclerosis of thoracic aorta is noted. No pneumothorax or pleural effusion is noted. No acute pulmonary disease is noted. Bony thorax is unremarkable. IMPRESSION: No acute cardiopulmonary abnormality seen. Aortic Atherosclerosis (ICD10-I70.0). Electronically Signed   By: Marijo Conception, M.D.   On: 10/23/2017 08:43    Assessment & Plan    Rule out for myocardial infarction.   Functional study showed fixed inferolateral defect with no significant reversibility.  Would continue with current regimen and discharged home.  We will follow-up as an outpatient for further evaluation  AICD-AICD is functioning normally.  No significant tachycardia or bradycardia arrhythmias.  We will continue to follow in the office.  Dizziness-may have been relative volume depletion as well as inner ear due to pollen.  We will continue with current regimen.  Okay for discharge on current regimen.  We will follow-up in 1 to 2 weeks in the office.  Signed, Javier Docker Suvi Archuletta MD 10/24/2017, 4:26 PM  Pager: [phone]

## 2017-10-24 NOTE — Progress Notes (Signed)
Pt d/c to home today.  IV removed intact.  D/c paperwork printed and reviewed w/pt.  All medication questions and concerns reviewed and pt states understanding.  All Rx's given to patient. Pt requested to walk out for d/c.     

## 2017-10-25 ENCOUNTER — Telehealth: Payer: Self-pay

## 2017-10-25 NOTE — Telephone Encounter (Signed)
I have made the 1st attempt to contact the patient or family member in charge, in order to follow up from recently being discharged from the hospital. I left a message on voicemail but I will make another attempt at a different time.  

## 2017-10-26 LAB — NM MYOCAR MULTI W/SPECT W/WALL MOTION / EF
CHL CUP MPHR: 152 {beats}/min
CHL CUP NUCLEAR SDS: 3
CHL CUP NUCLEAR SRS: 19
CSEPED: 1 min
Estimated workload: 1 METS
Exercise duration (sec): 0 s
LVDIAVOL: 210 mL (ref 62–150)
LVSYSVOL: 116 mL
NUC STRESS TID: 1.11
Peak HR: 95 {beats}/min
Percent HR: 62 %
Rest HR: 60 {beats}/min
SSS: 17

## 2017-10-28 ENCOUNTER — Ambulatory Visit (INDEPENDENT_AMBULATORY_CARE_PROVIDER_SITE_OTHER): Payer: PPO | Admitting: Family Medicine

## 2017-10-28 ENCOUNTER — Encounter: Payer: Self-pay | Admitting: Family Medicine

## 2017-10-28 VITALS — BP 136/79 | HR 77 | Temp 98.0°F | Resp 16 | Ht 71.0 in | Wt 242.0 lb

## 2017-10-28 DIAGNOSIS — R079 Chest pain, unspecified: Secondary | ICD-10-CM

## 2017-10-28 DIAGNOSIS — I2510 Atherosclerotic heart disease of native coronary artery without angina pectoris: Secondary | ICD-10-CM

## 2017-10-28 DIAGNOSIS — H8110 Benign paroxysmal vertigo, unspecified ear: Secondary | ICD-10-CM | POA: Diagnosis not present

## 2017-10-28 NOTE — Assessment & Plan Note (Signed)
Resolved. Ruled out MI, cardiac work-up w/ nuclear stress

## 2017-10-28 NOTE — Assessment & Plan Note (Signed)
Resolved CP. Recent CP was ruled out MI. Stable without evidence of recent ischemia Known CAD s/p prior NSTEMI with stent placement with complication ischemic cardiomyopathy On BB, ASA, Plavix (DAPT), ACEi Failed Statin therapy Followed by Cardiology - recommend that he may qualify now for Repatha, as per update from drug rep we may be able to get him covered with low cost, he can review with Cardiology once more

## 2017-10-28 NOTE — Patient Instructions (Addendum)
Thank you for coming to the office today.  1. You have symptoms of Vertigo (Benign Paroxysmal Positional Vertigo) - This is commonly caused by inner ear fluid imbalance, sometimes can be worsened by allergies and sinus symptoms, otherwise it can occur randomly sometimes and we may never discover the exact cause. - To treat this, try the Epley Manuever (see diagrams/instructions below) at home up to 3 times a day for 1-2 weeks or until symptoms resolve - You may take Meclizine as needed up to 3 times a day for dizziness, this will not cure symptoms but may help. Caution may make you drowsy.  Ask Dr Ubaldo Glassing about Repatha again, I do believe it could be approved for low cost.  If you develop significant worsening episode with vertigo that does not improve and you get severe headache, loss of vision, arm or leg weakness, slurred speech, or other concerning symptoms please seek immediate medical attention at Emergency Department.  Please schedule a follow-up appointment with Dr Parks Ranger within 2-4 weeks if Vertigo not improving, and will consider Referral to Vestibular Rehab OR ENT  See the next page for images describing the Epley Manuever.     ----------------------------------------------------------------------------------------------------------------------       Please schedule a Follow-up Appointment to: Return in about 2 weeks (around 11/11/2017), or if symptoms worsen or fail to improve, for Vertigo.  If you have any other questions or concerns, please feel free to call the office or send a message through [place]. You may also schedule an earlier appointment if necessary.  Additionally, you may be receiving a survey about your experience at our office within a few days to 1 week by e-mail or mail. We value your feedback.  Nobie Putnam, DO [place]

## 2017-10-28 NOTE — Progress Notes (Signed)
Subjective:    Patient ID: Jose Dunlap, male    DOB: 13-Nov-1950, 67 y.o.   MRN: [id number]  Jose Dunlap is a 67 y.o. male presenting on 10/28/2017 for Hospitalization Follow-up (chest pain) and Dizziness   HPI   HOSPITAL FOLLOW-UP VISIT  Hospital/Location: [facility] Date of Admission: 10/23/17 Date of Discharge: 10/24/17 Transitions of care telephone call: Attempted call on 10/25/17 by [facility] LPN, did not reach patient, and he called back but did not connect. Call not completed.  Reason for Admission: Chest Pain, Dizziness Primary (+Secondary) Diagnosis: Atypical Chest Pain (non cardiac), Vertigo  FOLLOW-UP  - Hospital H&P and Discharge Summary have been reviewed - Patient presents today about 4 days after recent hospitalization. Brief summary of recent course, patient had symptoms of 1-2 days of dizziness episodes similar to vertigo, and elevated BP could not improve and chest pains, he was taken to ED by his fiance, hospitalized, treated with testing showed normal troponin and labs and EKG was unremarkable, he was treated with Nitro drip and ultimately his symptoms have resolved. He was seen by Cardiology Dr Ubaldo Glassing, he has history of pacemaker and biv paced, has Defib. He was ruled out for MI. 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 He was discharged to home.  - Today reports overall has done well after discharge. Symptoms of chest pain and palpitations have resolved - Now he has persistent dizziness symptoms since leaving hospital, seems to be constant dizziness, without resolution. Nothing seems to help it or worsen it. He has tried Ecologist without improvement. He does have some episodic worsening spells, uncertain trigger, seems acute episodes. He had similar issue with vertigo back in 1990s, he was treated at that time with nasal steroid and it  resolved. - Admits off balance without room spinning - Denies active chest pain or dyspnea, palpitations, nausea vomiting, headache, vision change  - New medications on discharge: None - Changes to current meds on discharge: None    Depression screen [facility] 2/9 08/13/2017 02/06/2017 11/21/2015  Decreased Interest 0 0 0  Down, Depressed, Hopeless 0 0 0  PHQ - 2 Score 0 0 0    Social History   Tobacco Use  . Smoking status: Former Research scientist (life sciences)  . Smokeless tobacco: Never Used  Substance Use Topics  . Alcohol use: Yes  . Drug use: No    Review of Systems Per HPI unless specifically indicated above     Objective:    BP 136/79   Pulse 77   Temp 98 F (36.7 C) (Oral)   Resp 16   Ht 5\' 11"  (1.803 m)   Wt 242 lb (109.8 kg)   SpO2 95%   BMI 33.75 kg/m   Wt Readings from Last 3 Encounters:  10/28/17 242 lb (109.8 kg)  10/24/17 243 lb 11.2 oz (110.5 kg)  08/13/17 255 lb (115.7 kg)    Physical Exam  Constitutional: He is oriented to person, place, and time. He appears well-developed and well-nourished. No distress.  Well-appearing, comfortable, cooperative  HENT:  Head: Normocephalic and atraumatic.  Mouth/Throat: Oropharynx is clear and moist.  Frontal / maxillary sinuses non-tender. Nares patent without purulence or edema. Bilateral TMs clear without erythema, effusion or bulging, removed R hearing aid. Oropharynx clear without erythema, exudates, edema or asymmetry.  Dix-hallpike maneuver bilateral, negative without provoked vertigo or  nystagmus.  Eyes: Conjunctivae are normal. Right eye exhibits no discharge. Left eye exhibits no discharge.  Neck: Normal range of motion. Neck supple. No thyromegaly present.  Cardiovascular: Normal rate, regular rhythm, normal heart sounds and intact distal pulses.  No murmur heard. Pulmonary/Chest: Effort normal and breath sounds normal. No respiratory distress. He has no wheezes. He has no rales.  Musculoskeletal: Normal range of motion. He  exhibits no edema.  Lymphadenopathy:    He has no cervical adenopathy.  Neurological: He is alert and oriented to person, place, and time.  Skin: Skin is warm and dry. No rash noted. He is not diaphoretic. No erythema.  Psychiatric: He has a normal mood and affect. His behavior is normal.  Well groomed, good eye contact, normal speech and thoughts  Nursing note and vitals reviewed.  Results for orders placed or performed during the hospital encounter of 10/23/17  NM Myocar Multi W/Spect W/Wall Motion / EF  Result Value Ref Range   Rest HR 60 bpm   Rest BP 122/74 mmHg   Exercise duration (sec) 0 sec   Percent HR 62 %   Exercise duration (min) 1 min   Estimated workload 1.0 METS   Peak HR 95 bpm   Peak BP 118/77 mmHg   MPHR 152 bpm   SSS 17    SRS 19    SDS 3    TID 1.11    LV sys vol 116 mL   LV dias vol 210 62 - 150 mL  Basic metabolic panel  Result Value Ref Range   Sodium 137 135 - 145 mmol/L   Potassium 4.5 3.5 - 5.1 mmol/L   Chloride 102 101 - 111 mmol/L   CO2 28 22 - 32 mmol/L   Glucose, Bld 110 (H) 65 - 99 mg/dL   BUN 13 6 - 20 mg/dL   Creatinine, Ser 0.91 0.61 - 1.24 mg/dL   Calcium 9.1 8.9 - 10.3 mg/dL   GFR calc non Af Amer >60 >60 mL/min   GFR calc Af Amer >60 >60 mL/min   Anion gap 7 5 - 15  CBC  Result Value Ref Range   WBC 7.7 3.8 - 10.6 K/uL   RBC 5.36 4.40 - 5.90 MIL/uL   Hemoglobin 16.4 13.0 - 18.0 g/dL   HCT 47.0 40.0 - 52.0 %   MCV 87.7 80.0 - 100.0 fL   MCH 30.5 26.0 - 34.0 pg   MCHC 34.8 32.0 - 36.0 g/dL   RDW 14.9 (H) 11.5 - 14.5 %   Platelets 168 150 - 440 K/uL  Troponin I  Result Value Ref Range   Troponin I <0.03 <0.03 ng/mL  Fibrin derivatives D-Dimer  Result Value Ref Range   Fibrin derivatives D-dimer ([facility]) 371.13 0.00 - 499.00 ng/mL (FEU)  Magnesium  Result Value Ref Range   Magnesium 2.0 1.7 - 2.4 mg/dL  Troponin I  Result Value Ref Range   Troponin I <0.03 <0.03 ng/mL  Troponin I  Result Value Ref Range   Troponin I <0.03  <0.03 ng/mL      Assessment & Plan:   Problem List Items Addressed This Visit    CAD (coronary artery disease)    Resolved CP. Recent CP was ruled out MI. Stable without evidence of recent ischemia Known CAD s/p prior NSTEMI with stent placement with complication ischemic cardiomyopathy On BB, ASA, Plavix (DAPT), ACEi Failed Statin therapy Followed by Cardiology - recommend that he may qualify now for Repatha, as per update  from drug rep we may be able to get him covered with low cost, he can review with Cardiology once more      RESOLVED: Chest pain    Resolved. Ruled out MI, cardiac work-up w/ nuclear stress       Other Visit Diagnoses    Benign paroxysmal positional vertigo, unspecified laterality    -  Primary     Suspected R>L-BPPV by history supported by prior history vertigo and current symptoms, however seems less likely episodic vertigo vs more sustained dizziness. - No focal neuro deficits or other clues - Does not seem to be related to medication or other factors - Presume may be related to allergies or rhinosinusitis possibly inner ear but does not seem to have acute sinusitis or infection - seems unrelated to chest pain episode  Plan: 1. Handout given with Epley maneuver TID for 1-2 weeks until resolved 2. Trial meclizine PRN for breakthrough symptoms 3. Return criteria, if not improved consider vestibular PT referral vs ENT referral   No orders of the defined types were placed in this encounter.  I have reviewed the discharge medication list, and have reconciled the current and discharge medications today.   Current Outpatient Medications:  .  aspirin 81 MG tablet, Chew 81 mg by mouth daily., Disp: , Rfl:  .  Cholecalciferol (D 1000) 1000 units capsule, Take 1 capsule by mouth daily., Disp: , Rfl:  .  clopidogrel (PLAVIX) 75 MG tablet, Take 1 tablet (75 mg total) by mouth daily., Disp: 90 tablet, Rfl: 3 .  furosemide (LASIX) 20 MG tablet, Take 1 tablet (20 mg  total) by mouth daily., Disp: 90 tablet, Rfl: 1 .  Icosapent Ethyl (VASCEPA) 1 g CAPS, Take 2 g by mouth 2 (two) times daily., Disp: 120 capsule, Rfl: 5 .  lisinopril (PRINIVIL,ZESTRIL) 30 MG tablet, Take 1 tablet (30 mg total) by mouth daily., Disp: 90 tablet, Rfl: 3 .  Magnesium Cl-Calcium Carbonate (SLOW MAGNESIUM/CALCIUM) 70-117 MG TBEC, Take 1 tablet by mouth daily. , Disp: , Rfl:  .  Multiple Vitamin (MULTIVITAMIN) capsule, Take 1 capsule by mouth daily., Disp: , Rfl:  .  nebivolol (BYSTOLIC) 5 MG tablet, Take 1 tablet (5 mg total) by mouth daily., Disp: 90 tablet, Rfl: 1 .  nitroGLYCERIN (NITROSTAT) 0.4 MG SL tablet, Place 1 tablet (0.4 mg total) under the tongue as needed., Disp: 25 tablet, Rfl: 4 .  Omega-3 Fatty Acids (FISH OIL PO), Take 1 capsule by mouth 2 (two) times daily. , Disp: , Rfl:  .  spironolactone (ALDACTONE) 25 MG tablet, Take 1 tablet (25 mg total) by mouth daily., Disp: 90 tablet, Rfl: 3 .  vitamin B-12 (CYANOCOBALAMIN) 250 MCG tablet, Take 1 tablet by mouth daily., Disp: , Rfl:    Follow up plan: Return in about 2 weeks (around 11/11/2017), or if symptoms worsen or fail to improve, for Vertigo.   Nobie Putnam, [facility] 10/28/2017, 10:30 PM

## 2017-10-29 ENCOUNTER — Inpatient Hospital Stay: Payer: PPO | Admitting: Family Medicine

## 2017-11-04 ENCOUNTER — Inpatient Hospital Stay: Payer: PPO | Admitting: Family Medicine

## 2017-11-06 NOTE — Discharge Summary (Signed)
[place] [facility] [place] NAME: Jose Dunlap    MR#:  [id number]  DATE OF BIRTH:  12-May-1951  DATE OF ADMISSION:  10/23/2017 ADMITTING PHYSICIAN: Hillary Bow, MD  DATE OF DISCHARGE: 10/24/2017  6:14 PM  PRIMARY CARE PHYSICIAN: Olin Hauser, DO   ADMISSION DIAGNOSIS:  Ventricular tachycardia ([place]) [I47.2] Chest pain, unspecified type [R07.9]  DISCHARGE DIAGNOSIS:  Active Problems:   * No active hospital problems. *   SECONDARY DIAGNOSIS:   Past Medical History:  Diagnosis Date  . Essential hypertension   . Hyperlipidemia   . MI (myocardial infarction) ([place])   . Obesity   . Pacemaker   . Shortness of breath dyspnea      ADMITTING HISTORY  HISTORY OF PRESENT ILLNESS:  Jose Dunlap  is a 67 y.o. male with a known history of CAD, PCI in 2014, hypertension, AICD presented to the emergency room due to chest pain starting overnight.  Patient 2 days back had elevated blood pressure and dizziness.  He continued his home medications and blood pressure improved.  Overnight he noticed that he is having recurrent chest pain/pressure along with uncontrolled blood pressure and presented to the emergency room.  Here patient's troponin is normal, EKG shows nothing acute.  Due to patient's chest pain and risk factors he is being admitted to the hospital to rule out acute coronary syndrome.  HOSPITAL COURSE:   *Chest pain.  This was thought to be likely due to tachycardia found on AICD interrogation.  No ventricular tachycardia found.  Seen by cardiology Dr. fat with home patient will follow-up as outpatient.  Mild dizziness is improved.  No change in home medications at this time.  Patient feels close to normal.  Follow-up with cardiology and PCP.  Stable for discharge home.  CONSULTS OBTAINED:  Treatment Team:  Teodoro Spray, MD  DRUG ALLERGIES:  No Known Allergies  DISCHARGE MEDICATIONS:   Allergies as of 10/24/2017   No Known  Allergies     Medication List    TAKE these medications   aspirin 81 MG tablet Chew 81 mg by mouth daily.   clopidogrel 75 MG tablet Commonly known as:  PLAVIX Take 1 tablet (75 mg total) by mouth daily.   D 1000 1000 units capsule Generic drug:  Cholecalciferol Take 1 capsule by mouth daily.   FISH OIL PO Take 1 capsule by mouth 2 (two) times daily.   furosemide 20 MG tablet Commonly known as:  LASIX Take 1 tablet (20 mg total) by mouth daily.   Icosapent Ethyl 1 g Caps Commonly known as:  VASCEPA Take 2 g by mouth 2 (two) times daily.   lisinopril 30 MG tablet Commonly known as:  PRINIVIL,ZESTRIL Take 1 tablet (30 mg total) by mouth daily.   multivitamin capsule Take 1 capsule by mouth daily.   nebivolol 5 MG tablet Commonly known as:  BYSTOLIC Take 1 tablet (5 mg total) by mouth daily.   nitroGLYCERIN 0.4 MG SL tablet Commonly known as:  NITROSTAT Place 1 tablet (0.4 mg total) under the tongue as needed.   SLOW MAGNESIUM/CALCIUM 70-117 MG Tbec Generic drug:  Magnesium Cl-Calcium Carbonate Take 1 tablet by mouth daily.   spironolactone 25 MG tablet Commonly known as:  ALDACTONE Take 1 tablet (25 mg total) by mouth daily.   vitamin B-12 250 MCG tablet Commonly known as:  CYANOCOBALAMIN Take 1 tablet by mouth daily.       Today   VITAL  SIGNS:  Blood pressure (!) 145/85, pulse 61, temperature 98 F (36.7 C), temperature source Oral, resp. rate 18, height 5\' 11"  (1.803 m), weight 110.5 kg (243 lb 11.2 oz), SpO2 95 %.  I/O:  No intake or output data in the 24 hours ending 11/06/17 1159  PHYSICAL EXAMINATION:  Physical Exam  GENERAL:  67 y.o.-year-old patient lying in the bed with no acute distress.  LUNGS: Normal breath sounds bilaterally, no wheezing, rales,rhonchi or crepitation. No use of accessory muscles of respiration.  CARDIOVASCULAR: S1, S2 normal. No murmurs, rubs, or gallops.  ABDOMEN: Soft, non-tender, non-distended. Bowel sounds  present. No organomegaly or mass.  NEUROLOGIC: Moves all 4 extremities. PSYCHIATRIC: The patient is alert and oriented x 3.  SKIN: No obvious rash, lesion, or ulcer.   DATA REVIEW:   CBC No results for input(s): WBC, HGB, HCT, PLT in the last 168 hours.  Chemistries  No results for input(s): NA, K, CL, CO2, GLUCOSE, BUN, CREATININE, CALCIUM, MG, AST, ALT, ALKPHOS, BILITOT in the last 168 hours.  Invalid input(s): GFRCGP  Cardiac Enzymes No results for input(s): TROPONINI in the last 168 hours.  Microbiology Results  No results found for this or any previous visit.  RADIOLOGY:  No results found.  Follow up with PCP in 1 week.  Management plans discussed with the patient, family and they are in agreement.  CODE STATUS:  Code Status History    Date Active Date Inactive Code Status Order ID Comments User Context   10/23/2017 1024 10/24/2017 2119 Full Code [id number]  Hillary Bow, MD ED      TOTAL TIME TAKING CARE OF THIS PATIENT ON DAY OF DISCHARGE: more than 30 minutes.   Leia Alf Lizann Edelman M.D on 11/06/2017 at 11:59 AM  Between 7am to 6pm - Pager - [phone]  After 6pm go to [email or web address] - password [email or web address] [facility]  Office  [phone]  CC: Primary care physician; Olin Hauser, DO  Note: This dictation was prepared with Dragon dictation along with smaller phrase technology. Any transcriptional errors that result from this process are unintentional.

## 2017-12-03 DIAGNOSIS — I255 Ischemic cardiomyopathy: Secondary | ICD-10-CM | POA: Diagnosis not present

## 2017-12-03 DIAGNOSIS — I259 Chronic ischemic heart disease, unspecified: Secondary | ICD-10-CM | POA: Diagnosis not present

## 2017-12-03 DIAGNOSIS — I10 Essential (primary) hypertension: Secondary | ICD-10-CM | POA: Diagnosis not present

## 2017-12-03 DIAGNOSIS — I517 Cardiomegaly: Secondary | ICD-10-CM | POA: Diagnosis not present

## 2018-03-14 ENCOUNTER — Other Ambulatory Visit: Payer: Self-pay | Admitting: Family Medicine

## 2018-03-14 DIAGNOSIS — I10 Essential (primary) hypertension: Secondary | ICD-10-CM

## 2018-03-14 MED ORDER — LISINOPRIL 30 MG PO TABS: 30.0000 mg | ORAL_TABLET | Freq: Every day | ORAL | 3 refills | Status: DC

## 2018-03-14 NOTE — Telephone Encounter (Signed)
Pt needs a refill on lisinopril sent to [facility]

## 2018-03-25 DIAGNOSIS — I517 Cardiomegaly: Secondary | ICD-10-CM | POA: Diagnosis not present

## 2018-05-19 ENCOUNTER — Other Ambulatory Visit: Payer: Self-pay | Admitting: Family Medicine

## 2018-05-19 DIAGNOSIS — I259 Chronic ischemic heart disease, unspecified: Secondary | ICD-10-CM

## 2018-05-19 DIAGNOSIS — I10 Essential (primary) hypertension: Secondary | ICD-10-CM

## 2018-05-19 DIAGNOSIS — I214 Non-ST elevation (NSTEMI) myocardial infarction: Secondary | ICD-10-CM

## 2018-05-19 MED ORDER — CLOPIDOGREL BISULFATE 75 MG PO TABS: 75.0000 mg | ORAL_TABLET | Freq: Every day | ORAL | 1 refills | Status: DC

## 2018-05-19 MED ORDER — SPIRONOLACTONE 25 MG PO TABS: 25.0000 mg | ORAL_TABLET | Freq: Every day | ORAL | 1 refills | Status: DC

## 2018-05-19 MED ORDER — NEBIVOLOL HCL 5 MG PO TABS: 5.0000 mg | ORAL_TABLET | Freq: Every day | ORAL | 1 refills | Status: DC

## 2018-05-19 NOTE — Telephone Encounter (Signed)
Pt needs refills on spironolactone, clopidogrel and bystolic sent to [facility].  His call back [phone]

## 2018-06-09 DIAGNOSIS — I255 Ischemic cardiomyopathy: Secondary | ICD-10-CM | POA: Diagnosis not present

## 2018-06-09 DIAGNOSIS — I10 Essential (primary) hypertension: Secondary | ICD-10-CM | POA: Diagnosis not present

## 2018-06-09 DIAGNOSIS — I517 Cardiomegaly: Secondary | ICD-10-CM | POA: Diagnosis not present

## 2018-06-09 DIAGNOSIS — I259 Chronic ischemic heart disease, unspecified: Secondary | ICD-10-CM | POA: Diagnosis not present

## 2018-07-21 ENCOUNTER — Ambulatory Visit (INDEPENDENT_AMBULATORY_CARE_PROVIDER_SITE_OTHER): Payer: PPO

## 2018-07-21 DIAGNOSIS — Z23 Encounter for immunization: Secondary | ICD-10-CM | POA: Diagnosis not present

## 2018-07-21 NOTE — Patient Instructions (Signed)
Influenza (Flu) Vaccine (Inactivated or Recombinant): What You Need to Know  1. Why get vaccinated?  Influenza vaccine can prevent influenza (flu).  Flu is a contagious disease that spreads around the [place] every year, usually between October and May. Anyone can get the flu, but it is more dangerous for some people. Infants and young children, people 69 years of age and older, pregnant women, and people with certain health conditions or a weakened immune system are at greatest risk of flu complications.  Pneumonia, bronchitis, sinus infections and ear infections are examples of flu-related complications. If you have a medical condition, such as heart disease, cancer or diabetes, flu can make it worse.  Flu can cause fever and chills, sore throat, muscle aches, fatigue, cough, headache, and runny or stuffy nose. Some people may have vomiting and diarrhea, though this is more common in children than adults.  Each year thousands of people in the [place] die from flu, and many more are hospitalized. Flu vaccine prevents millions of illnesses and flu-related visits to the doctor each year.  2. Influenza vaccine  CDC recommends everyone 6 months of age and older get vaccinated every flu season. Children 6 months through 8 years of age may need 2 doses during a single flu season. Everyone else needs only 1 dose each flu season.  It takes about 2 weeks for protection to develop after vaccination.  There are many flu viruses, and they are always changing. Each year a new flu vaccine is made to protect against three or four viruses that are likely to cause disease in the upcoming flu season. Even when the vaccine doesn't exactly match these viruses, it may still provide some protection.  Influenza vaccine does not cause flu.  Influenza vaccine may be given at the same time as other vaccines.  3. Talk with your health care provider  Tell your vaccine provider if the person getting the vaccine:  · Has had an  allergic reaction after a previous dose of influenza vaccine, or has any severe, life-threatening allergies.  · Has ever had Guillain-Barré Syndrome (also called GBS).  In some cases, your health care provider may decide to postpone influenza vaccination to a future visit.  People with minor illnesses, such as a cold, may be vaccinated. People who are moderately or severely ill should usually wait until they recover before getting influenza vaccine.  Your health care provider can give you more information.  4. Risks of a vaccine reaction  · Soreness, redness, and swelling where shot is given, fever, muscle aches, and headache can happen after influenza vaccine.  · There may be a very small increased risk of Guillain-Barré Syndrome (GBS) after inactivated influenza vaccine (the flu shot).  Young children who get the flu shot along with pneumococcal vaccine (PCV13), and/or DTaP vaccine at the same time might be slightly more likely to have a seizure caused by fever. Tell your health care provider if a child who is getting flu vaccine has ever had a seizure.  People sometimes faint after medical procedures, including vaccination. Tell your provider if you feel dizzy or have vision changes or ringing in the ears.  As with any medicine, there is a very remote chance of a vaccine causing a severe allergic reaction, other serious injury, or death.  5. What if there is a serious problem?  An allergic reaction could occur after the vaccinated person leaves the clinic. If you see signs of a severe allergic reaction (hives, swelling   of the face and throat, difficulty breathing, a fast heartbeat, dizziness, or weakness), call 9-1-1 and get the person to the nearest hospital.  For other signs that concern you, call your health care provider.  Adverse reactions should be reported to the Vaccine Adverse Event Reporting System (VAERS). Your health care provider will usually file this report, or you can do it yourself. Visit the  VAERS website at www.vaers.hhs.gov or call 1-800-822-7967.VAERS is only for reporting reactions, and VAERS staff do not give medical advice.  6. The National Vaccine Injury Compensation Program  The National Vaccine Injury Compensation Program (VICP) is a federal program that was created to compensate people who may have been injured by certain vaccines. Visit the VICP website at www.hrsa.gov/vaccinecompensation or call 1-800-338-2382 to learn about the program and about filing a claim. There is a time limit to file a claim for compensation.  7. How can I learn more?  · Ask your healthcare provider.  · Call your local or state health department.  · Contact the Centers for Disease Control and Prevention (CDC):  ? Call 1-800-232-4636 (1-800-CDC-INFO) or  ? Visit CDC's www.cdc.gov/flu  Vaccine Information Statement (Interim) Inactivated Influenza Vaccine (02/20/2018)  This information is not intended to replace advice given to you by your health care provider. Make sure you discuss any questions you have with your health care provider.  Document Released: 04/19/2006 Document Revised: 02/24/2018 Document Reviewed: 02/24/2018  Elsevier Interactive Patient Education © 2019 Elsevier Inc.

## 2018-07-25 ENCOUNTER — Ambulatory Visit (INDEPENDENT_AMBULATORY_CARE_PROVIDER_SITE_OTHER): Payer: PPO | Admitting: Family Medicine

## 2018-07-25 ENCOUNTER — Encounter: Payer: Self-pay | Admitting: Family Medicine

## 2018-07-25 ENCOUNTER — Other Ambulatory Visit: Payer: Self-pay | Admitting: Family Medicine

## 2018-07-25 VITALS — BP 155/80 | HR 59 | Temp 98.0°F | Resp 16 | Ht 71.0 in | Wt 240.0 lb

## 2018-07-25 DIAGNOSIS — N401 Enlarged prostate with lower urinary tract symptoms: Secondary | ICD-10-CM

## 2018-07-25 DIAGNOSIS — I5020 Unspecified systolic (congestive) heart failure: Secondary | ICD-10-CM

## 2018-07-25 DIAGNOSIS — I255 Ischemic cardiomyopathy: Secondary | ICD-10-CM

## 2018-07-25 DIAGNOSIS — E782 Mixed hyperlipidemia: Secondary | ICD-10-CM | POA: Diagnosis not present

## 2018-07-25 DIAGNOSIS — E66811 Obesity, class 1: Secondary | ICD-10-CM

## 2018-07-25 DIAGNOSIS — E669 Obesity, unspecified: Secondary | ICD-10-CM | POA: Insufficient documentation

## 2018-07-25 DIAGNOSIS — R7303 Prediabetes: Secondary | ICD-10-CM

## 2018-07-25 DIAGNOSIS — I10 Essential (primary) hypertension: Secondary | ICD-10-CM | POA: Diagnosis not present

## 2018-07-25 DIAGNOSIS — N138 Other obstructive and reflux uropathy: Secondary | ICD-10-CM

## 2018-07-25 DIAGNOSIS — I4729 Other ventricular tachycardia: Secondary | ICD-10-CM

## 2018-07-25 DIAGNOSIS — Z0000 Encounter for general adult medical examination without abnormal findings: Secondary | ICD-10-CM

## 2018-07-25 DIAGNOSIS — E559 Vitamin D deficiency, unspecified: Secondary | ICD-10-CM

## 2018-07-25 DIAGNOSIS — I472 Ventricular tachycardia: Secondary | ICD-10-CM | POA: Insufficient documentation

## 2018-07-25 DIAGNOSIS — I2510 Atherosclerotic heart disease of native coronary artery without angina pectoris: Secondary | ICD-10-CM

## 2018-07-25 NOTE — Assessment & Plan Note (Signed)
Previously elevated TG, unable to calc LDL Goal LDL < 100, not achieved goal for CAD s/p MI stent Failed: Rosuvastatin 2.5, 5mg , Simvastatin 20mg , Zetia 10mg  - failed d/t myalgia  Plan: 1. Discussed options again to lower lipid goal < 100 and reduce ASCVD risk, already on DAPT - Again - discussed recommend PCSK9 - he will re-check again now to see if copay is more affordable - CONTINUE Rx Vascepa 1g caps x 2 BID - Consider last resort with once weekly low dose statin - Follow-up 2-3 months for fasting labs re-check lipids

## 2018-07-25 NOTE — Progress Notes (Signed)
Subjective:    Patient ID: Jose Dunlap, male    DOB: 1951/06/26, 68 y.o.   MRN: [id number]  Jose Dunlap is a 68 y.o. 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) / S/p Pacemaker/AICD OBESITY BMI >33 - Followed by Dr Billie Ruddy Cardiologyq 6 months (last visit 06/2018) and for pacemakertestq 3 months - Interval update - Jose Dunlap has not contacted Dr Ubaldo Glassing or insurance again about Repatha or new PCSK9 meds, since failing statin in past stopped due to myalgia - Continues to take Vascepa 1g x2 BID - Jose Dunlap has rarely taken Furosemide for swelling - Lifestyle now Jose Dunlap has taken part-time job, to stay more active in retirement - Jose Dunlap feels much better now working 3 x a week, Jose Dunlap feels better with more breathe and more energy  - Prior failed cholesterol treatment: Simvastatin 20mg , Rosuvastatin 5, 2.5mg , Zetia 10mg  - Significant history of CAD s/p MI with stent placement RCA, has ICD, taking ASA 81 and Plavix 75mg  daily for DAPT  CHRONIC HTN: Reports home BP readings have been mostly normal. Current Meds - Lisinopril 30mg , Nebivolol 5mg , Spironolactone 25mg  daily  - NIGHTLY DOSING Reports good compliance, took meds today. Tolerating well, w/o complaints. Denies CP, dyspnea, HA, edema, dizziness / lightheadedness  BPH s/p TURP Previously followed by Dr Jacqlyn Larsen [facility]) now last seen 2017 has not returned. Doing well s/p surgery, voiding well without concern.   Health Maintenance: UTD Flu Vaccine 07/21/18  Depression screen [facility] 2/9 07/25/2018 08/13/2017 02/06/2017  Decreased Interest 0 0 0  Down, Depressed, Hopeless 0 0 0  PHQ - 2 Score 0 0 0    Social History   Tobacco Use  . Smoking status: Former Research scientist (life sciences)  . Smokeless tobacco: Former Network engineer Use Topics  . Alcohol use: Yes  . Drug use: No    Review of Systems Per HPI unless specifically indicated above     Objective:    BP (!) 155/80 (BP  Location: Left Arm, Cuff Size: Normal)   Pulse (!) 59   Temp 98 F (36.7 C) (Oral)   Resp 16   Ht 5\' 11"  (1.803 m)   Wt 240 lb (108.9 kg)   BMI 33.47 kg/m   Wt Readings from Last 3 Encounters:  07/25/18 240 lb (108.9 kg)  10/28/17 242 lb (109.8 kg)  10/24/17 243 lb 11.2 oz (110.5 kg)    Physical Exam Vitals signs and nursing note reviewed.  Constitutional:      General: Jose Dunlap is not in acute distress.    Appearance: Jose Dunlap is well-developed. Jose Dunlap is not diaphoretic.     Comments: Well-appearing, comfortable, cooperative  HENT:     Head: Normocephalic and atraumatic.  Eyes:     General:        Right eye: No discharge.        Left eye: No discharge.     Conjunctiva/sclera: Conjunctivae normal.  Neck:     Musculoskeletal: Normal range of motion and neck supple.     Thyroid: No thyromegaly.  Cardiovascular:     Rate and Rhythm: Normal rate and regular rhythm.     Heart sounds: Normal heart sounds. No murmur.  Pulmonary:     Effort: Pulmonary effort is normal. No respiratory distress.     Breath sounds: Normal breath sounds. No wheezing or rales.  Musculoskeletal: Normal range of motion.  Lymphadenopathy:     Cervical: No cervical  adenopathy.  Skin:    General: Skin is warm and dry.     Findings: No erythema or rash.  Neurological:     Mental Status: Jose Dunlap is alert and oriented to person, place, and time.  Psychiatric:        Behavior: Behavior normal.     Comments: Well groomed, good eye contact, normal speech and thoughts    Results for orders placed or performed during the hospital encounter of 10/23/17  NM Myocar Multi W/Spect W/Wall Motion / EF  Result Value Ref Range   Rest HR 60 bpm   Rest BP 122/74 mmHg   Exercise duration (sec) 0 sec   Percent HR 62 %   Exercise duration (min) 1 min   Estimated workload 1.0 METS   Peak HR 95 bpm   Peak BP 118/77 mmHg   MPHR 152 bpm   SSS 17    SRS 19    SDS 3    TID 1.11    LV sys vol 116 mL   LV dias vol 210 62 - 150 mL    Basic metabolic panel  Result Value Ref Range   Sodium 137 135 - 145 mmol/L   Potassium 4.5 3.5 - 5.1 mmol/L   Chloride 102 101 - 111 mmol/L   CO2 28 22 - 32 mmol/L   Glucose, Bld 110 (H) 65 - 99 mg/dL   BUN 13 6 - 20 mg/dL   Creatinine, Ser 0.91 0.61 - 1.24 mg/dL   Calcium 9.1 8.9 - 10.3 mg/dL   GFR calc non Af Amer >60 >60 mL/min   GFR calc Af Amer >60 >60 mL/min   Anion gap 7 5 - 15  CBC  Result Value Ref Range   WBC 7.7 3.8 - 10.6 K/uL   RBC 5.36 4.40 - 5.90 MIL/uL   Hemoglobin 16.4 13.0 - 18.0 g/dL   HCT 47.0 40.0 - 52.0 %   MCV 87.7 80.0 - 100.0 fL   MCH 30.5 26.0 - 34.0 pg   MCHC 34.8 32.0 - 36.0 g/dL   RDW 14.9 (H) 11.5 - 14.5 %   Platelets 168 150 - 440 K/uL  Troponin I  Result Value Ref Range   Troponin I <0.03 <0.03 ng/mL  Fibrin derivatives D-Dimer  Result Value Ref Range   Fibrin derivatives D-dimer ([facility]) 371.13 0.00 - 499.00 ng/mL (FEU)  Magnesium  Result Value Ref Range   Magnesium 2.0 1.7 - 2.4 mg/dL  Troponin I  Result Value Ref Range   Troponin I <0.03 <0.03 ng/mL  Troponin I  Result Value Ref Range   Troponin I <0.03 <0.03 ng/mL      Assessment & Plan:   Problem List Items Addressed This Visit    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 Continue current med regimen Lisinopril 30mg  daily, Bystolic (Nebivolol) 5mg  daily, Spironolactone 25mg  daily - Rarely taking furosemide 2. Encourage improve regular exercise, continue low sodium diet, reduce  portion size for wt  loss 3. Monitor BP at home, if elevated >140/90 persistently elevated, notify office sooner 4. Continue follow-up with Cardiology 5. Follow-up 6 months      Hyperlipidemia    Previously elevated TG, unable to calc LDL Goal LDL < 100, not achieved goal for CAD s/p MI stent Failed: Rosuvastatin 2.5, 5mg , Simvastatin 20mg , Zetia 10mg  - failed d/t myalgia  Plan: 1. Discussed options again to lower lipid goal < 100 and reduce ASCVD risk, already on DAPT - Again - discussed recommend PCSK9 - Jose Dunlap will re-check again now to see if copay is more affordable - CONTINUE Rx Vascepa 1g caps x 2 BID - Consider last resort with once weekly low dose statin - Follow-up 2-3 months for fasting labs re-check lipids      NSVT (nonsustained ventricular tachycardia) ([place])    Followed by Cardiology No further arrhythmia Has pacemaker check q 3 months      Obesity (BMI 30.0-34.9)    Weight improved down 4-5 lbs Continue weight loss, diet, exercise regimen - more active now work part time         No orders of the defined types were placed in this encounter.    Follow up plan: Return in about 3 months (around 10/24/2018) for Annual Physical.  Future labs ordered for 10/28/18  Nobie Putnam, [facility] 07/25/2018, 8:37 AM

## 2018-07-25 NOTE — Assessment & Plan Note (Signed)
Followed by Cardiology No further arrhythmia Has pacemaker check q 3 months

## 2018-07-25 NOTE — Patient Instructions (Addendum)
Thank you for coming to the office today.  Ask [employer] about - Repatha - see what the cost and coverage is. We can try this next time if affordable.  Keep track of BP at home, if >150/90 consistently notify me or Dr Ubaldo Glassing  DUE for [place] (no food or drink after midnight before the lab appointment, only water or coffee without cream/sugar on the morning of)  SCHEDULE "Lab Only" visit in the morning at the clinic for lab draw in 2-3 MONTHS   - Make sure Lab Only appointment is at about 1 week before your next appointment, so that results will be available  For Lab Results, once available within 2-3 days of blood draw, you can can log in to MyChart online to view your results and a brief explanation. Also, we can discuss results at next follow-up visit.   Please schedule a Follow-up Appointment to: Return in about 3 months (around 10/24/2018) for Annual Physical.  If you have any other questions or concerns, please feel free to call the office or send a message through [place]. You may also schedule an earlier appointment if necessary.  Additionally, you may be receiving a survey about your experience at our office within a few days to 1 week by e-mail or mail. We value your feedback.  Nobie Putnam, DO [place]

## 2018-07-25 NOTE — Assessment & Plan Note (Signed)
Again elevated, seems possible white coat hypertension, home readings normal - improve on re-check Complication with known CAD s/p MI Failed: Carvedilol (intolerance)  Plan: 1. Continue current med regimen Lisinopril 30mg  daily, Bystolic (Nebivolol) 5mg  daily, Spironolactone 25mg  daily - Rarely taking furosemide 2. Encourage improve regular exercise, continue low sodium diet, reduce portion size for wt loss 3. Monitor BP at home, if elevated >140/90 persistently elevated, notify office sooner 4. Continue follow-up with Cardiology 5. Follow-up 6 months

## 2018-07-25 NOTE — Assessment & Plan Note (Signed)
Weight improved down 4-5 lbs Continue weight loss, diet, exercise regimen - more active now work part time

## 2018-07-25 NOTE — Assessment & Plan Note (Signed)
Followed by Dr Ubaldo Glassing [facility] Cardiology) Secondary to history of NSTEMI, s/p PCI Last ECHO EF 30-45% Continue on current med management

## 2018-07-25 NOTE — Assessment & Plan Note (Addendum)
Stable controlled s/p TURP No longer w/ Urology

## 2018-08-14 ENCOUNTER — Other Ambulatory Visit: Payer: Self-pay | Admitting: Family Medicine

## 2018-08-14 DIAGNOSIS — I214 Non-ST elevation (NSTEMI) myocardial infarction: Secondary | ICD-10-CM

## 2018-08-14 DIAGNOSIS — I10 Essential (primary) hypertension: Secondary | ICD-10-CM

## 2018-08-14 DIAGNOSIS — I259 Chronic ischemic heart disease, unspecified: Secondary | ICD-10-CM

## 2018-08-14 MED ORDER — SPIRONOLACTONE 25 MG PO TABS: 25.0000 mg | ORAL_TABLET | Freq: Every day | ORAL | 1 refills | Status: DC

## 2018-08-14 MED ORDER — NEBIVOLOL HCL 5 MG PO TABS: 5.0000 mg | ORAL_TABLET | Freq: Every day | ORAL | 1 refills | Status: DC

## 2018-08-14 MED ORDER — CLOPIDOGREL BISULFATE 75 MG PO TABS: 75.0000 mg | ORAL_TABLET | Freq: Every day | ORAL | 1 refills | Status: DC

## 2018-09-24 ENCOUNTER — Telehealth: Payer: PPO | Admitting: Family

## 2018-09-24 ENCOUNTER — Telehealth: Payer: Self-pay

## 2018-09-24 ENCOUNTER — Encounter: Payer: Self-pay | Admitting: Family Medicine

## 2018-09-24 DIAGNOSIS — J069 Acute upper respiratory infection, unspecified: Secondary | ICD-10-CM | POA: Diagnosis not present

## 2018-09-24 MED ORDER — BENZONATATE 100 MG PO CAPS: 100.0000 mg | ORAL_CAPSULE | Freq: Three times a day (TID) | ORAL | 0 refills | Status: DC | PRN

## 2018-09-24 MED ORDER — FLUTICASONE PROPIONATE 50 MCG/ACT NA SUSP: 2.0000 | Freq: Every day | NASAL | 6 refills | Status: AC

## 2018-09-24 NOTE — Progress Notes (Signed)
We are sorry you are not feeling well.  Here is how we plan to help!  Based on what you have shared with me, it looks like you may have a viral upper respiratory infection.  Upper respiratory infections are caused by a large number of viruses; however, rhinovirus is the most common cause.   Approximately 5 minutes was spent documenting and reviewing patient's chart.   Symptoms vary from person to person, with common symptoms including sore throat, cough, and fatigue or lack of energy.  A low-grade fever of up to 100.4 may present, but is often uncommon.  Symptoms vary however, and are closely related to a person's age or underlying illnesses.  The most common symptoms associated with an upper respiratory infection are nasal discharge or congestion, cough, sneezing, headache and pressure in the ears and face.  These symptoms usually persist for about 3 to 10 days, but can last up to 2 weeks.  It is important to know that upper respiratory infections do not cause serious illness or complications in most cases.    Upper respiratory infections can be transmitted from person to person, with the most common method of transmission being a person's hands.  The virus is able to live on the skin and can infect other persons for up to 2 hours after direct contact.  Also, these can be transmitted when someone coughs or sneezes; thus, it is important to cover the mouth to reduce this risk.  To keep the spread of the illness at bay, good hand hygiene is very important.  This is an infection that is most likely caused by a virus. There are no specific treatments other than to help you with the symptoms until the infection runs its course.  We are sorry you are not feeling well.  Here is how we plan to help!   For nasal congestion, you may use an oral decongestants such as Mucinex D or if you have glaucoma or high blood pressure use plain Mucinex.  Saline nasal spray or nasal drops can help and can safely be used as  often as needed for congestion.  For your congestion, I have prescribed Fluticasone nasal spray one spray in each nostril twice a day  If you do not have a history of heart disease, hypertension, diabetes or thyroid disease, prostate/bladder issues or glaucoma, you may also use Sudafed to treat nasal congestion.  It is highly recommended that you consult with a pharmacist or your primary care physician to ensure this medication is safe for you to take.     If you have a cough, you may use cough suppressants such as Delsym and Robitussin.  If you have glaucoma or high blood pressure, you can also use Coricidin HBP.   For cough I have prescribed for you A prescription cough medication called Tessalon Perles 100 mg. You may take 1-2 capsules every 8 hours as needed for cough.  If you have a sore or scratchy throat, use a saltwater gargle-  to  teaspoon of salt dissolved in a 4-ounce to 8-ounce glass of warm water.  Gargle the solution for approximately 15-30 seconds and then spit.  It is important not to swallow the solution.  You can also use throat lozenges/cough drops and Chloraseptic spray to help with throat pain or discomfort.  Warm or cold liquids can also be helpful in relieving throat pain.  For headache, pain or general discomfort, you can use Ibuprofen or Tylenol as directed.   Some authorities   believe that zinc sprays or the use of Echinacea may shorten the course of your symptoms.   HOME CARE . Only take medications as instructed by your medical team. . Be sure to drink plenty of fluids. Water is fine as well as fruit juices, sodas and electrolyte beverages. You may want to stay away from caffeine or alcohol. If you are nauseated, try taking small sips of liquids. How do you know if you are getting enough fluid? Your urine should be a pale yellow or almost colorless. . Get rest. . Taking a steamy shower or using a humidifier may help nasal congestion and ease sore throat pain. You can  place a towel over your head and breathe in the steam from hot water coming from a faucet. . Using a saline nasal spray works much the same way. . Cough drops, hard candies and sore throat lozenges may ease your cough. . Avoid close contacts especially the very young and the elderly . Cover your mouth if you cough or sneeze . Always remember to wash your hands.   GET HELP RIGHT AWAY IF: . You develop worsening fever. . If your symptoms do not improve within 10 days . You develop yellow or green discharge from your nose over 3 days. . You have coughing fits . You develop a severe head ache or visual changes. . You develop shortness of breath, difficulty breathing or start having chest pain . Your symptoms persist after you have completed your treatment plan  MAKE SURE YOU   Understand these instructions.  Will watch your condition.  Will get help right away if you are not doing well or get worse.  Your e-visit answers were reviewed by a board certified advanced clinical practitioner to complete your personal care plan. Depending upon the condition, your plan could have included both over the counter or prescription medications. Please review your pharmacy choice. If there is a problem, you may call our nursing hot line at and have the prescription routed to another pharmacy. Your safety is important to us. If you have drug allergies check your prescription carefully.   You can use MyChart to ask questions about today's visit, request a non-urgent call back, or ask for a work or school excuse for 24 hours related to this e-Visit. If it has been greater than 24 hours you will need to follow up with your provider, or enter a new e-Visit to address those concerns. You will get an e-mail in the next two days asking about your experience.  I hope that your e-visit has been valuable and will speed your recovery. Thank you for using e-visits.      

## 2018-09-24 NOTE — Telephone Encounter (Signed)
Called patient, he did the E visit, they treated him with sinus medicine, if not better he was aware to reach back out to them, he said did not get antibiotic, and I re-viewed their recommendations again, he understands and will start this treatment first.  I will write him a letter accessible in [place] for his work note.  Jose Dunlap, [facility] 09/24/2018, 1:05 PM

## 2018-09-24 NOTE — Telephone Encounter (Signed)
Patient was mowing lawn and his allergies got worst now has cough progressively getting worst with greenish mucus, Headache, sinus infection but has SOB. Patient work part time so want work note for week till next Wednesday. Patient advised to do E-visit.

## 2018-09-29 ENCOUNTER — Other Ambulatory Visit: Payer: Self-pay

## 2018-09-29 DIAGNOSIS — I2510 Atherosclerotic heart disease of native coronary artery without angina pectoris: Secondary | ICD-10-CM

## 2018-09-29 MED ORDER — NITROGLYCERIN 0.4 MG SL SUBL: 0.4000 mg | SUBLINGUAL_TABLET | SUBLINGUAL | 4 refills | Status: DC | PRN

## 2018-09-29 NOTE — Telephone Encounter (Signed)
Sent to [employer] mail  Nobie Putnam, [facility] 09/29/2018, 4:58 PM

## 2018-10-28 ENCOUNTER — Other Ambulatory Visit: Payer: PPO

## 2018-11-04 ENCOUNTER — Encounter: Payer: PPO | Admitting: Family Medicine

## 2018-12-08 DIAGNOSIS — C229 Malignant neoplasm of liver, not specified as primary or secondary: Secondary | ICD-10-CM

## 2018-12-08 HISTORY — DX: Malignant neoplasm of liver, not specified as primary or secondary: C22.9

## 2018-12-30 ENCOUNTER — Other Ambulatory Visit: Payer: Self-pay | Admitting: Family Medicine

## 2018-12-30 ENCOUNTER — Encounter: Payer: Self-pay | Admitting: Family Medicine

## 2018-12-30 ENCOUNTER — Other Ambulatory Visit: Payer: Self-pay

## 2018-12-30 ENCOUNTER — Ambulatory Visit (INDEPENDENT_AMBULATORY_CARE_PROVIDER_SITE_OTHER): Payer: PPO | Admitting: Family Medicine

## 2018-12-30 ENCOUNTER — Telehealth: Payer: Self-pay | Admitting: Hematology

## 2018-12-30 ENCOUNTER — Telehealth: Payer: Self-pay | Admitting: *Deleted

## 2018-12-30 ENCOUNTER — Other Ambulatory Visit: Payer: Medicare (Managed Care)

## 2018-12-30 DIAGNOSIS — J9801 Acute bronchospasm: Secondary | ICD-10-CM | POA: Diagnosis not present

## 2018-12-30 DIAGNOSIS — J40 Bronchitis, not specified as acute or chronic: Secondary | ICD-10-CM | POA: Diagnosis not present

## 2018-12-30 DIAGNOSIS — Z20822 Contact with and (suspected) exposure to covid-19: Secondary | ICD-10-CM

## 2018-12-30 DIAGNOSIS — I10 Essential (primary) hypertension: Secondary | ICD-10-CM

## 2018-12-30 DIAGNOSIS — I2510 Atherosclerotic heart disease of native coronary artery without angina pectoris: Secondary | ICD-10-CM

## 2018-12-30 DIAGNOSIS — I259 Chronic ischemic heart disease, unspecified: Secondary | ICD-10-CM

## 2018-12-30 DIAGNOSIS — I214 Non-ST elevation (NSTEMI) myocardial infarction: Secondary | ICD-10-CM

## 2018-12-30 MED ORDER — PREDNISONE 50 MG PO TABS: 50.0000 mg | ORAL_TABLET | Freq: Every day | ORAL | 0 refills | Status: DC

## 2018-12-30 MED ORDER — NITROGLYCERIN 0.4 MG SL SUBL: 0.4000 mg | SUBLINGUAL_TABLET | SUBLINGUAL | 4 refills | Status: AC | PRN

## 2018-12-30 MED ORDER — CLOPIDOGREL BISULFATE 75 MG PO TABS: 75.0000 mg | ORAL_TABLET | Freq: Every day | ORAL | 1 refills | Status: AC

## 2018-12-30 MED ORDER — AZITHROMYCIN 250 MG PO TABS: ORAL_TABLET | ORAL | 0 refills | Status: DC

## 2018-12-30 MED ORDER — SPIRONOLACTONE 25 MG PO TABS: 25.0000 mg | ORAL_TABLET | Freq: Every day | ORAL | 1 refills | Status: DC

## 2018-12-30 MED ORDER — ALBUTEROL SULFATE HFA 108 (90 BASE) MCG/ACT IN AERS: 2.0000 | INHALATION_SPRAY | RESPIRATORY_TRACT | 0 refills | Status: AC | PRN

## 2018-12-30 MED ORDER — NEBIVOLOL HCL 5 MG PO TABS: 5.0000 mg | ORAL_TABLET | Freq: Every day | ORAL | 1 refills | Status: AC

## 2018-12-30 NOTE — Telephone Encounter (Signed)
Pt called requesting refill on  Bystolic,clopidogrel 75 mg, spironolactone, nitroglycerin  Call into  His mail order. Pt call back # is  [phone]

## 2018-12-30 NOTE — Telephone Encounter (Signed)
Pt returned call for scheduling his test. Appointment scheduled and order placed.

## 2018-12-30 NOTE — Telephone Encounter (Signed)
Open in error  This encounter was created in error - please disregard.

## 2018-12-30 NOTE — Progress Notes (Signed)
Virtual Visit via Telephone The purpose of this virtual visit is to provide medical care while limiting exposure to the novel coronavirus (COVID19) for both patient and office staff.  Consent was obtained for phone visit:  Yes.   Answered questions that patient had about telehealth interaction:  Yes.   I discussed the limitations, risks, security and privacy concerns of performing an evaluation and management service by telephone. I also discussed with the patient that there may be a patient responsible charge related to this service. The patient expressed understanding and agreed to proceed.  Patient Location: Home Provider Location: [facility])   ---------------------------------------------------------------------- Chief Complaint  Patient presents with  . dry cough    onset 2 month flonase didn't improve and coughing is hard to a point that he can'r breath no nasal draiage, chest tighntess    S: Reviewed CMA documentation. I have called patient and gathered additional HPI as follows:  PERSISTENT COUGH / POSSIBLE COVID19 Reports that symptoms started 2 months ago with dry hacking cough without improve, has not had something that lasted this long, seems persistent. - Tried OTC Flonase, Afrin, Benadryl, OTC cough medicine, robitussin, Mucinex  Patient mostly in isolation Denies any high risk travel to areas of current concern for COVID19. Denies any known or suspected exposure to person with or possibly with COVID19.  Admits occasional chills at night, shortness of breath only with persistent coughing spell, but not at rest Denies any fevers, sweats, body ache, shortness of breath, sinus pain or pressure, headache, abdominal pain, diarrhea  -------------------------------------------------------------------------- O: No physical exam performed due to remote telephone  encounter.  -------------------------------------------------------------------------- A&P:   Suspected Bronchitis / Cough vs POSSIBLE COVID19 cannot rule out - Cough seems to be more like bronchospasm, uncertain trigger, no known asthma or copd - Reassuring without high risk symptoms - Afebrile but does endorse some chills now, without dyspnea actively only with persistent cough - No comorbid pulmonary conditions (asthma, COPD) or immunocompromise  Ordered COVID19 testing through [facility] testing pool - they will contact patient via mychart / phone to proceed with arranging date/time testing. Symptomatic medications as recommended for symptoms, prefer Tylenol instead of NSAID, decongestant, mucinex, hydration - continue OTC meds if helpful Additionally will cover empiric bronchitis w/ bronchospasm Start Azithromycin Z pak (antibiotic) 2 tabs day 1, then 1 tab x 4 days, complete entire course even if improved Start Prednisone 50mg  daily for 5 days Start Albuterol inhaler PRN Note for work - written, on Pharmacist, community, with return to work strategy if negative test and resolving symptoms fever free >3 days - anticipated return by Tuesday 6/30 if negative. If POSITIVE then would need to be out additional 10 days AFTER positive test.  Meds ordered this encounter  Medications  . azithromycin (ZITHROMAX Z-PAK) 250 MG tablet    Sig: Take 2 tabs (500mg  total) on Day 1. Take 1 tab (250mg ) daily for next 4 days.    Dispense:  6 tablet    Refill:  0  . predniSONE (DELTASONE) 50 MG tablet    Sig: Take 1 tablet (50 mg total) by mouth daily with breakfast.    Dispense:  5 tablet    Refill:  0  . albuterol (VENTOLIN HFA) 108 (90 Base) MCG/ACT inhaler    Sig: Inhale 2 puffs into the lungs every 4 (four) hours as needed for wheezing or shortness of breath (cough).    Dispense:  8 g    Refill:  0  REQUIRED self quarantine to [place] - advised to avoid all exposure with others while  during treatment / TESTING - pending result - Should continue to quarantine for up to 7-10 days, pending resolution of symptoms, if symptoms resolve by 7 days and is afebrile >3 days - may STOP self quarantine at that time.  If test positive will need to be out 10 days after test.  If symptoms do not resolve or significantly improve OR if WORSENING - fever / cough - or worsening shortness of breath - then should contact us and seek advice on next steps in treatment at home vs where/when to seek care at Urgent Care or Hospital ED for further intervention and possible testing if indicated.  Patient verbalizes understanding with the above medical recommendations including the limitation of remote medical advice.  Specific follow-up / call-back criteria were given for patient to follow-up or seek medical care more urgently if needed.   - Time spent in direct consultation with patient on phone: 15 minutes   Nobie Putnam, [facility] 12/30/2018, 10:02 AM

## 2018-12-30 NOTE — Telephone Encounter (Signed)
Parks Ranger, Jose Doughty, DO  [facility] [place] [facility] [place]   Pronouns: patient's name  68 y.o., 1950/08/28, Male  MRN: [id number]   [phone] (Mobile)   Patient evaluated today 12/30/18 virtual telephone visit, persistent cough and some short of breath for 2 months, now with some episodes of chills, no known exposure, requesting testing for [place].   MyChart active.   Please let me know if you need more info.   Nobie Putnam, [facility]  12/30/2018, 10:16 AM

## 2018-12-30 NOTE — Addendum Note (Signed)
Addended by: Fransisca Connors A on: 12/30/2018 11:49 AM   Modules accepted: Orders

## 2018-12-30 NOTE — Telephone Encounter (Signed)
Left message for pt to return call to schedule COVID-19 testing.

## 2018-12-30 NOTE — Telephone Encounter (Signed)
See TE from 12-30-2018.  Patient already notified and scheduled.

## 2018-12-30 NOTE — Patient Instructions (Signed)
1. It sounds like you had an Upper Respiratory Virus that has settled into a Bronchitis, lower respiratory tract infection. I don't have concerns for pneumonia today, and think that this should gradually improve. Once you are feeling better, the cough may take a few weeks to fully resolve. May be wheezing   Start Azithromycin Z pak (antibiotic) 2 tabs day 1, then 1 tab x 4 days, complete entire course even if improved  Start Prednisone 50mg  daily for next 5 days - this will open up lungs allow you to breath better and treat that wheezing or bronchospasm - Use Albuterol inhaler 2 puffs every 4-6 hours around the clock for next 2-3 days, max up to 5 days then use as needed (ask pharmacist to demonstrate proper inhaler use)  - Use nasal saline (Simply Saline or Ocean Spray) to flush nasal congestion multiple times a day, may help cough - Drink plenty of fluids to improve congestion  If your symptoms seem to worsen instead of improve over next several days, including significant fever / chills, worsening shortness of breath, worsening wheezing, or nausea / vomiting and can't take medicines - return sooner or go to hospital Emergency Department for more immediate treatment.  TEST for COVID19 - they will contact you today, stay tuned.  Note out for work for 7 days, return next Tuesday if negative test and improved, otherwise, if POSITIVE we re-write the note for 10 days after positive test.   Please schedule a Follow-up Appointment to: Return in about 1 week (around 01/06/2019), or if symptoms worsen or fail to improve, for cough.  If you have any other questions or concerns, please feel free to call the office or send a message through [facility]. You may also schedule an earlier appointment if necessary.  Additionally, you may be receiving a survey about your experience at our office within a few days to 1 week by e-mail or mail. We value your feedback.  Nobie Putnam, DO [place]

## 2019-01-02 LAB — NOVEL CORONAVIRUS, NAA: SARS-CoV-2, NAA: NOT DETECTED

## 2019-01-12 ENCOUNTER — Emergency Department: Payer: PPO

## 2019-01-12 ENCOUNTER — Inpatient Hospital Stay
Admission: EM | Admit: 2019-01-12 | Discharge: 2019-01-14 | DRG: 641 | Disposition: A | Discharge status: Home / Self Care | Payer: PPO | Attending: Internal Medicine | Admitting: Internal Medicine

## 2019-01-12 ENCOUNTER — Other Ambulatory Visit: Payer: Self-pay

## 2019-01-12 ENCOUNTER — Encounter: Payer: Self-pay | Admitting: Emergency Medicine

## 2019-01-12 DIAGNOSIS — R748 Abnormal levels of other serum enzymes: Secondary | ICD-10-CM

## 2019-01-12 DIAGNOSIS — Z950 Presence of cardiac pacemaker: Secondary | ICD-10-CM

## 2019-01-12 DIAGNOSIS — I252 Old myocardial infarction: Secondary | ICD-10-CM

## 2019-01-12 DIAGNOSIS — Z87891 Personal history of nicotine dependence: Secondary | ICD-10-CM

## 2019-01-12 DIAGNOSIS — Z6830 Body mass index (BMI) 30.0-30.9, adult: Secondary | ICD-10-CM

## 2019-01-12 DIAGNOSIS — I5022 Chronic systolic (congestive) heart failure: Secondary | ICD-10-CM | POA: Diagnosis not present

## 2019-01-12 DIAGNOSIS — R7989 Other specified abnormal findings of blood chemistry: Secondary | ICD-10-CM

## 2019-01-12 DIAGNOSIS — Z7902 Long term (current) use of antithrombotics/antiplatelets: Secondary | ICD-10-CM

## 2019-01-12 DIAGNOSIS — R590 Localized enlarged lymph nodes: Secondary | ICD-10-CM | POA: Diagnosis present

## 2019-01-12 DIAGNOSIS — I255 Ischemic cardiomyopathy: Secondary | ICD-10-CM | POA: Diagnosis not present

## 2019-01-12 DIAGNOSIS — C801 Malignant (primary) neoplasm, unspecified: Secondary | ICD-10-CM | POA: Diagnosis not present

## 2019-01-12 DIAGNOSIS — Z955 Presence of coronary angioplasty implant and graft: Secondary | ICD-10-CM | POA: Diagnosis not present

## 2019-01-12 DIAGNOSIS — R05 Cough: Secondary | ICD-10-CM | POA: Diagnosis not present

## 2019-01-12 DIAGNOSIS — I959 Hypotension, unspecified: Secondary | ICD-10-CM | POA: Diagnosis not present

## 2019-01-12 DIAGNOSIS — R531 Weakness: Secondary | ICD-10-CM

## 2019-01-12 DIAGNOSIS — C787 Secondary malignant neoplasm of liver and intrahepatic bile duct: Secondary | ICD-10-CM | POA: Diagnosis present

## 2019-01-12 DIAGNOSIS — I700 Atherosclerosis of aorta: Secondary | ICD-10-CM | POA: Diagnosis not present

## 2019-01-12 DIAGNOSIS — Z7982 Long term (current) use of aspirin: Secondary | ICD-10-CM

## 2019-01-12 DIAGNOSIS — E785 Hyperlipidemia, unspecified: Secondary | ICD-10-CM | POA: Diagnosis not present

## 2019-01-12 DIAGNOSIS — I2510 Atherosclerotic heart disease of native coronary artery without angina pectoris: Secondary | ICD-10-CM | POA: Diagnosis not present

## 2019-01-12 DIAGNOSIS — Z7951 Long term (current) use of inhaled steroids: Secondary | ICD-10-CM

## 2019-01-12 DIAGNOSIS — R945 Abnormal results of liver function studies: Secondary | ICD-10-CM | POA: Diagnosis not present

## 2019-01-12 DIAGNOSIS — E669 Obesity, unspecified: Secondary | ICD-10-CM | POA: Diagnosis not present

## 2019-01-12 DIAGNOSIS — E860 Dehydration: Secondary | ICD-10-CM | POA: Diagnosis not present

## 2019-01-12 DIAGNOSIS — C229 Malignant neoplasm of liver, not specified as primary or secondary: Secondary | ICD-10-CM | POA: Diagnosis not present

## 2019-01-12 DIAGNOSIS — J209 Acute bronchitis, unspecified: Secondary | ICD-10-CM | POA: Diagnosis present

## 2019-01-12 DIAGNOSIS — Z79899 Other long term (current) drug therapy: Secondary | ICD-10-CM | POA: Diagnosis not present

## 2019-01-12 DIAGNOSIS — K635 Polyp of colon: Secondary | ICD-10-CM | POA: Diagnosis not present

## 2019-01-12 DIAGNOSIS — Z20828 Contact with and (suspected) exposure to other viral communicable diseases: Secondary | ICD-10-CM | POA: Diagnosis not present

## 2019-01-12 DIAGNOSIS — K769 Liver disease, unspecified: Secondary | ICD-10-CM | POA: Diagnosis not present

## 2019-01-12 DIAGNOSIS — I110 Hypertensive heart disease with heart failure: Secondary | ICD-10-CM | POA: Diagnosis present

## 2019-01-12 DIAGNOSIS — D72829 Elevated white blood cell count, unspecified: Secondary | ICD-10-CM | POA: Diagnosis not present

## 2019-01-12 LAB — COMPREHENSIVE METABOLIC PANEL
ALT: 75 U/L — ABNORMAL HIGH (ref 0–44)
ALT: 69 U/L — ABNORMAL HIGH (ref 0–44)
AST: 53 U/L — ABNORMAL HIGH (ref 15–41)
AST: 45 U/L — ABNORMAL HIGH (ref 15–41)
Albumin: 2.9 g/dL — ABNORMAL LOW (ref 3.5–5.0)
Albumin: 3.1 g/dL — ABNORMAL LOW (ref 3.5–5.0)
Alkaline Phosphatase: 532 U/L — ABNORMAL HIGH (ref 38–126)
Alkaline Phosphatase: 524 U/L — ABNORMAL HIGH (ref 38–126)
Anion gap: 11 (ref 5–15)
Anion gap: 12 (ref 5–15)
BUN: 16 mg/dL (ref 8–23)
BUN: 14 mg/dL (ref 8–23)
CO2: 25 mmol/L (ref 22–32)
CO2: 24 mmol/L (ref 22–32)
Calcium: 8.9 mg/dL (ref 8.9–10.3)
Calcium: 8.9 mg/dL (ref 8.9–10.3)
Chloride: 98 mmol/L (ref 98–111)
Chloride: 100 mmol/L (ref 98–111)
Creatinine, Ser: 0.9 mg/dL (ref 0.61–1.24)
Creatinine, Ser: 0.86 mg/dL (ref 0.61–1.24)
GFR calc Af Amer: >60 mL/min (ref >60)
GFR calc Af Amer: >60 mL/min (ref >60)
GFR calc non Af Amer: >60 mL/min (ref >60)
GFR calc non Af Amer: >60 mL/min (ref >60)
Glucose, Bld: 172 mg/dL — ABNORMAL HIGH (ref 70–99)
Glucose, Bld: 106 mg/dL — ABNORMAL HIGH (ref 70–99)
Potassium: 4.9 mmol/L (ref 3.5–5.1)
Potassium: 5.1 mmol/L (ref 3.5–5.1)
Sodium: 134 mmol/L — ABNORMAL LOW (ref 135–145)
Sodium: 136 mmol/L (ref 135–145)
Total Bilirubin: 1.1 mg/dL (ref 0.3–1.2)
Total Bilirubin: 1.1 mg/dL (ref 0.3–1.2)
Total Protein: 7.5 g/dL (ref 6.5–8.1)
Total Protein: 7.6 g/dL (ref 6.5–8.1)

## 2019-01-12 LAB — SARS CORONAVIRUS 2 BY RT PCR (HOSPITAL ORDER, PERFORMED IN ~~LOC~~ HOSPITAL LAB): SARS Coronavirus 2: NEGATIVE

## 2019-01-12 LAB — TROPONIN I (HIGH SENSITIVITY): Troponin I (High Sensitivity): 19 ng/L — ABNORMAL HIGH (ref <18)

## 2019-01-12 LAB — URINALYSIS, COMPLETE (UACMP) WITH MICROSCOPIC
Bacteria, UA: NONE SEEN
Bilirubin Urine: NEGATIVE
Glucose, UA: NEGATIVE mg/dL
Hgb urine dipstick: NEGATIVE
Ketones, ur: NEGATIVE mg/dL
Leukocytes,Ua: NEGATIVE
Nitrite: NEGATIVE
Protein, ur: NEGATIVE mg/dL
Specific Gravity, Urine: 1.034 — ABNORMAL HIGH (ref 1.005–1.030)
Squamous Epithelial / HPF: NONE SEEN (ref 0–5)
pH: 6 (ref 5.0–8.0)

## 2019-01-12 LAB — CBC
HCT: 33.2 % — ABNORMAL LOW (ref 39.0–52.0)
Hemoglobin: 10.9 g/dL — ABNORMAL LOW (ref 13.0–17.0)
MCH: 26.5 pg (ref 26.0–34.0)
MCHC: 32.8 g/dL (ref 30.0–36.0)
MCV: 80.6 fL (ref 80.0–100.0)
Platelets: 352 10*3/uL (ref 150–400)
RBC: 4.12 MIL/uL — ABNORMAL LOW (ref 4.22–5.81)
RDW: 14.7 % (ref 11.5–15.5)
WBC: 21.9 10*3/uL — ABNORMAL HIGH (ref 4.0–10.5)
nRBC: 0 % (ref 0.0–0.2)

## 2019-01-12 MED ORDER — IPRATROPIUM-ALBUTEROL 0.5-2.5 (3) MG/3ML IN SOLN
3.0000 mL | RESPIRATORY_TRACT | Status: DC
  Administered 2019-01-12 – 2019-01-13 (×6): 3 mL via RESPIRATORY_TRACT
  Filled 2019-01-12 (×5): qty 3

## 2019-01-12 MED ORDER — ACETAMINOPHEN 325 MG PO TABS: 650.0000 mg | ORAL_TABLET | Freq: Four times a day (QID) | ORAL | Status: DC | PRN

## 2019-01-12 MED ORDER — DOCUSATE SODIUM 100 MG PO CAPS
100.0000 mg | ORAL_CAPSULE | Freq: Two times a day (BID) | ORAL | Status: DC
  Administered 2019-01-12 – 2019-01-13 (×3): 100 mg via ORAL
  Filled 2019-01-12 (×3): qty 1

## 2019-01-12 MED ORDER — HYDROCOD POLST-CPM POLST ER 10-8 MG/5ML PO SUER
5.0000 mL | Freq: Two times a day (BID) | ORAL | Status: DC
  Administered 2019-01-12 – 2019-01-13 (×4): 5 mL via ORAL
  Filled 2019-01-12 (×4): qty 5

## 2019-01-12 MED ORDER — IOHEXOL 350 MG/ML SOLN
75.0000 mL | Freq: Once | INTRAVENOUS | Status: AC | PRN
  Administered 2019-01-12: 75 mL via INTRAVENOUS

## 2019-01-12 MED ORDER — SODIUM CHLORIDE 0.9 % IV SOLN
INTRAVENOUS | Status: DC
  Administered 2019-01-12 – 2019-01-14 (×4): via INTRAVENOUS

## 2019-01-12 MED ORDER — ACETAMINOPHEN 650 MG RE SUPP: 650.0000 mg | Freq: Four times a day (QID) | RECTAL | Status: DC | PRN

## 2019-01-12 MED ORDER — SODIUM CHLORIDE 0.9 % IV BOLUS
1000.0000 mL | Freq: Once | INTRAVENOUS | Status: AC
  Administered 2019-01-12: 1000 mL via INTRAVENOUS

## 2019-01-12 MED ORDER — ENOXAPARIN SODIUM 40 MG/0.4ML ~~LOC~~ SOLN
40.0000 mg | SUBCUTANEOUS | Status: DC
  Administered 2019-01-12 – 2019-01-13 (×2): 40 mg via SUBCUTANEOUS
  Filled 2019-01-12 (×2): qty 0.4

## 2019-01-12 MED ORDER — ONDANSETRON HCL 4 MG PO TABS: 4.0000 mg | ORAL_TABLET | Freq: Four times a day (QID) | ORAL | Status: DC | PRN

## 2019-01-12 MED ORDER — ONDANSETRON HCL 4 MG/2ML IJ SOLN: 4.0000 mg | Freq: Four times a day (QID) | INTRAMUSCULAR | Status: DC | PRN

## 2019-01-12 NOTE — ED Notes (Signed)
Pt states he cannot provide urine sample at this time. ?

## 2019-01-12 NOTE — ED Notes (Signed)
Attempted to call report

## 2019-01-12 NOTE — ED Triage Notes (Signed)
Pt reports cough for couple of months and dizzy for the last few weeks. Pt states feel weak.

## 2019-01-12 NOTE — ED Notes (Signed)
Pt's states still unable to urinate but will try

## 2019-01-12 NOTE — ED Notes (Signed)
Patient transported to CT 

## 2019-01-12 NOTE — Progress Notes (Signed)
Patient has constant dry cough, MD notified, orders received for tussionex bid.

## 2019-01-12 NOTE — H&P (Signed)
[facility] [place] NAME: Jose Dunlap    MR#:  [id number]  DATE OF BIRTH:  23-Feb-1951  DATE OF ADMISSION:  01/12/2019  PRIMARY CARE PHYSICIAN: Olin Hauser, DO   REQUESTING/REFERRING PHYSICIAN: Dr. Harvest Dark  CHIEF COMPLAINT: On heparin can you change your name go to treatment team go to treatment team go back okay for her to put your name go to treatment team and can you put your name [facility] sign   Chief Complaint  Patient presents with  . Cough  . Dizziness    HISTORY OF PRESENT ILLNESS:  Jose Dunlap  is a 68 y.o. male with a known history of essential hypertension, hyperlipidemia,  pacemaker placement comes in because of generalized weakness, dizziness.  Patient has been having cough, shortness of breath, recently finished 2 rounds of antibiotics his cough, COVID-19 test has been negative for 2 times however he is continues to have dry cough, was treated with azithromycin, also started on prednisone recently along with albuterol.  Patient came to ER today because of persistent weakness, shortness of breath with dry cough.  CT chest in the emergency room essentially is negative.  Because of feeling dizzy and weak for last 2 weeks and COVID-19 test has been negative again this time, ultrasound of the gallbladder showed no gallbladder pathology but concerning for metastatic disease.  Because of his history of heart disease and pacemaker and now with hypotension admitted to telemetry.  Patient also found to have elevated LFTs with AST of 53, ALT of 75, alk phos of 532.  PAST MEDICAL HISTORY:   Past Medical History:  Diagnosis Date  . Essential hypertension   . Hyperlipidemia   . MI (myocardial infarction) ([place])   . Obesity   . Pacemaker   . Shortness of breath dyspnea     PAST SURGICAL HISTOIRY:   Past Surgical History:  Procedure Laterality Date  . ABDOMINAL HERNIA REPAIR  2001  . BACK SURGERY  1986  .  COLONOSCOPY WITH PROPOFOL N/A 05/19/2015   Procedure: COLONOSCOPY WITH PROPOFOL;  Surgeon: Hulen Luster, MD;  Location[facility] END[facility]COPY;  Service: Gastroenterology;  Laterality: N/A;  . CORONARY ANGIOPLASTY    . INSERT / REPLACE / REMOVE PACEMAKER    . KNEE ARTHROSCOPY    . PACEMAKER PLACEMENT    . PROSTATE SURGERY    . TONSILLECTOMY      SOCIAL HISTORY:   Social History   Tobacco Use  . Smoking status: Former Research scientist (life sciences)  . Smokeless tobacco: Former Network engineer Use Topics  . Alcohol use: Yes    FAMILY HISTORY:  No family history on file.  DRUG ALLERGIES:  No Known Allergies  REVIEW OF SYSTEMS:  CONSTITUTIONAL: No fever, patient states that he feels exhausted.  EYES: No blurred or double vision.  EARS, NOSE, AND THROAT: No tinnitus or ear pain.  RESPIRATORY: patient has been having dry cough for almost 2 weeks.  CARDIOVASCULAR: No chest pain, orthopnea, edema.  GASTROINTESTINAL: No nausea, vomiting, diarrhea or abdominal pain.  GENITOURINARY: No dysuria, hematuria.  ENDOCRINE: No polyuria, nocturia,  HEMATOLOGY: No anemia, easy bruising or bleeding SKIN: No rash or lesion. MUSCULOSKELETAL: No joint pain or arthritis.   NEUROLOGIC: No tingling, numbness, weakness.  PSYCHIATRY: No anxiety or depression.   MEDICATIONS AT HOME:   Prior to Admission medications   Medication Sig Start Date End Date Taking? Authorizing Provider  albuterol (VENTOLIN HFA) 108 (90 Base) MCG/ACT inhaler  Inhale 2 puffs into the lungs every 4 (four) hours as needed for wheezing or shortness of breath (cough). 12/30/18  Yes Karamalegos, Devonne Doughty, DO  aspirin 81 MG tablet Chew 81 mg by mouth daily. 03/24/14  Yes [provider]  Cholecalciferol (D 1000) 1000 units capsule Take 1 capsule by mouth daily.   Yes [provider]  clopidogrel (PLAVIX) 75 MG tablet Take 1 tablet (75 mg total) by mouth daily. 12/30/18  Yes Karamalegos, Devonne Doughty, DO  lisinopril (PRINIVIL,ZESTRIL) 30 MG tablet  Take 1 tablet (30 mg total) by mouth daily. 03/14/18  Yes Karamalegos, Devonne Doughty, DO  Multiple Vitamin (MULTIVITAMIN) capsule Take 1 capsule by mouth daily.   Yes [provider]  nebivolol (BYSTOLIC) 5 MG tablet Take 1 tablet (5 mg total) by mouth daily. 12/30/18  Yes Karamalegos, Devonne Doughty, DO  spironolactone (ALDACTONE) 25 MG tablet Take 1 tablet (25 mg total) by mouth daily. 12/30/18  Yes Karamalegos, Devonne Doughty, DO  vitamin B-12 (CYANOCOBALAMIN) 250 MCG tablet Take 1 tablet by mouth daily.   Yes [provider]  azithromycin (ZITHROMAX Z-PAK) 250 MG tablet Take 2 tabs (581m total) on Day 1. Take 1 tab (2519m daily for next 4 days. Patient not taking: Reported on 01/12/2019 12/30/18   KaOlin HauserDO  fluticasone ([facility] MCG/ACT nasal spray Place 2 sprays into both nostrils daily. Patient not taking: Reported on 01/12/2019 09/24/18   HaEvelina Dun, FNP  furosemide (LASIX) 20 MG tablet Take 1 tablet (20 mg total) by mouth daily. Patient taking differently: Take 20 mg by mouth as needed.  02/14/16   HaArlis Porta MD  Icosapent Ethyl (VASCEPA) 1 g CAPS Take 2 g by mouth 2 (two) times daily. Patient not taking: Reported on 12/30/2018 08/13/17   KaOlin HauserDO  Magnesium Cl-Calcium Carbonate (SLOW MAGNESIUM/CALCIUM) 70-117 MG TBEC Take 1 tablet by mouth daily.     [provider]  nitroGLYCERIN (NITROSTAT) 0.4 MG SL tablet Place 1 tablet (0.4 mg total) under the tongue as needed. 12/30/18   Karamalegos, AlDevonne DoughtyDO  Omega-3 Fatty Acids (FISH OIL PO) Take 1 capsule by mouth 2 (two) times daily.     [provider]  predniSONE (DELTASONE) 50 MG tablet Take 1 tablet (50 mg total) by mouth daily with breakfast. Patient not taking: Reported on 01/12/2019 12/30/18   KaOlin HauserDO      VITAL SIGNS:  Blood pressure (!) 113/92, pulse 81, temperature 98.9 F (37.2 C), temperature source Oral, resp. rate (!) 24, height  6' (1.829 m), weight 101.1 kg, SpO2 97 %.  PHYSICAL EXAMINATION:  GENERAL:  [age].o.-year-old patient lying in the bed with no acute distress.  Appears dehydrated. EYES: Pupils equal, round, reactive to light and accommodation. No scleral icterus. Extraocular muscles intact.  HEENT: Head atraumatic, normocephalic. Oropharynx and nasopharynx clear.  NECK:  Supple, no jugular venous distention. No thyroid enlargement, no tenderness.  LUNGS: diminished breath sounds bilaterally. CARDIOVASCULAR: S1, S2 normal. No murmurs, rubs, or gallops.  ABDOMEN: Soft, nontender, nondistended. Bowel sounds present. No organomegaly or mass.  EXTREMITIES: No pedal edema, cyanosis, or clubbing.  NEUROLOGIC: Cranial nerves II through XII are intact. Muscle strength 5/5 in all extremities. Sensation intact. Gait not checked.  PSYCHIATRIC: The patient is alert and oriented x 3.  SKIN: No obvious rash, lesion, or ulcer.   LABORATORY PANEL:   CBC Recent Labs  Lab 01/12/19 0947  WBC 21.9*  HGB 10.9*  HCT 33.2*  PLT 352   ------------------------------------------------------------------------------------------------------------------  Chemistries  Recent Labs  Lab 01/12/19 1651  NA 136  K 5.1  CL 100  CO2 24  GLUCOSE 106*  BUN 14  CREATININE 0.86  CALCIUM 8.9  AST 45*  ALT 69*  ALKPHOS 524*  BILITOT 1.1   ------------------------------------------------------------------------------------------------------------------  Cardiac Enzymes No results for input(s): TROPONINI in the last 168 hours. ------------------------------------------------------------------------------------------------------------------  RADIOLOGY:  Ct Angio Chest Pe W And/or Wo Contrast  Result Date: 01/12/2019 CLINICAL DATA:  Cough for couple months, dizziness for few weeks, weakness, history pacemaker, coronary artery disease post angioplasty and MI, essential hypertension, hyperlipidemia, obesity, former smoker EXAM: CT  ANGIOGRAPHY CHEST WITH CONTRAST TECHNIQUE: Multidetector CT imaging of the chest was performed using the standard protocol during bolus administration of intravenous contrast. Multiplanar CT image reconstructions and MIPs were obtained to evaluate the vascular anatomy. CONTRAST:  75m OMNIPAQUE IOHEXOL 350 MG/ML SOLN IV COMPARISON:  None FINDINGS: Cardiovascular: Atherosclerotic calcifications aorta, proximal great vessels and coronary arteries. Ascending thoracic aorta upper normal caliber without aneurysm or dissection. Pacemaker leads via LEFT subclavian approach project at RIGHT atrium, RIGHT ventricle, and coronary sinus. No pericardial effusion. Pulmonary arteries adequately opacified. Beam hardening artifacts from pacemaker leads and contrast in the SVC traverse the pulmonary arteries. No definite pulmonary emboli identified. Mediastinum/Nodes: Esophagus unremarkable. Base of cervical region normal appearance. No thoracic adenopathy. Minimal gynecomastia bilaterally. Lungs/Pleura: Minimal atelectasis at LEFT base. Lungs otherwise clear. No pulmonary infiltrate, pleural effusion, or pneumothorax. Upper Abdomen: Visualized upper abdomen unremarkable Musculoskeletal: No acute osseous findings. Review of the MIP images confirms the above findings. IMPRESSION: No definite evidence of pulmonary embolism. Scattered atherosclerotic disease changes and pacemaker. Minimal LEFT basilar atelectasis without pulmonary infiltrate. Aortic Atherosclerosis (ICD10-I70.0). Electronically Signed   By: MLavonia DanaM.D.   On: 01/12/2019 12:17   Dg Chest Portable 1 View  Result Date: 01/12/2019 CLINICAL DATA:  Cough.  Dizziness. EXAM: PORTABLE CHEST 1 VIEW COMPARISON:  One-view chest x-ray 10/23/2017 FINDINGS: The heart size is normal. Atherosclerotic calcifications are present at the aortic arch. Pacing wires are in place. Lung volumes are low. No focal airspace disease is present. There is no edema or effusion. IMPRESSION: 1.  Low lung volumes. 2. Cardiac atherosclerosis. 3. No acute cardiopulmonary disease. Electronically Signed   By: CSan MorelleM.D.   On: 01/12/2019 10:28   UKoreaAbdomen Limited Ruq  Result Date: 01/12/2019 CLINICAL DATA:  Elevated liver function studies including alkaline phosphatase. Cough and dehydration. EXAM: ULTRASOUND ABDOMEN LIMITED RIGHT UPPER QUADRANT COMPARISON:  Chest CTA same date. FINDINGS: Gallbladder: No gallstones or wall thickening visualized. No sonographic Murphy sign noted by sonographer. Common bile duct: Diameter: 3 mm Liver: The liver is diffusely heterogeneous in echotexture. There are multiple predominately hypoechoic liver lesions. The largest of these measure up to 2.6 x 1.9 x 2.8 cm in the right lobe and up to 3.7 x 2.6 x 4.5 cm in the left lobe. These are not well visualized on the chest CTA done earlier. Portal vein is patent on color Doppler imaging with normal direction of blood flow towards the liver. IMPRESSION: 1. The gallbladder and biliary system appear normal. 2. Multiple hypoechoic liver lesions are worrisome for metastatic disease. Further evaluation recommended, ideally with MRI without and with contrast. Since the patient IV contrast for a chest CTA earlier today, if evaluated by CT, that should be delayed by approximately 24 hours. Electronically Signed   By: WRichardean SaleM.D.   On: 01/12/2019  13:45    EKG:   Orders placed or performed during the hospital encounter of 01/12/19  . EKG 12-Lead  . EKG 12-Lead  . ED EKG  . ED EKG    IMPRESSION AND PLAN:   68 year old male with multiple medical problems of essential hypertension, ischemic cardiomyopathy with EF of 30% follows up with Dr. Ubaldo Glassing, history of pacemaker, defibrillator comes in because of generalized weakness, dizziness and found to have hypotension. #1. generalized weakness, dizziness due to hypotension, l due to poor p.o. intake, hold Bystolic, Aldactone, lisinopril secondary to hypotension.   Continue gentle hydration.  Patient says that IV fluids are helping him. 2.  ischemic cardiomyopathy with EF of 30% history of ICD, PPM placement, follows up with Dr. Ubaldo Glassing, we held her lisinopril at this time due to his cough. Consider Consult Dr. Ubaldo Glassing from cardiology in [place] #3. acute bronchitis, recently was on Z-Pak, started on prednisone, leukocytosis likely due to prednisone.  Patient has persistent cough, COVID-19 test has been -4 times recently on June 23 and also today. #4 .possible hepatic mets, elevated LFTs, consult gastroenterology, oncology, discussed with patient.  Discussed the lab findings with the patient. All the records are reviewed and case discussed with ED provider. Management plans discussed with the patient, family and they are in agreement.  CODE STATUS: Full code  TOTAL TIME TAKING CARE OF THIS PATIENT: 55 minutes.    Epifanio Lesches M.D on 01/12/2019 at 5:51 PM  Between 7am to 6pm - Pager - [phone]  After 6pm go to [email or web address] - password [email or web address] [facility]  Office  [phone]  CC: Primary care physician; Olin Hauser, DO  Note: This dictation was prepared with Dragon dictation along with smaller phrase technology. Any transcriptional errors that result from this process are unintentional.

## 2019-01-12 NOTE — ED Notes (Signed)
ED TO INPATIENT HANDOFF REPORT  ED Nurse Name and Phone #: Mahari Vankirk [phone]  S Name/Age/Gender Laurence Compton 68 y.o. male Room/Bed: ED09A/ED09A  Code Status   Code Status: Full Code  Home/SNF/Other Home Patient oriented to: self, place, time and situation Is this baseline? Yes   Triage Complete: Triage complete  Chief Complaint cough/dizzy  Triage Note Pt reports cough for couple of months and dizzy for the last few weeks. Pt states feel weak.   Allergies No Known Allergies  Level of Care/Admitting Diagnosis ED Disposition    ED Disposition Condition [facility] Area: [place] [[id number]]  Level of Care: Telemetry [5]  Covid Evaluation: Confirmed COVID Negative  Diagnosis: Hypotension [127517]  Admitting Physician: Epifanio Lesches [[id number]]  Attending Physician: Epifanio Lesches [phone]  Estimated length of stay: past midnight tomorrow  Certification:: I certify this patient will need inpatient services for at least 2 midnights  PT Class (Do Not Modify): Inpatient [101]  PT Acc Code (Do Not Modify): Private [1]       B Medical/Surgery History Past Medical History:  Diagnosis Date  . Essential hypertension   . Hyperlipidemia   . MI (myocardial infarction) ([place])   . Obesity   . Pacemaker   . Shortness of breath dyspnea    Past Surgical History:  Procedure Laterality Date  . ABDOMINAL HERNIA REPAIR  2001  . BACK SURGERY  1986  . COLONOSCOPY WITH PROPOFOL N/A 05/19/2015   Procedure: COLONOSCOPY WITH PROPOFOL;  Surgeon: Hulen Luster, MD;  Location: [facility] ENDOSCOPY;  Service: Gastroenterology;  Laterality: N/A;  . CORONARY ANGIOPLASTY    . INSERT / REPLACE / REMOVE PACEMAKER    . KNEE ARTHROSCOPY    . PACEMAKER PLACEMENT    . PROSTATE SURGERY    . TONSILLECTOMY       A IV Location/Drains/Wounds Patient Lines/Drains/Airways Status   Active Line/Drains/Airways    Name:   Placement date:   Placement time:   Site:    Days:   Peripheral IV 01/12/19 Left Antecubital   01/12/19    1000    Antecubital   less than 1   Airway   05/19/15    0752     1334          Intake/Output Last 24 hours  Intake/Output Summary (Last 24 hours) at 01/12/2019 1540 Last data filed at 01/12/2019 1354 Gross per 24 hour  Intake 1000 ml  Output -  Net 1000 ml    Labs/Imaging Results for orders placed or performed during the hospital encounter of 01/12/19 (from the past 48 hour(s))  CBC     Status: Abnormal   Collection Time: 01/12/19  9:47 AM  Result Value Ref Range   WBC 21.9 (H) 4.0 - 10.5 K/uL   RBC 4.12 (L) 4.22 - 5.81 MIL/uL   Hemoglobin 10.9 (L) 13.0 - 17.0 g/dL   HCT 33.2 (L) 39.0 - 52.0 %   MCV 80.6 80.0 - 100.0 fL   MCH 26.5 26.0 - 34.0 pg   MCHC 32.8 30.0 - 36.0 g/dL   RDW 14.7 11.5 - 15.5 %   Platelets 352 150 - 400 K/uL   nRBC 0.0 0.0 - 0.2 %    Comment: Performed at [facility], [address] [place], [place] [address]  Comprehensive metabolic panel     Status: Abnormal   Collection Time: 01/12/19  9:47 AM  Result Value Ref Range   Sodium 134 (L) 135 -  145 mmol/L   Potassium 4.9 3.5 - 5.1 mmol/L   Chloride 98 98 - 111 mmol/L   CO2 25 22 - 32 mmol/L   Glucose, Bld 172 (H) 70 - 99 mg/dL   BUN 16 8 - 23 mg/dL   Creatinine, Ser 0.90 0.61 - 1.24 mg/dL   Calcium 8.9 8.9 - 10.3 mg/dL   Total Protein 7.5 6.5 - 8.1 g/dL   Albumin 2.9 (L) 3.5 - 5.0 g/dL   AST 53 (H) 15 - 41 U/L   ALT 75 (H) 0 - 44 U/L   Alkaline Phosphatase 532 (H) 38 - 126 U/L   Total Bilirubin 1.1 0.3 - 1.2 mg/dL   GFR calc non Af Amer >60 >60 mL/min   GFR calc Af Amer >60 >60 mL/min   Anion gap 11 5 - 15    Comment: Performed at [facility], [place], [place] [address]  Troponin I (High Sensitivity)     Status: Abnormal   Collection Time: 01/12/19  9:47 AM  Result Value Ref Range   Troponin I (High Sensitivity) 19 (H) <18 ng/L    Comment: (NOTE) Elevated high sensitivity troponin I (hsTnI)  values and significant  changes across serial measurements may suggest ACS but many other  chronic and acute conditions are known to elevate hsTnI results.  Refer to the "Links" section for chest pain algorithms and additional  guidance. Performed at [facility], [address] [place], [place] [address]   Urinalysis, Complete w Microscopic     Status: Abnormal   Collection Time: 01/12/19 10:15 AM  Result Value Ref Range   Color, Urine YELLOW (A) YELLOW   APPearance CLEAR (A) CLEAR   Specific Gravity, Urine 1.034 (H) 1.005 - 1.030   pH 6.0 5.0 - 8.0   Glucose, UA NEGATIVE NEGATIVE mg/dL   Hgb urine dipstick NEGATIVE NEGATIVE   Bilirubin Urine NEGATIVE NEGATIVE   Ketones, ur NEGATIVE NEGATIVE mg/dL   Protein, ur NEGATIVE NEGATIVE mg/dL   Nitrite NEGATIVE NEGATIVE   Leukocytes,Ua NEGATIVE NEGATIVE   WBC, UA 0-5 0 - 5 WBC/hpf   Bacteria, UA NONE SEEN NONE SEEN   Squamous Epithelial / LPF NONE SEEN 0 - 5    Comment: Performed at [facility], [address], [place], [place] [address]  SARS Coronavirus 2 (CEPHEID - Performed in [place] hospital lab), Hosp Order     Status: None   Collection Time: 01/12/19 10:16 AM   Specimen: Nasopharyngeal Swab  Result Value Ref Range   SARS Coronavirus 2 NEGATIVE NEGATIVE    Comment: (NOTE) If result is NEGATIVE SARS-CoV-2 target nucleic acids are NOT DETECTED. The SARS-CoV-2 RNA is generally detectable in upper and lower  respiratory specimens during the acute phase of infection. The lowest  concentration of SARS-CoV-2 viral copies this assay can detect is 250  copies / mL. A negative result does not preclude SARS-CoV-2 infection  and should not be used as the sole basis for treatment or other  patient management decisions.  A negative result may occur with  improper specimen collection / handling, submission of specimen other  than nasopharyngeal swab, presence of viral mutation(s) within the  areas targeted by this  assay, and inadequate number of viral copies  (<250 copies / mL). A negative result must be combined with clinical  observations, patient history, and epidemiological information. If result is POSITIVE SARS-CoV-2 target nucleic acids are DETECTED. The SARS-CoV-2 RNA is generally detectable in upper and  lower  respiratory specimens dur ing the acute phase of infection.  Positive  results are indicative of active infection with SARS-CoV-2.  Clinical  correlation with patient history and other diagnostic information is  necessary to determine patient infection status.  Positive results do  not rule out bacterial infection or co-infection with other viruses. If result is PRESUMPTIVE POSTIVE SARS-CoV-2 nucleic acids MAY BE PRESENT.   A presumptive positive result was obtained on the submitted specimen  and confirmed on repeat testing.  While 2019 novel coronavirus  (SARS-CoV-2) nucleic acids may be present in the submitted sample  additional confirmatory testing may be necessary for epidemiological  and / or clinical management purposes  to differentiate between  SARS-CoV-2 and other Sarbecovirus currently known to infect humans.  If clinically indicated additional testing with an alternate test  methodology [phone]) is advised. The SARS-CoV-2 RNA is generally  detectable in upper and lower respiratory sp ecimens during the acute  phase of infection. The expected result is Negative. Fact Sheet for Patients:  StrictlyIdeas.no Fact Sheet for Healthcare Providers: BankingDealers.co.za This test is not yet approved or cleared by the [place] FDA and has been authorized for detection and/or diagnosis of SARS-CoV-2 by FDA under an Emergency Use Authorization (EUA).  This EUA will remain in effect (meaning this test can be used) for the duration of the COVID-19 declaration under Section 564(b)(1) of the Act, 21 U.S.C. section 360bbb-3(b)(1),  unless the authorization is terminated or revoked sooner. Performed at [facility], [place], [place] [address]    Ct Angio Chest Pe W And/or Wo Contrast  Result Date: 01/12/2019 CLINICAL DATA:  Cough for couple months, dizziness for few weeks, weakness, history pacemaker, coronary artery disease post angioplasty and MI, essential hypertension, hyperlipidemia, obesity, former smoker EXAM: CT ANGIOGRAPHY CHEST WITH CONTRAST TECHNIQUE: Multidetector CT imaging of the chest was performed using the standard protocol during bolus administration of intravenous contrast. Multiplanar CT image reconstructions and MIPs were obtained to evaluate the vascular anatomy. CONTRAST:  57mL OMNIPAQUE IOHEXOL 350 MG/ML SOLN IV COMPARISON:  None FINDINGS: Cardiovascular: Atherosclerotic calcifications aorta, proximal great vessels and coronary arteries. Ascending thoracic aorta upper normal caliber without aneurysm or dissection. Pacemaker leads via LEFT subclavian approach project at RIGHT atrium, RIGHT ventricle, and coronary sinus. No pericardial effusion. Pulmonary arteries adequately opacified. Beam hardening artifacts from pacemaker leads and contrast in the SVC traverse the pulmonary arteries. No definite pulmonary emboli identified. Mediastinum/Nodes: Esophagus unremarkable. Base of cervical region normal appearance. No thoracic adenopathy. Minimal gynecomastia bilaterally. Lungs/Pleura: Minimal atelectasis at LEFT base. Lungs otherwise clear. No pulmonary infiltrate, pleural effusion, or pneumothorax. Upper Abdomen: Visualized upper abdomen unremarkable Musculoskeletal: No acute osseous findings. Review of the MIP images confirms the above findings. IMPRESSION: No definite evidence of pulmonary embolism. Scattered atherosclerotic disease changes and pacemaker. Minimal LEFT basilar atelectasis without pulmonary infiltrate. Aortic Atherosclerosis (ICD10-I70.0). Electronically Signed   By: Lavonia Dana M.D.   On: 01/12/2019 12:17   Dg Chest Portable 1 View  Result Date: 01/12/2019 CLINICAL DATA:  Cough.  Dizziness. EXAM: PORTABLE CHEST 1 VIEW COMPARISON:  One-view chest x-ray 10/23/2017 FINDINGS: The heart size is normal. Atherosclerotic calcifications are present at the aortic arch. Pacing wires are in place. Lung volumes are low. No focal airspace disease is present. There is no edema or effusion. IMPRESSION: 1. Low lung volumes. 2. Cardiac atherosclerosis. 3. No acute cardiopulmonary disease. Electronically Signed   By: San Morelle M.D.   On: 01/12/2019 10:28  US Abdomen Limited Ruq  Result Date: 01/12/2019 CLINICAL DATA:  Elevated liver function studies including alkaline phosphatase. Cough and dehydration. EXAM: ULTRASOUND ABDOMEN LIMITED RIGHT UPPER QUADRANT COMPARISON:  Chest CTA same date. FINDINGS: Gallbladder: No gallstones or wall thickening visualized. No sonographic Murphy sign noted by sonographer. Common bile duct: Diameter: 3 mm Liver: The liver is diffusely heterogeneous in echotexture. There are multiple predominately hypoechoic liver lesions. The largest of these measure up to 2.6 x 1.9 x 2.8 cm in the right lobe and up to 3.7 x 2.6 x 4.5 cm in the left lobe. These are not well visualized on the chest CTA done earlier. Portal vein is patent on color Doppler imaging with normal direction of blood flow towards the liver. IMPRESSION: 1. The gallbladder and biliary system appear normal. 2. Multiple hypoechoic liver lesions are worrisome for metastatic disease. Further evaluation recommended, ideally with MRI without and with contrast. Since the patient IV contrast for a chest CTA earlier today, if evaluated by CT, that should be delayed by approximately 24 hours. Electronically Signed   By: Richardean Sale M.D.   On: 01/12/2019 13:45    Pending Labs Unresulted Labs (From admission, onward)    Start     Ordered   01/19/19 0500  Creatinine, serum  (enoxaparin (LOVENOX)     CrCl >/= 30 ml/min)  Weekly,   STAT    Comments: while on enoxaparin therapy    01/12/19 1520   01/13/19 9470  Basic metabolic panel  Tomorrow morning,   STAT     01/12/19 1520   01/13/19 0500  CBC  Tomorrow morning,   STAT     01/12/19 1520   01/12/19 1521  Comprehensive metabolic panel  Once,   STAT     01/12/19 1530   01/12/19 1514  HIV antibody (Routine Testing)  Once,   STAT     01/12/19 1520   01/12/19 1514  CBC  (enoxaparin (LOVENOX)    CrCl >/= 30 ml/min)  Once,   STAT    Comments: Baseline for enoxaparin therapy IF NOT ALREADY DRAWN.  Notify MD if PLT < 100 K.    01/12/19 1520   01/12/19 1514  Creatinine, serum  (enoxaparin (LOVENOX)    CrCl >/= 30 ml/min)  Once,   STAT    Comments: Baseline for enoxaparin therapy IF NOT ALREADY DRAWN.    01/12/19 1520          Vitals/Pain Today's Vitals   01/12/19 1130 01/12/19 1230 01/12/19 1300 01/12/19 1413  BP: 131/72 120/75 115/60 105/74  Pulse: 85 74 74 73  Resp: (!) 26 (!) 29 (!) 29 (!) 28  Temp:      TempSrc:      SpO2: 97% 97% 96% 95%  Weight:      Height:      PainSc:        Isolation Precautions No active isolations  Medications Medications  0.9 %  sodium chloride infusion (has no administration in time range)  acetaminophen (TYLENOL) tablet 650 mg (has no administration in time range)    Or  acetaminophen (TYLENOL) suppository 650 mg (has no administration in time range)  docusate sodium (COLACE) capsule 100 mg (has no administration in time range)  ondansetron (ZOFRAN) tablet 4 mg (has no administration in time range)    Or  ondansetron (ZOFRAN) injection 4 mg (has no administration in time range)  enoxaparin (LOVENOX) injection 40 mg (has no administration in time range)  ipratropium-albuterol (DUONEB) 0.5-2.5 (  3) MG/3ML nebulizer solution 3 mL (has no administration in time range)  sodium chloride 0.9 % bolus 1,000 mL (0 mLs Intravenous Stopped 01/12/19 1354)  iohexol (OMNIPAQUE) 350 MG/ML injection 75 mL  (75 mLs Intravenous Contrast Given 01/12/19 1154)    Mobility walks Low fall risk   Focused Assessments Cardiac Assessment Handoff:    Lab Results  Component Value Date   TROPONINI <0.03 10/23/2017   No results found for: DDIMER Does the Patient currently have chest pain? No     R Recommendations: See Admitting Provider Note  Report given to:   Additional Notes:

## 2019-01-12 NOTE — ED Provider Notes (Signed)
[facility] Emergency Department Provider Note  Time seen: 10:03 AM  I have reviewed the triage vital signs and the nursing notes.   HISTORY  Chief Complaint Cough and Dizziness   HPI Jose Dunlap is a 68 y.o. male with a past medical history of hypertension, hyperlipidemia, obesity, pacemaker, chronic shortness of breath, presents to the emergency department for cough weakness and dizziness.  According to the patient over the past several months he has had a chronic dry cough is gone 3 rounds of antibiotics without improvement.  States over the past 2 weeks he has been feeling dizzy and weak.  Denies any vomiting or diarrhea, dark stool, chest pain.  States he has been experiencing intermittent fever and chills over the past several weeks.  States he was tested by his doctor for coronavirus 2 weeks ago which was negative.  Denies any abdominal pain.  No chest pain.   Past Medical History:  Diagnosis Date  . Essential hypertension   . Hyperlipidemia   . MI (myocardial infarction) ([place])   . Obesity   . Pacemaker   . Shortness of breath dyspnea     Patient Active Problem List   Diagnosis Date Noted  . NSVT (nonsustained ventricular tachycardia) ([place]) 07/25/2018  . Obesity (BMI 30.0-34.9) 07/25/2018  . CAD (coronary artery disease) 02/06/2017  . S/P angioplasty with stent 02/06/2017  . Environmental and seasonal allergies 11/06/2016  . Pedal edema 01/31/2016  . Pre-diabetes 01/31/2016  . Neuralgia of flank 06/23/2015  . Hyperlipidemia 04/19/2015  . Artificial pacemaker 04/19/2015  . Cardiac defibrillator in place 04/19/2015  . Abnormal weight gain 04/19/2015  . Essential hypertension 04/19/2015  . Artificial cardiac pacemaker 04/19/2015  . Incomplete bladder emptying 04/08/2014  . 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 Benign prostatic hyperplasia with urinary obstruction 01/21/2014  . Frank hematuria  01/21/2014  . Bladder retention 01/21/2014  . BPH with obstruction/lower urinary tract symptoms 01/21/2014  . Automatic implantable cardioverter-defibrillator in situ 12/23/2013  . Hemorrhoid 10/15/2013  . History of non-ST elevation myocardial infarction (NSTEMI) 10/15/2013  . 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 ([place]) 10/15/2013    Past Surgical History:  Procedure Laterality Date  . ABDOMINAL HERNIA REPAIR  2001  . BACK SURGERY  1986  . COLONOSCOPY WITH PROPOFOL N/A 05/19/2015   Procedure: COLONOSCOPY WITH PROPOFOL;  Surgeon: Hulen Luster, MD;  Location: [facility] ENDOSCOPY;  Service: Gastroenterology;  Laterality: N/A;  . CORONARY ANGIOPLASTY    . INSERT / REPLACE / REMOVE PACEMAKER    . KNEE ARTHROSCOPY    . PACEMAKER PLACEMENT    . PROSTATE SURGERY    . TONSILLECTOMY      Prior to Admission medications   Medication Sig Start Date End Date Taking? Authorizing Provider  albuterol (VENTOLIN HFA) 108 (90 Base) MCG/ACT inhaler Inhale 2 puffs into the lungs every 4 (four) hours as needed for wheezing or shortness of breath (cough). 12/30/18   Karamalegos, Devonne Doughty, DO  aspirin 81 MG tablet Chew 81 mg by mouth daily. 03/24/14   [provider]  azithromycin (ZITHROMAX Z-PAK) 250 MG tablet Take 2 tabs (500mg  total) on Day 1. Take 1 tab (250mg ) daily for next 4 days. 12/30/18   Karamalegos, Devonne Doughty, DO  Cholecalciferol (D 1000) 1000 units capsule Take 1 capsule by mouth daily.    [provider]  clopidogrel (PLAVIX) 75 MG tablet Take 1 tablet (75 mg total) by mouth  daily. 12/30/18   Olin Hauser, DO  fluticasone (FLONASE) 50 MCG/ACT nasal spray Place 2 sprays into both nostrils daily. 09/24/18   Sharion Balloon, FNP  furosemide (LASIX) 20 MG tablet Take 1 tablet (20 mg total) by mouth daily. Patient taking differently: Take 20 mg by mouth as needed.  02/14/16   Arlis Porta., MD  Icosapent Ethyl (VASCEPA) 1 g CAPS Take 2 g  by mouth 2 (two) times daily. Patient not taking: Reported on 12/30/2018 08/13/17   Olin Hauser, DO  lisinopril (PRINIVIL,ZESTRIL) 30 MG tablet Take 1 tablet (30 mg total) by mouth daily. 03/14/18   Karamalegos, Devonne Doughty, DO  Magnesium Cl-Calcium Carbonate (SLOW MAGNESIUM/CALCIUM) 70-117 MG TBEC Take 1 tablet by mouth daily.     [provider]  Multiple Vitamin (MULTIVITAMIN) capsule Take 1 capsule by mouth daily.    [provider]  nebivolol (BYSTOLIC) 5 MG tablet Take 1 tablet (5 mg total) by mouth daily. 12/30/18   Karamalegos, Devonne Doughty, DO  nitroGLYCERIN (NITROSTAT) 0.4 MG SL tablet Place 1 tablet (0.4 mg total) under the tongue as needed. 12/30/18   Karamalegos, Devonne Doughty, DO  Omega-3 Fatty Acids (FISH OIL PO) Take 1 capsule by mouth 2 (two) times daily.     [provider]  predniSONE (DELTASONE) 50 MG tablet Take 1 tablet (50 mg total) by mouth daily with breakfast. 12/30/18   Parks Ranger, Devonne Doughty, DO  spironolactone (ALDACTONE) 25 MG tablet Take 1 tablet (25 mg total) by mouth daily. 12/30/18   Karamalegos, Devonne Doughty, DO  vitamin B-12 (CYANOCOBALAMIN) 250 MCG tablet Take 1 tablet by mouth daily.    [provider]    No Known Allergies  No family history on file.  Social History Social History   Tobacco Use  . Smoking status: Former Research scientist (life sciences)  . Smokeless tobacco: Former Network engineer Use Topics  . Alcohol use: Yes  . Drug use: No    Review of Systems Constitutional: Negative for fever.  Positive for generalized weakness/dizziness. ENT: Negative for recent illness/congestion Cardiovascular: Negative for chest pain. Respiratory: Mild shortness of breath.  Frequent dry cough. Gastrointestinal: Negative for abdominal pain, vomiting and diarrhea.  Negative for dark stool. Genitourinary: Negative for urinary compaints Musculoskeletal: Negative for musculoskeletal complaints Skin: Negative for skin complaints  Neurological:  Negative for headache All other ROS negative  ____________________________________________   PHYSICAL EXAM:  VITAL SIGNS: ED Triage Vitals  Enc Vitals Group     BP 01/12/19 0945 110/66     Pulse Rate 01/12/19 0945 86     Resp 01/12/19 0945 20     Temp 01/12/19 0945 99.2 F (37.3 C)     Temp Source 01/12/19 0945 Oral     SpO2 01/12/19 0945 96 %     Weight 01/12/19 0941 220 lb (99.8 kg)     Height 01/12/19 0941 6' (1.829 m)     Head Circumference --      Peak Flow --      Pain Score 01/12/19 0940 0     Pain Loc --      Pain Edu? --      Excl. in [place]? --     Constitutional: Alert and oriented. Well appearing and in no distress. Eyes: Normal exam ENT      Head: Normocephalic and atraumatic.      Mouth/Throat: Mucous membranes are moist. Cardiovascular: Normal rate, regular rhythm.  Respiratory: Normal respiratory effort without tachypnea nor retractions. Breath  sounds are clear.  Dry cough with deep inspiration. Gastrointestinal: Soft and nontender. No distention.  Musculoskeletal: Nontender with normal range of motion in all extremities.  Neurologic:  Normal speech and language. No gross focal neurologic deficits Skin:  Skin is warm, dry and intact.  Psychiatric: Mood and affect are normal.   ____________________________________________    EKG  EKG viewed and interpreted by myself shows an atrial sensed ventricular paced rhythm at 89 bpm with a widened QRS, nonspecific ST changes.  ____________________________________________    RADIOLOGY  Chest x-ray is negative. CTA is negative for acute abnormality. Ultrasound negative for gallbladder pathology but does show concern for possible metastatic disease.  ____________________________________________   INITIAL IMPRESSION / ASSESSMENT AND PLAN / ED COURSE  Pertinent labs & imaging results that were available during my care of the patient were reviewed by me and considered in my medical decision making (see chart  for details).   Patient presents to the emergency department generalized fatigue/weakness along with a dry cough.  Cough is been ongoing times several months weakness for the past 2 to 3 weeks.  Differential is quite broad but would include anemia, metabolic or electrolyte abnormality, infectious etiology such as bronchitis.  We will check labs, chest x-ray and continue to closely monitor.  Patient agreeable to plan of care.  Patient's lab work has shown elevated LFTs as well as significant leukocytosis of 21,000.  Given the patient's persistent cough despite a negative chest x-ray we will proceed with CT imaging of the chest to rule out occult finding.  CT scan of the chest is essentially negative. Ultrasound has resulted negative for gallbladder pathology but concerning for possible metastatic spread.  Given the patient's generalized fatigue weakness with several weeks of symptoms that have progressively become worse we will admit the patient to the hospital for further work-up including likely further imaging of the abdomen/pelvis.  Patient agreeable to plan of care.  Jose Dunlap was evaluated in Emergency Department on 01/12/2019 for the symptoms described in the history of present illness. He was evaluated in the context of the global COVID-19 pandemic, which necessitated consideration that the patient might be at risk for infection with the SARS-CoV-2 virus that causes COVID-19. Institutional protocols and algorithms that pertain to the evaluation of patients at risk for COVID-19 are in a state of rapid change based on information released by regulatory bodies including the CDC and federal and state organizations. These policies and algorithms were followed during the patient's care in the ED.  ____________________________________________   FINAL CLINICAL IMPRESSION(S) / ED DIAGNOSES  Cough Weakness Leukocytosis   Harvest Dark, MD 01/12/19 1435

## 2019-01-12 NOTE — ED Notes (Signed)
Patient requested to provide urine specimen, states he is unable at this time.

## 2019-01-13 ENCOUNTER — Telehealth: Payer: Self-pay

## 2019-01-13 ENCOUNTER — Other Ambulatory Visit: Payer: PPO

## 2019-01-13 ENCOUNTER — Inpatient Hospital Stay
Admit: 2019-01-13 | Discharge: 2019-01-13 | Disposition: A | Discharge status: Home / Self Care | Payer: PPO | Attending: Internal Medicine | Admitting: Internal Medicine

## 2019-01-13 DIAGNOSIS — R945 Abnormal results of liver function studies: Secondary | ICD-10-CM

## 2019-01-13 LAB — GLUCOSE, CAPILLARY: Glucose-Capillary: 113 mg/dL — ABNORMAL HIGH (ref 70–99)

## 2019-01-13 LAB — BASIC METABOLIC PANEL
Anion gap: 10 (ref 5–15)
BUN: 13 mg/dL (ref 8–23)
CO2: 24 mmol/L (ref 22–32)
Calcium: 8.2 mg/dL — ABNORMAL LOW (ref 8.9–10.3)
Chloride: 101 mmol/L (ref 98–111)
Creatinine, Ser: 0.87 mg/dL (ref 0.61–1.24)
GFR calc Af Amer: >60 mL/min (ref >60)
GFR calc non Af Amer: >60 mL/min (ref >60)
Glucose, Bld: 119 mg/dL — ABNORMAL HIGH (ref 70–99)
Potassium: 4.5 mmol/L (ref 3.5–5.1)
Sodium: 135 mmol/L (ref 135–145)

## 2019-01-13 LAB — CBC
HCT: 30.8 % — ABNORMAL LOW (ref 39.0–52.0)
Hemoglobin: 10 g/dL — ABNORMAL LOW (ref 13.0–17.0)
MCH: 26 pg (ref 26.0–34.0)
MCHC: 32.5 g/dL (ref 30.0–36.0)
MCV: 80.2 fL (ref 80.0–100.0)
Platelets: 276 10*3/uL (ref 150–400)
RBC: 3.84 MIL/uL — ABNORMAL LOW (ref 4.22–5.81)
RDW: 14.7 % (ref 11.5–15.5)
WBC: 20.2 10*3/uL — ABNORMAL HIGH (ref 4.0–10.5)
nRBC: 0 % (ref 0.0–0.2)

## 2019-01-13 MED ORDER — ADULT MULTIVITAMIN W/MINERALS CH: 1.0000 | ORAL_TABLET | Freq: Every day | ORAL | Status: DC

## 2019-01-13 MED ORDER — ENSURE ENLIVE PO LIQD
237.0000 mL | Freq: Two times a day (BID) | ORAL | Status: DC
  Administered 2019-01-13: 237 mL via ORAL

## 2019-01-13 MED ORDER — IPRATROPIUM-ALBUTEROL 0.5-2.5 (3) MG/3ML IN SOLN
3.0000 mL | Freq: Four times a day (QID) | RESPIRATORY_TRACT | Status: DC
  Administered 2019-01-13 – 2019-01-14 (×2): 3 mL via RESPIRATORY_TRACT
  Filled 2019-01-13 (×2): qty 3

## 2019-01-13 NOTE — Progress Notes (Signed)
Initial Nutrition Assessment  DOCUMENTATION CODES:   Not applicable  INTERVENTION:   Ensure Enlive po BID, each supplement provides 350 kcal and 20 grams of protein  MVI daily   NUTRITION DIAGNOSIS:   Inadequate oral intake related to acute illness as evidenced by per patient/family report.  GOAL:   Patient will meet greater than or equal to 90% of their needs  MONITOR:   PO intake, Supplement acceptance, Labs, Weight trends, Skin, I & O's  REASON FOR ASSESSMENT:   Malnutrition Screening Tool    ASSESSMENT:   68 year old male with multiple medical problems of essential hypertension, ischemic cardiomyopathy with EF of 30% follows up with Dr. Ubaldo Glassing, history of pacemaker, defibrillator who presented with generalized weakness, dizziness and found to be hypotensive. Per MD note, pt with possible liver metastasis.   RD working remotely.  Pt reports decreased appetite and oral intake for the past several months r/t cough and SOB. Per chart, pt with 16lb(7%) weight loss since January; RD is unsure how recently weight loss occurred. Pt with improved appetite and oral intake in hospital; pt eating 100% of meals. RD will add supplements and MVI to help pt meet his estimated needs. Pt likely at moderate refeed risk; recommend monitor K, Mg and P labs daily.    Medications reviewed and include: colace, lovenox, NaCl _0 /hr  Labs reviewed: alk phos 524(H), AST 45(H), ALT 69(H) Wbc- 20.2(H), Hgb 10.0(L), Hct 30.   Unable to complete Nutrition-Focused physical exam at this time.   Diet Order:   Diet Order            Diet Heart Room service appropriate? Yes; Fluid consistency: Thin  Diet effective now             EDUCATION NEEDS:   No education needs have been identified at this time  Skin:  Skin Assessment: Reviewed RN Assessment  Last BM:  7/7  Height:   Ht Readings from Last 1 Encounters:  01/12/19 6' (1.829 m)    Weight:   Wt Readings from Last 1 Encounters:   01/13/19 101.8 kg    Ideal Body Weight:  80.9 kg  BMI:  Body mass index is 30.43 kg/m.  Estimated Nutritional Needs:   Kcal:  2200-2500kcal/day  Protein:  110-120g/day  Fluid:  2L/day  Koleen Distance MS, RD, LDN Pager #- [phone] Office#- [phone] After Hours Pager: [phone]

## 2019-01-13 NOTE — Progress Notes (Signed)
*  PRELIMINARY RESULTS* Echocardiogram 2D Echocardiogram has been performed.  Sherrie Sport 01/13/2019, 1:34 PM

## 2019-01-13 NOTE — Telephone Encounter (Signed)
The pt called to cancel his Physical Appt scheduled for this Friday. He stated that he was admitted in the hospital on yesterday. Appt cancelled.

## 2019-01-13 NOTE — Progress Notes (Signed)
[facility] [place] NAME: Jose Dunlap    MR#:  [id number]  DATE OF BIRTH:  1950/08/16  SUBJECTIVE:  CHIEF COMPLAINT:   Chief Complaint  Patient presents with  . Cough  . Dizziness   Patient reports that this morning he is feeling sleepy.  He does not feel like he is fatigued or weak, however. He denies any chest pain or shortness of breath.  Patient reports that he has been inside for the past 3 weeks and does not know why he was so dehydrated.  He states that he is feeling much better after receiving fluids through the IV.  REVIEW OF SYSTEMS:  Review of Systems  Constitutional: Negative for malaise/fatigue.  Respiratory: Negative for shortness of breath.   Cardiovascular: Negative for chest pain and palpitations.    DRUG ALLERGIES:  No Known Allergies VITALS:  Blood pressure 110/70, pulse 82, temperature 97.7 F (36.5 C), temperature source Oral, resp. rate 16, height 6' (1.829 m), weight 101.8 kg, SpO2 96 %. PHYSICAL EXAMINATION:  Physical Exam Vitals signs and nursing note reviewed.  Constitutional:      General: He is not in acute distress.    Appearance: Normal appearance.  HENT:     Head: Normocephalic and atraumatic.  Pulmonary:     Effort: Pulmonary effort is normal.  Abdominal:     General: Abdomen is flat.     Palpations: Abdomen is soft.  Neurological:     General: No focal deficit present.     Mental Status: He is alert.  Psychiatric:        Mood and Affect: Mood normal.        Behavior: Behavior normal.    LABORATORY PANEL:  Male CBC Recent Labs  Lab 01/13/19 0549  WBC 20.2*  HGB 10.0*  HCT 30.8*  PLT 276   ------------------------------------------------------------------------------------------------------------------ Chemistries  Recent Labs  Lab 01/12/19 1651 01/13/19 0549  NA 136 135  K 5.1 4.5  CL 100 101  CO2 24 24  GLUCOSE 106* 119*  BUN 14 13  CREATININE 0.86 0.87  CALCIUM 8.9  8.2*  AST 45*  --   ALT 69*  --   ALKPHOS 524*  --   BILITOT 1.1  --    RADIOLOGY:  Ct Angio Chest Pe W And/or Wo Contrast  Result Date: 01/12/2019 CLINICAL DATA:  Cough for couple months, dizziness for few weeks, weakness, history pacemaker, coronary artery disease post angioplasty and MI, essential hypertension, hyperlipidemia, obesity, former smoker EXAM: CT ANGIOGRAPHY CHEST WITH CONTRAST TECHNIQUE: Multidetector CT imaging of the chest was performed using the standard protocol during bolus administration of intravenous contrast. Multiplanar CT image reconstructions and MIPs were obtained to evaluate the vascular anatomy. CONTRAST:  34mL OMNIPAQUE IOHEXOL 350 MG/ML SOLN IV COMPARISON:  None FINDINGS: Cardiovascular: Atherosclerotic calcifications aorta, proximal great vessels and coronary arteries. Ascending thoracic aorta upper normal caliber without aneurysm or dissection. Pacemaker leads via LEFT subclavian approach project at RIGHT atrium, RIGHT ventricle, and coronary sinus. No pericardial effusion. Pulmonary arteries adequately opacified. Beam hardening artifacts from pacemaker leads and contrast in the SVC traverse the pulmonary arteries. No definite pulmonary emboli identified. Mediastinum/Nodes: Esophagus unremarkable. Base of cervical region normal appearance. No thoracic adenopathy. Minimal gynecomastia bilaterally. Lungs/Pleura: Minimal atelectasis at LEFT base. Lungs otherwise clear. No pulmonary infiltrate, pleural effusion, or pneumothorax. Upper Abdomen: Visualized upper abdomen unremarkable Musculoskeletal: No acute osseous findings. Review of the MIP images confirms the above findings. IMPRESSION: No  definite evidence of pulmonary embolism. Scattered atherosclerotic disease changes and pacemaker. Minimal LEFT basilar atelectasis without pulmonary infiltrate. Aortic Atherosclerosis (ICD10-I70.0). Electronically Signed   By: Lavonia Dana M.D.   On: 01/12/2019 12:17   Dg Chest Portable 1  View  Result Date: 01/12/2019 CLINICAL DATA:  Cough.  Dizziness. EXAM: PORTABLE CHEST 1 VIEW COMPARISON:  One-view chest x-ray 10/23/2017 FINDINGS: The heart size is normal. Atherosclerotic calcifications are present at the aortic arch. Pacing wires are in place. Lung volumes are low. No focal airspace disease is present. There is no edema or effusion. IMPRESSION: 1. Low lung volumes. 2. Cardiac atherosclerosis. 3. No acute cardiopulmonary disease. Electronically Signed   By: San Morelle M.D.   On: 01/12/2019 10:28   US Abdomen Limited Ruq  Result Date: 01/12/2019 CLINICAL DATA:  Elevated liver function studies including alkaline phosphatase. Cough and dehydration. EXAM: ULTRASOUND ABDOMEN LIMITED RIGHT UPPER QUADRANT COMPARISON:  Chest CTA same date. FINDINGS: Gallbladder: No gallstones or wall thickening visualized. No sonographic Murphy sign noted by sonographer. Common bile duct: Diameter: 3 mm Liver: The liver is diffusely heterogeneous in echotexture. There are multiple predominately hypoechoic liver lesions. The largest of these measure up to 2.6 x 1.9 x 2.8 cm in the right lobe and up to 3.7 x 2.6 x 4.5 cm in the left lobe. These are not well visualized on the chest CTA done earlier. Portal vein is patent on color Doppler imaging with normal direction of blood flow towards the liver. IMPRESSION: 1. The gallbladder and biliary system appear normal. 2. Multiple hypoechoic liver lesions are worrisome for metastatic disease. Further evaluation recommended, ideally with MRI without and with contrast. Since the patient IV contrast for a chest CTA earlier today, if evaluated by CT, that should be delayed by approximately 24 hours. Electronically Signed   By: Richardean Sale M.D.   On: 01/12/2019 13:45   ASSESSMENT AND PLAN:  68 year old male with multiple medical problems of essential hypertension, ischemic cardiomyopathy with EF of 30% follows up with Dr. Ubaldo Glassing, history of pacemaker, defibrillator  who presented with generalized weakness, dizziness and found to be hypotensive.  1. Hypotension, resolved-likely 2/2 dehydration given improvement with IVF. - Holding bystolic, aldactone, lisinopril - continue IVF - Restart antihypertensives as tolerated> consider switching lisinopril to ARB therapy given recent cough  2. Ischemic cardiomyopathy with h/o ICD, PPM placement, EF 30%  - repeat ECHO pending - holding ACE, bystolic and aldactone as above  3.  Hypoechoic liver lesions concerning for metastasis-noted on RUQ ultrasound on 7/6 -Oncology consulted, appreciate recommendations -GI consulted, appreciate recommendations -Will obtain MRI with and without contrast if ICD compatible, otherwise CT may need to be obtained  4. Acute bronchitis - COVID 19 neg x4 - d/c ACE and start ARB when BP can tolerate - s/p prednisone, z-pak  All the records are reviewed and case discussed with Care Management/Social Worker. Management plans discussed with the patient, family and they are in agreement.  CODE STATUS: Full Code  POSSIBLE D/C IN 1-2 DAYS, DEPENDING ON CLINICAL CONDITION.  [place] Kary Colaizzi, DO PGY-3, [facility]  01/13/2019 at 8:46 AM  Between 7am to 6pm - Pager - [phone]  After 6pm go to [email or web address] - Proofreader  [facility]  Office  [phone]  CC: Primary care physician; Olin Hauser, DO  Note: This dictation was prepared with Dragon dictation along with smaller phrase technology. Any transcriptional errors that result from this process are unintentional.

## 2019-01-13 NOTE — Consult Note (Signed)
Lucilla Lame, MD [facility]  [address], [place], [place] [address] Phone: [phone] Fax : [phone]  Consultation  Referring Provider:     Dr. Vianne Bulls Primary Care Physician:  Olin Hauser, DO Primary Gastroenterologist: Althia Forts         Reason for Consultation:     Abnormal liver enzymes  Date of Admission:  01/12/2019 Date of Consultation:  01/13/2019         HPI:   Jose Dunlap is a 68 y.o. male this patient came in with a report of a cough and dizziness.  The patient states that he was having a cough and was tested for COVID-19 twice however he continued to have a dry cough.  The patient was treated with azithromycin and also given inhalers and prednisone.  He decided to come to the emergency room after continued that shortness of breath weakness and a dry cough.  The patient had a CT scan of the chest in the ER that did not show anything to explain his cough.  When the patient was found to have abnormal liver enzymes he was sent for an ultrasound.  The ultrasound showed: IMPRESSION: 1. The gallbladder and biliary system appear normal. 2. Multiple hypoechoic liver lesions are worrisome for metastatic disease. Further evaluation recommended, ideally with MRI without and with contrast. Since the patient IV contrast for a chest CTA earlier today, if evaluated by CT, that should be delayed by approximately 24 hours.  The patient has a history of having a colonoscopy back in 2016 with polyps found that were not removed because the patient was on anticoagulation and appears that the patient was never brought back for the polyps removal. The patient's liver enzymes on admission showed a ALT of 53 with an AST of 75 and an alkaline phosphatase of 532.  Repeat labs in the afternoon showed similar findings.  The patient denies alcohol usage every day but states that he drinks quite a bit on the weekends.  Past Medical History:  Diagnosis Date  . Essential  hypertension   . Hyperlipidemia   . MI (myocardial infarction) ([place])   . Obesity   . Pacemaker   . Shortness of breath dyspnea     Past Surgical History:  Procedure Laterality Date  . ABDOMINAL HERNIA REPAIR  2001  . BACK SURGERY  1986  . COLONOSCOPY WITH PROPOFOL N/A 05/19/2015   Procedure: COLONOSCOPY WITH PROPOFOL;  Surgeon: Hulen Luster, MD;  Location: [facility] ENDOSCOPY;  Service: Gastroenterology;  Laterality: N/A;  . CORONARY ANGIOPLASTY    . INSERT / REPLACE / REMOVE PACEMAKER    . KNEE ARTHROSCOPY    . PACEMAKER PLACEMENT    . PROSTATE SURGERY    . TONSILLECTOMY      Prior to Admission medications   Medication Sig Start Date End Date Taking? Authorizing Provider  albuterol (VENTOLIN HFA) 108 (90 Base) MCG/ACT inhaler Inhale 2 puffs into the lungs every 4 (four) hours as needed for wheezing or shortness of breath (cough). 12/30/18  Yes Karamalegos, Devonne Doughty, DO  aspirin 81 MG tablet Chew 81 mg by mouth daily. 03/24/14  Yes [provider]  Cholecalciferol (D 1000) 1000 units capsule Take 1 capsule by mouth daily.   Yes [provider]  clopidogrel (PLAVIX) 75 MG tablet Take 1 tablet (75 mg total) by mouth daily. 12/30/18  Yes Karamalegos, Devonne Doughty, DO  lisinopril (PRINIVIL,ZESTRIL) 30 MG tablet Take 1 tablet (30 mg total) by mouth daily. 03/14/18  Yes Olin Hauser, DO  Multiple Vitamin (MULTIVITAMIN) capsule Take 1 capsule by mouth daily.   Yes [provider]  nebivolol (BYSTOLIC) 5 MG tablet Take 1 tablet (5 mg total) by mouth daily. 12/30/18  Yes Karamalegos, Devonne Doughty, DO  spironolactone (ALDACTONE) 25 MG tablet Take 1 tablet (25 mg total) by mouth daily. 12/30/18  Yes Karamalegos, Devonne Doughty, DO  vitamin B-12 (CYANOCOBALAMIN) 250 MCG tablet Take 1 tablet by mouth daily.   Yes [provider]  azithromycin (ZITHROMAX Z-PAK) 250 MG tablet Take 2 tabs (500mg  total) on Day 1. Take 1 tab (250mg ) daily for next 4 days. Patient not  taking: Reported on 01/12/2019 12/30/18   Olin Hauser, DO  fluticasone [facility]) 50 MCG/ACT nasal spray Place 2 sprays into both nostrils daily. Patient not taking: Reported on 01/12/2019 09/24/18   Evelina Dun A, FNP  furosemide (LASIX) 20 MG tablet Take 1 tablet (20 mg total) by mouth daily. Patient taking differently: Take 20 mg by mouth as needed.  02/14/16   Arlis Porta., MD  Icosapent Ethyl (VASCEPA) 1 g CAPS Take 2 g by mouth 2 (two) times daily. Patient not taking: Reported on 12/30/2018 08/13/17   Olin Hauser, DO  Magnesium Cl-Calcium Carbonate (SLOW MAGNESIUM/CALCIUM) 70-117 MG TBEC Take 1 tablet by mouth daily.     [provider]  nitroGLYCERIN (NITROSTAT) 0.4 MG SL tablet Place 1 tablet (0.4 mg total) under the tongue as needed. 12/30/18   Karamalegos, Devonne Doughty, DO  Omega-3 Fatty Acids (FISH OIL PO) Take 1 capsule by mouth 2 (two) times daily.     [provider]  predniSONE (DELTASONE) 50 MG tablet Take 1 tablet (50 mg total) by mouth daily with breakfast. Patient not taking: Reported on 01/12/2019 12/30/18   Olin Hauser, DO    History reviewed. No pertinent family history.   Social History   Tobacco Use  . Smoking status: Former Research scientist (life sciences)  . Smokeless tobacco: Former Network engineer Use Topics  . Alcohol use: Yes  . Drug use: No    Allergies as of 01/12/2019  . (No Known Allergies)    Review of Systems:    All systems reviewed and negative except where noted in HPI.   Physical Exam:  Vital signs in last 24 hours: Temp:  [97.7 F (36.5 C)-98.8 F (37.1 C)] 97.8 F (36.6 C) (07/07 1523) Pulse Rate:  [76-103] 103 (07/07 1523) Resp:  [16-18] 18 (07/07 1523) BP: (107-146)/(69-99) 146/99 (07/07 1523) SpO2:  [95 %-99 %] 96 % (07/07 1646) Weight:  [101.8 kg] 101.8 kg (07/07 0426) Last BM Date: 01/13/19 General:   Pleasant, cooperative in NAD Head:  Normocephalic and atraumatic. Eyes:   No icterus.   Conjunctiva  pink. PERRLA. Ears:  Normal auditory acuity. Neck:  Supple; no masses or thyroidomegaly Lungs: Respirations even and unlabored. Lungs clear to auscultation bilaterally.   No wheezes, crackles, or rhonchi.  Heart:  Regular rate and rhythm;  Without murmur, clicks, rubs or gallops Abdomen:  Soft, nondistended, nontender. Normal bowel sounds. No appreciable masses or hepatomegaly.  No rebound or guarding.  Rectal:  Not performed. Msk:  Symmetrical without gross deformities.    Extremities:  Without edema, cyanosis or clubbing. Neurologic:  Alert and oriented x3;  grossly normal neurologically. Skin:  Intact without significant lesions or rashes. Cervical Nodes:  No significant cervical adenopathy. Psych:  Alert and cooperative. Normal affect.  LAB RESULTS: Recent Labs    01/12/19 0947 01/13/19  0549  WBC 21.9* 20.2*  HGB 10.9* 10.0*  HCT 33.2* 30.8*  PLT 352 276   BMET Recent Labs    01/12/19 0947 01/12/19 1651 01/13/19 0549  NA 134* 136 135  K 4.9 5.1 4.5  CL 98 100 101  CO2 25 24 24   GLUCOSE 172* 106* 119*  BUN 16 14 13   CREATININE 0.90 0.86 0.87  CALCIUM 8.9 8.9 8.2*   LFT Recent Labs    01/12/19 1651  PROT 7.6  ALBUMIN 3.1*  AST 45*  ALT 69*  ALKPHOS 524*  BILITOT 1.1   PT/INR No results for input(s): LABPROT, INR in the last 72 hours.  STUDIES: Ct Angio Chest Pe W And/or Wo Contrast  Result Date: 01/12/2019 CLINICAL DATA:  Cough for couple months, dizziness for few weeks, weakness, history pacemaker, coronary artery disease post angioplasty and MI, essential hypertension, hyperlipidemia, obesity, former smoker EXAM: CT ANGIOGRAPHY CHEST WITH CONTRAST TECHNIQUE: Multidetector CT imaging of the chest was performed using the standard protocol during bolus administration of intravenous contrast. Multiplanar CT image reconstructions and MIPs were obtained to evaluate the vascular anatomy. CONTRAST:  28mL OMNIPAQUE IOHEXOL 350 MG/ML SOLN IV COMPARISON:  None  FINDINGS: Cardiovascular: Atherosclerotic calcifications aorta, proximal great vessels and coronary arteries. Ascending thoracic aorta upper normal caliber without aneurysm or dissection. Pacemaker leads via LEFT subclavian approach project at RIGHT atrium, RIGHT ventricle, and coronary sinus. No pericardial effusion. Pulmonary arteries adequately opacified. Beam hardening artifacts from pacemaker leads and contrast in the SVC traverse the pulmonary arteries. No definite pulmonary emboli identified. Mediastinum/Nodes: Esophagus unremarkable. Base of cervical region normal appearance. No thoracic adenopathy. Minimal gynecomastia bilaterally. Lungs/Pleura: Minimal atelectasis at LEFT base. Lungs otherwise clear. No pulmonary infiltrate, pleural effusion, or pneumothorax. Upper Abdomen: Visualized upper abdomen unremarkable Musculoskeletal: No acute osseous findings. Review of the MIP images confirms the above findings. IMPRESSION: No definite evidence of pulmonary embolism. Scattered atherosclerotic disease changes and pacemaker. Minimal LEFT basilar atelectasis without pulmonary infiltrate. Aortic Atherosclerosis (ICD10-I70.0). Electronically Signed   By: Lavonia Dana M.D.   On: 01/12/2019 12:17   Dg Chest Portable 1 View  Result Date: 01/12/2019 CLINICAL DATA:  Cough.  Dizziness. EXAM: PORTABLE CHEST 1 VIEW COMPARISON:  One-view chest x-ray 10/23/2017 FINDINGS: The heart size is normal. Atherosclerotic calcifications are present at the aortic arch. Pacing wires are in place. Lung volumes are low. No focal airspace disease is present. There is no edema or effusion. IMPRESSION: 1. Low lung volumes. 2. Cardiac atherosclerosis. 3. No acute cardiopulmonary disease. Electronically Signed   By: San Morelle M.D.   On: 01/12/2019 10:28   US Abdomen Limited Ruq  Result Date: 01/12/2019 CLINICAL DATA:  Elevated liver function studies including alkaline phosphatase. Cough and dehydration. EXAM: ULTRASOUND ABDOMEN  LIMITED RIGHT UPPER QUADRANT COMPARISON:  Chest CTA same date. FINDINGS: Gallbladder: No gallstones or wall thickening visualized. No sonographic Murphy sign noted by sonographer. Common bile duct: Diameter: 3 mm Liver: The liver is diffusely heterogeneous in echotexture. There are multiple predominately hypoechoic liver lesions. The largest of these measure up to 2.6 x 1.9 x 2.8 cm in the right lobe and up to 3.7 x 2.6 x 4.5 cm in the left lobe. These are not well visualized on the chest CTA done earlier. Portal vein is patent on color Doppler imaging with normal direction of blood flow towards the liver. IMPRESSION: 1. The gallbladder and biliary system appear normal. 2. Multiple hypoechoic liver lesions are worrisome for metastatic disease. Further evaluation recommended, ideally  with MRI without and with contrast. Since the patient IV contrast for a chest CTA earlier today, if evaluated by CT, that should be delayed by approximately 24 hours. Electronically Signed   By: Richardean Sale M.D.   On: 01/12/2019 13:45      Impression / Plan:   Assessment: Active Problems:   Hypotension   Jose Dunlap is a 68 y.o. y/o male with abnormal liver enzymes that are not consistent with any obstructive pattern but more consistent with an infiltrative disease such as was seen on the ultrasound.  The patient had a colonoscopy in the past with polyps found and a request to have that repeated off of his anticoagulation that he was on at the time of the colonoscopy back in 2016.  It does not appear that the patient had a repeat colonoscopy to remove the polyps.  Plan:  The patient was ordered an MRI for evaluation of the liver lesions but has a pacemaker and the MRI could not be done.  The patient will have his lab sent off for CEA, AFP and CA 19-9.  There is also a pending consult from hematology/oncology.  The patient will have his work-up guided by the results of his tumor markers.  The patient may need a  liver biopsy if a primary source is not found.  The patient will also need a colonoscopy at some time to remove the polyp seen at the time of the last colonoscopy and to rule out any metastatic disease from the colon as the cause of the liver lesions.  I have discussed the plan with the patient and the hospitalist.  Thank you for involving me in the care of this patient.      LOS: 1 day   Lucilla Lame, MD  01/13/2019, 7:35 PM    Note: This dictation was prepared with Dragon dictation along with smaller phrase technology. Any transcriptional errors that result from this process are unintentional.

## 2019-01-14 ENCOUNTER — Inpatient Hospital Stay: Payer: PPO

## 2019-01-14 DIAGNOSIS — K769 Liver disease, unspecified: Secondary | ICD-10-CM

## 2019-01-14 LAB — CBC
HCT: 31.5 % — ABNORMAL LOW (ref 39.0–52.0)
Hemoglobin: 10.4 g/dL — ABNORMAL LOW (ref 13.0–17.0)
MCH: 26.1 pg (ref 26.0–34.0)
MCHC: 33 g/dL (ref 30.0–36.0)
MCV: 79.1 fL — ABNORMAL LOW (ref 80.0–100.0)
Platelets: 326 10*3/uL (ref 150–400)
RBC: 3.98 MIL/uL — ABNORMAL LOW (ref 4.22–5.81)
RDW: 14.6 % (ref 11.5–15.5)
WBC: 20.3 10*3/uL — ABNORMAL HIGH (ref 4.0–10.5)
nRBC: 0 % (ref 0.0–0.2)

## 2019-01-14 LAB — BASIC METABOLIC PANEL
Anion gap: 13 (ref 5–15)
BUN: 10 mg/dL (ref 8–23)
CO2: 22 mmol/L (ref 22–32)
Calcium: 8.6 mg/dL — ABNORMAL LOW (ref 8.9–10.3)
Chloride: 99 mmol/L (ref 98–111)
Creatinine, Ser: 0.88 mg/dL (ref 0.61–1.24)
GFR calc Af Amer: >60 mL/min (ref >60)
GFR calc non Af Amer: >60 mL/min (ref >60)
Glucose, Bld: 119 mg/dL — ABNORMAL HIGH (ref 70–99)
Potassium: 4.3 mmol/L (ref 3.5–5.1)
Sodium: 134 mmol/L — ABNORMAL LOW (ref 135–145)

## 2019-01-14 LAB — ECHOCARDIOGRAM COMPLETE
Height: 72 in
Weight: 3590.4 oz

## 2019-01-14 LAB — GLUCOSE, CAPILLARY
Glucose-Capillary: 107 mg/dL — ABNORMAL HIGH (ref 70–99)
Glucose-Capillary: 97 mg/dL (ref 70–99)

## 2019-01-14 LAB — HIV ANTIBODY (ROUTINE TESTING W REFLEX): HIV Screen 4th Generation wRfx: NONREACTIVE

## 2019-01-14 LAB — AFP TUMOR MARKER: AFP, Serum, Tumor Marker: 3.6 ng/mL (ref 0.0–8.3)

## 2019-01-14 LAB — CANCER ANTIGEN 19-9: CA 19-9: 8 U/mL (ref 0–35)

## 2019-01-14 LAB — CEA: CEA: 7.1 ng/mL — ABNORMAL HIGH (ref 0.0–4.7)

## 2019-01-14 MED ORDER — ASPIRIN EC 81 MG PO TBEC: 81.0000 mg | DELAYED_RELEASE_TABLET | Freq: Every day | ORAL | Status: DC

## 2019-01-14 MED ORDER — CLOPIDOGREL BISULFATE 75 MG PO TABS: 75.0000 mg | ORAL_TABLET | Freq: Every day | ORAL | Status: DC

## 2019-01-14 MED ORDER — IOHEXOL 300 MG/ML  SOLN
100.0000 mL | Freq: Once | INTRAMUSCULAR | Status: AC | PRN
  Administered 2019-01-14: 100 mL via INTRAVENOUS

## 2019-01-14 MED ORDER — NEBIVOLOL HCL 5 MG PO TABS
5.0000 mg | ORAL_TABLET | Freq: Every day | ORAL | Status: DC
  Filled 2019-01-14: qty 1

## 2019-01-14 MED ORDER — CYANOCOBALAMIN 500 MCG PO TABS
250.0000 ug | ORAL_TABLET | Freq: Every day | ORAL | Status: DC
  Filled 2019-01-14: qty 1

## 2019-01-14 MED ORDER — IPRATROPIUM-ALBUTEROL 0.5-2.5 (3) MG/3ML IN SOLN: 3.0000 mL | Freq: Three times a day (TID) | RESPIRATORY_TRACT | Status: DC

## 2019-01-14 MED ORDER — IOHEXOL 240 MG/ML SOLN
25.0000 mL | INTRAMUSCULAR | Status: AC
  Administered 2019-01-14: 25 mL via ORAL

## 2019-01-14 NOTE — Progress Notes (Signed)
[facility] [place] NAME: Jose Dunlap    MR#:  [id number]  DATE OF BIRTH:  1951-02-01  SUBJECTIVE:  CHIEF COMPLAINT:   Chief Complaint  Patient presents with  . Cough  . Dizziness   Patient reports that he is feeling better today.  He is asking why he has not been given his Plavix or aspirin while admitted to the hospital.  He is also hopeful that he will be able to go home today.  He voices understanding that he will need to follow-up with his primary care provider, gastroenterology and oncology after his discharge from the hospital.  He is also okay with having his CT abdomen performed today prior to going home as he would like to find out what is causing the metastases on his liver.  REVIEW OF SYSTEMS:  Review of Systems  Constitutional: Negative for malaise/fatigue.  Respiratory: Negative for shortness of breath.   Cardiovascular: Negative for chest pain.  Gastrointestinal: Negative for abdominal pain.  Neurological: Negative for dizziness and weakness.    DRUG ALLERGIES:  No Known Allergies VITALS:  Blood pressure 132/84, pulse 86, temperature 98.8 F (37.1 C), temperature source Oral, resp. rate 16, height 6' (1.829 m), weight 101.8 kg, SpO2 96 %. PHYSICAL EXAMINATION:  Physical Exam Vitals signs reviewed.  Constitutional:      Appearance: Normal appearance.  HENT:     Head: Normocephalic and atraumatic.     Nose: Nose normal.  Pulmonary:     Effort: Pulmonary effort is normal.  Musculoskeletal: Normal range of motion.  Neurological:     General: No focal deficit present.     Mental Status: He is alert and oriented to person, place, and time.  Psychiatric:        Mood and Affect: Mood normal.        Behavior: Behavior normal.    LABORATORY PANEL:  Male CBC Recent Labs  Lab 01/14/19 0557  WBC 20.3*  HGB 10.4*  HCT 31.5*  PLT 326    ------------------------------------------------------------------------------------------------------------------ Chemistries  Recent Labs  Lab 01/12/19 1651  01/14/19 0557  NA 136   < > 134*  K 5.1   < > 4.3  CL 100   < > 99  CO2 24   < > 22  GLUCOSE 106*   < > 119*  BUN 14   < > 10  CREATININE 0.86   < > 0.88  CALCIUM 8.9   < > 8.6*  AST 45*  --   --   ALT 69*  --   --   ALKPHOS 524*  --   --   BILITOT 1.1  --   --    < > = values in this interval not displayed.   RADIOLOGY:  No results found. ASSESSMENT AND PLAN:  68 year old male with multiple medical problems of essential hypertension, ischemic cardiomyopathy with EF of 30% follows up with Dr. Ubaldo Glassing, history of pacemaker, defibrillator who presented with generalized weakness, dizziness and found to be hypotensive.  1. Hypotension, resolved-likely 2/2 dehydration given improvement with IVF. - restart bystolic; continue holding Aldactone, lisinopril given that patient will have another CT with contrast today and blood pressures remain softer.  Patient will need to follow-up with primary care physician in order to restart his medications as indicated.  2. Ischemic cardiomyopathy with h/o ICD, PPM placement,  CAD s/p MI with stent placement RCA, EF 30%  - repeat ECHO read pending - Restart Bystolic;  continue holding ACE and aldactone as above - continue ASA 81 and will restart Plavix 75mg  daily for DAPT if oncology does not wish to proceed with biopsy  3.  Hypoechoic liver lesions concerning for metastasis-noted on RUQ ultrasound on 7/6 -Will obtain CT abd with contrast today for further evaluation, patient unable to have MRI at this time due to ICD -Oncology consulted, appreciate recommendations-Dr. Grayland Ormond will see this afternoon -GI consulted-recommending outpatient colonoscopy for further evaluation -AFP wnl at 3.6, CA 19-9 wnl at 8, CEA elevated to 7.1  4. Acute bronchitis- s/p prednisone, z-pak - COVID 19 neg x4  - Consider d/c of ACE for ARB if cough continues  All the records are reviewed and case discussed with Care Management/Social Worker. Management plans discussed with the patient, family and they are in agreement.  CODE STATUS: Full Code  Plan to DC today.  [place] Lainy Wrobleski, DO PGY-3, [facility]  01/14/2019 at 7:12 AM  Between 7am to 6pm - Pager - [phone]  After 6pm go to [email or web address] - Proofreader  [facility]  Office  [phone]  CC: Primary care physician; Olin Hauser, DO

## 2019-01-14 NOTE — Consult Note (Signed)
[facility]  Telephone:([phone]) [phone] Fax:(336) [phone]  ID: Jose Dunlap OB: April 09, 1951  MR#: [id number]  AOZ#:[id number]  Patient Care Team: Olin Hauser, DO as PCP - General (Family Medicine)  CHIEF COMPLAINT: Liver lesions, concerning for metastasis.  INTERVAL HISTORY: Patient is a 68 year old male who initially presented to the emergency room with persistent cough.  CT of his chest was negative, but ultrasound of his abdomen revealed multiple lesions in his liver suspicious for metastatic disease.  He currently feels well and is nearly back to his baseline.  He has no neurologic complaints.  He denies any recent fevers or illnesses.  He has a good appetite and denies weight loss.  He has no chest pain, shortness of breath, or hemoptysis.  He denies any abdominal pain.  He has no nausea, vomiting, constipation, or diarrhea.  He denies any melena or hematochezia.  He has no urinary complaints.  Patient otherwise feels well and offers no further specific complaints today.  REVIEW OF SYSTEMS:   Review of Systems  Constitutional: Negative.  Negative for fever, malaise/fatigue and weight loss.  Respiratory: Positive for cough. Negative for hemoptysis and shortness of breath.   Cardiovascular: Negative.  Negative for chest pain and leg swelling.  Gastrointestinal: Negative.  Negative for abdominal pain, blood in stool, constipation, diarrhea, melena, nausea and vomiting.  Genitourinary: Negative.  Negative for dysuria.  Musculoskeletal: Negative.  Negative for back pain.  Skin: Negative.  Negative for rash.  Neurological: Negative.  Negative for dizziness, focal weakness, weakness and headaches.  Psychiatric/Behavioral: Negative.  The patient is not nervous/anxious.     As per HPI. Otherwise, a complete review of systems is negative.  PAST MEDICAL HISTORY: Past Medical History:  Diagnosis Date   Essential hypertension    Hyperlipidemia    MI  (myocardial infarction) ([place])    Obesity    Pacemaker    Shortness of breath dyspnea     PAST SURGICAL HISTORY: Past Surgical History:  Procedure Laterality Date   ABDOMINAL HERNIA REPAIR  2001   BACK SURGERY  1986   COLONOSCOPY WITH PROPOFOL N/A 05/19/2015   Procedure: COLONOSCOPY WITH PROPOFOL;  Surgeon: Hulen Luster, MD;  Location: [facility] ENDOSCOPY;  Service: Gastroenterology;  Laterality: N/A;   CORONARY ANGIOPLASTY     INSERT / REPLACE / REMOVE PACEMAKER     KNEE ARTHROSCOPY     PACEMAKER PLACEMENT     PROSTATE SURGERY     TONSILLECTOMY      FAMILY HISTORY: History reviewed. No pertinent family history.  ADVANCED DIRECTIVES (Y/N):  @ADVDIR @  HEALTH MAINTENANCE: Social History   Tobacco Use   Smoking status: Former Smoker   Smokeless tobacco: Former Network engineer Use Topics   Alcohol use: Yes   Drug use: No     Colonoscopy:  PAP:  Bone density:  Lipid panel:  No Known Allergies  Current Facility-Administered Medications  Medication Dose Route Frequency Provider Last Rate Last Dose   0.9 %  sodium chloride infusion   Intravenous Continuous Epifanio Lesches, MD 100 mL/hr at 01/14/19 9629     acetaminophen (TYLENOL) tablet 650 mg  650 mg Oral Q6H PRN Epifanio Lesches, MD       Or   acetaminophen (TYLENOL) suppository 650 mg  650 mg Rectal Q6H PRN Epifanio Lesches, MD       aspirin EC tablet 81 mg  81 mg Oral Daily Epifanio Lesches, MD       chlorpheniramine-HYDROcodone (TUSSIONEX) 10-8 MG/5ML suspension 5  mL  5 mL Oral BID Epifanio Lesches, MD   5 mL at 01/13/19 2147   docusate sodium (COLACE) capsule 100 mg  100 mg Oral BID Epifanio Lesches, MD   100 mg at 01/13/19 2147   enoxaparin (LOVENOX) injection 40 mg  40 mg Subcutaneous Q24H Epifanio Lesches, MD   40 mg at 01/13/19 2147   feeding supplement (ENSURE ENLIVE) (ENSURE ENLIVE) liquid 237 mL  237 mL Oral BID BM Mayo, Pete Pelt, MD   237 mL at 01/13/19 1517    ipratropium-albuterol (DUONEB) 0.5-2.5 (3) MG/3ML nebulizer solution 3 mL  3 mL Nebulization TID Mayo, Pete Pelt, MD       multivitamin with minerals tablet 1 tablet  1 tablet Oral Daily Mayo, Pete Pelt, MD       nebivolol (BYSTOLIC) tablet 5 mg  5 mg Oral Daily Enid Derry, [place], DO       ondansetron [facility]) tablet 4 mg  4 mg Oral Q6H PRN Epifanio Lesches, MD       Or   ondansetron (ZOFRAN) injection 4 mg  4 mg Intravenous Q6H PRN Epifanio Lesches, MD       vitamin B-12 (CYANOCOBALAMIN) tablet 250 mcg  250 mcg Oral Daily Epifanio Lesches, MD        OBJECTIVE: Vitals:   01/14/19 0504 01/14/19 0749  BP: 132/84 128/82  Pulse: 86 79  Resp: 16 19  Temp: 98.8 F (37.1 C) (!) 97.4 F (36.3 C)  SpO2: 96% 94%     Body mass index is 30.43 kg/m.    ECOG FS:0 - Asymptomatic  General: Well-developed, well-nourished, no acute distress. Eyes: Pink conjunctiva, anicteric sclera. HEENT: Normocephalic, moist mucous membranes, clear oropharnyx. Lungs: Clear to auscultation bilaterally. Heart: Regular rate and rhythm. No rubs, murmurs, or gallops. Abdomen: Soft, nontender, nondistended. No organomegaly noted, normoactive bowel sounds. Musculoskeletal: No edema, cyanosis, or clubbing. Neuro: Alert, answering all questions appropriately. Cranial nerves grossly intact. Skin: No rashes or petechiae noted. Psych: Normal affect. Lymphatics: No cervical, calvicular, axillary or inguinal LAD.   LAB RESULTS:  Lab Results  Component Value Date   NA 134 (L) 01/14/2019   K 4.3 01/14/2019   CL 99 01/14/2019   CO2 22 01/14/2019   GLUCOSE 119 (H) 01/14/2019   BUN 10 01/14/2019   CREATININE 0.88 01/14/2019   CALCIUM 8.6 (L) 01/14/2019   PROT 7.6 01/12/2019   ALBUMIN 3.1 (L) 01/12/2019   AST 45 (H) 01/12/2019   ALT 69 (H) 01/12/2019   ALKPHOS 524 (H) 01/12/2019   BILITOT 1.1 01/12/2019   GFRNONAA >60 01/14/2019   GFRAA >60 01/14/2019    Lab Results  Component Value Date   WBC  20.3 (H) 01/14/2019   NEUTROABS 4,661 08/06/2017   HGB 10.4 (L) 01/14/2019   HCT 31.5 (L) 01/14/2019   MCV 79.1 (L) 01/14/2019   PLT 326 01/14/2019     STUDIES: Ct Angio Chest Pe W And/or Wo Contrast  Result Date: 01/12/2019 CLINICAL DATA:  Cough for couple months, dizziness for few weeks, weakness, history pacemaker, coronary artery disease post angioplasty and MI, essential hypertension, hyperlipidemia, obesity, former smoker EXAM: CT ANGIOGRAPHY CHEST WITH CONTRAST TECHNIQUE: Multidetector CT imaging of the chest was performed using the standard protocol during bolus administration of intravenous contrast. Multiplanar CT image reconstructions and MIPs were obtained to evaluate the vascular anatomy. CONTRAST:  15mL OMNIPAQUE IOHEXOL 350 MG/ML SOLN IV COMPARISON:  None FINDINGS: Cardiovascular: Atherosclerotic calcifications aorta, proximal great vessels and coronary arteries. Ascending thoracic aorta upper  normal caliber without aneurysm or dissection. Pacemaker leads via LEFT subclavian approach project at RIGHT atrium, RIGHT ventricle, and coronary sinus. No pericardial effusion. Pulmonary arteries adequately opacified. Beam hardening artifacts from pacemaker leads and contrast in the SVC traverse the pulmonary arteries. No definite pulmonary emboli identified. Mediastinum/Nodes: Esophagus unremarkable. Base of cervical region normal appearance. No thoracic adenopathy. Minimal gynecomastia bilaterally. Lungs/Pleura: Minimal atelectasis at LEFT base. Lungs otherwise clear. No pulmonary infiltrate, pleural effusion, or pneumothorax. Upper Abdomen: Visualized upper abdomen unremarkable Musculoskeletal: No acute osseous findings. Review of the MIP images confirms the above findings. IMPRESSION: No definite evidence of pulmonary embolism. Scattered atherosclerotic disease changes and pacemaker. Minimal LEFT basilar atelectasis without pulmonary infiltrate. Aortic Atherosclerosis (ICD10-I70.0).  Electronically Signed   By: Lavonia Dana M.D.   On: 01/12/2019 12:17   Ct Abdomen Pelvis W Contrast  Result Date: 01/14/2019 CLINICAL DATA:  Liver lesion EXAM: CT ABDOMEN AND PELVIS WITH CONTRAST TECHNIQUE: Multidetector CT imaging of the abdomen and pelvis was performed using the standard protocol following bolus administration of intravenous contrast. CONTRAST:  177mL OMNIPAQUE IOHEXOL 300 MG/ML  SOLN COMPARISON:  Liver ultrasound, 01/12/2019 FINDINGS: Lower chest: No acute abnormality. Extensive 3 vessel coronary artery calcifications and/or stents, and pacer leads. Hepatobiliary: Hepatomegaly, maximum coronal span 22.3 cm. There are innumerable, small hypodense lesions of liver parenchyma. There is ill-defined, somewhat masslike enlargement of the caudate lobe and adjacent segment IV (series 2, image 32), which in total measures approximately 8.2 x 6.0 cm (series 2, image 29). No gallstones, gallbladder wall thickening, or biliary dilatation. Pancreas: Unremarkable. No pancreatic ductal dilatation or surrounding inflammatory changes. Spleen: Normal in size without significant abnormality. Adrenals/Urinary Tract: Adrenal glands are unremarkable. Kidneys are normal, without renal calculi, solid lesion, or hydronephrosis. 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 Appendix appears normal. No evidence of bowel wall thickening, distention, or inflammatory changes. Vascular/Lymphatic: Aortic atherosclerosis. There are abnormally enlarged gastrohepatic, celiac axis, and left retroperitoneal nodes, largest nodes measuring approximately 1.8 cm (series 2, image 38). Reproductive: Prostatomegaly with TURP defect. Other: No abdominal wall hernia or abnormality. No abdominopelvic ascites. Musculoskeletal: No acute or significant osseous findings. IMPRESSION: 1. Hepatomegaly with innumerable, small hypodense lesions of  the liver parenchyma, most consistent with metastatic disease, as queried on prior ultrasound examination. 2. There is no definite primary mass noted within the abdomen or pelvis, however there is ill-defined, somewhat masslike enlargement of the caudate lobe and adjacent segment IV (series 2, image 32), which in total measures approximately 8.2 x 6.0 cm (series 2, image 29). This could represent a primary hepatocellular carcinoma. This lesion is ideally best characterized by multiphasic contrast enhanced MRI. 3. There are abnormally enlarged gastrohepatic, celiac axis, and left retroperitoneal nodes, largest nodes measuring approximately 1.8 cm (series 2, image 38), concerning for nodal metastatic disease. Electronically Signed   By: Eddie Candle M.D.   On: 01/14/2019 14:08   Dg Chest Portable 1 View  Result Date: 01/12/2019 CLINICAL DATA:  Cough.  Dizziness. EXAM: PORTABLE CHEST 1 VIEW COMPARISON:  One-view chest x-ray 10/23/2017 FINDINGS: The heart size is normal. Atherosclerotic calcifications are present at the aortic arch. Pacing wires are in place. Lung volumes are low. No focal airspace disease is present. There is no edema or effusion. IMPRESSION: 1. Low lung volumes. 2. Cardiac atherosclerosis. 3. No acute cardiopulmonary disease. Electronically Signed   By: San Morelle M.D.   On: 01/12/2019 10:28  US Abdomen Limited Ruq  Result Date: 01/12/2019 CLINICAL DATA:  Elevated liver function studies including alkaline phosphatase. Cough and dehydration. EXAM: ULTRASOUND ABDOMEN LIMITED RIGHT UPPER QUADRANT COMPARISON:  Chest CTA same date. FINDINGS: Gallbladder: No gallstones or wall thickening visualized. No sonographic Murphy sign noted by sonographer. Common bile duct: Diameter: 3 mm Liver: The liver is diffusely heterogeneous in echotexture. There are multiple predominately hypoechoic liver lesions. The largest of these measure up to 2.6 x 1.9 x 2.8 cm in the right lobe and up to 3.7 x 2.6 x  4.5 cm in the left lobe. These are not well visualized on the chest CTA done earlier. Portal vein is patent on color Doppler imaging with normal direction of blood flow towards the liver. IMPRESSION: 1. The gallbladder and biliary system appear normal. 2. Multiple hypoechoic liver lesions are worrisome for metastatic disease. Further evaluation recommended, ideally with MRI without and with contrast. Since the patient IV contrast for a chest CTA earlier today, if evaluated by CT, that should be delayed by approximately 24 hours. Electronically Signed   By: Richardean Sale M.D.   On: 01/12/2019 13:45    ASSESSMENT: Liver lesions, concerning for metastasis.  PLAN:    1.  Liver lesions concerning for metastasis: CT scan results from earlier today revealed innumerable hypodense lesions in the liver consistent with metastatic disease.  Patient also has a dominant mass measuring 8.2 cm concerning for primary hepatocellular carcinoma.  He also has significant lymphadenopathy in his abdomen.  CEA is only mildly elevated.  CA 19-9 and AFP are within normal limits.  Okay to discharge from an oncology standpoint.  Patient will require ultrasound-guided biopsy as an outpatient.  Will arrange follow-up in the cancer center within the next week for further evaluation and continued diagnostic work-up.  Appreciate consult, call with questions.  Lloyd Huger, MD   01/14/2019 2:11 PM

## 2019-01-14 NOTE — Discharge Summary (Signed)
[facility] NAME: Jose Dunlap    MR#:  [id number]  DATE OF BIRTH:  16-Sep-1950  DATE OF ADMISSION:  01/12/2019   ADMITTING PHYSICIAN: Epifanio Lesches, MD  DATE OF DISCHARGE: 01/14/19  PRIMARY CARE PHYSICIAN: Olin Hauser, DO   ADMISSION DIAGNOSIS:  Weakness [R53.1] Elevated LFTs [R94.5] Elevated alkaline phosphatase level [R74.8] DISCHARGE DIAGNOSIS:  Active Problems:   Hypotension   Elevated LFTs  SECONDARY DIAGNOSIS:   Past Medical History:  Diagnosis Date   Essential hypertension    Hyperlipidemia    MI (myocardial infarction) ([place])    Obesity    Pacemaker    Shortness of breath dyspnea    HOSPITAL COURSE:   History of presenting illness: Jose Dunlap is a 68 year old male with a known history of hypertension, hyperlipidemia, ischemic cardiomyopathy with EF of 30% and h/o ICD, CAD s/p MI with stent placement to RCA who presented secondary to generalized weakness, dizziness, and shortness of breath. Patient was having cough, shortness of breath and recently finished 2 rounds of antibiotics for his cough. COVID-19 test was negative x2 but patient continued to have a dry cough and was treated with azithromycin, prednisone and albuterol. Patient came to ER for persistent weakness, shortness of breath with dry cough.  CT chest in the emergency room essentially is negative. COVID-19 negative. Ultrasound of the gallbladder in ED showed no gallbladder pathology, but was concerning for metastatic disease in the liver.  Patient was admitted to telemetry.   Hospital course: 1. Hypotension: Resolved.  Believed to be secondary to dehydration given improvement with IV fluids.  Initially patient's antihypertensives were held.  Prior to discharge Bystolic was restarted at his home dose of 5 mg daily.  We continued holding patient's Aldactone and lisinopril given that patient received multiple CT's with contrast.  Patient will need to restart his home medications aldactone, lasix, and lisinopril as tolerated.  2. Ischemic cardiomyopathy with h/o ICD, PPM placement,   CAD s/p MI with stent placement RCA, EF 30%: Patient had repeat echo performed while he was admitted in the hospital.  We continued his aspirin and plavix.  3.Hypoechoic liver lesions concerning for metastasis-noted on RUQ ultrasound on 7/6 .MRI with and without contrast of the abdomen and pelvis are recommended for further evaluation of liver metastases.  However patient's ICD did not allow for [place] to obtain this while he was inpatient, he may have this done outpatient.  Did obtain CT abd with contrast further evaluation, which showed innumerable small lesions of the liver and a mass-like enlargement of the caudate lobe of the liver, which could represent a primary hepatocellular carcinoma.  Oncologist, Dr. Grayland Ormond saw patient prior to discharge and recommended outpatient liver biopsy.  Gastroenterology recommended that patient have outpatient colonoscopy for further evaluation.  They also recommended obtaining labs which showed: AFP wnl at 3.6, CA 19-9 wnl at 8, CEA elevated to 7.1.  4. Acute bronchitis- s/p prednisone, z-pak. COVID 19 neg x4.  Patient continued to have cough while admitted however shortness of breath did improve.  Consider  discontinuing ACE for ARB if cough continues.  DISCHARGE CONDITIONS:  Hemic cardiomyopathy CAD s/p MI with stent Chronic systolic congestive heart failure Liver lesions Acute bronchitis CONSULTS OBTAINED:  Treatment Team:  Lucilla Lame, MD DRUG ALLERGIES:  No Known Allergies DISCHARGE MEDICATIONS:   Allergies as of 01/14/2019   No Known Allergies     Medication List    STOP taking these  medications   azithromycin 250 MG tablet Commonly known as: Zithromax Z-Pak   furosemide 20 MG tablet Commonly known as: LASIX   lisinopril 30 MG tablet Commonly known as: ZESTRIL   predniSONE 50 MG  tablet Commonly known as: DELTASONE   spironolactone 25 MG tablet Commonly known as: ALDACTONE     TAKE these medications   albuterol 108 (90 Base) MCG/ACT inhaler Commonly known as: VENTOLIN HFA Inhale 2 puffs into the lungs every 4 (four) hours as needed for wheezing or shortness of breath (cough).   aspirin 81 MG tablet Chew 81 mg by mouth daily.   clopidogrel 75 MG tablet Commonly known as: PLAVIX Take 1 tablet (75 mg total) by mouth daily.   D 1000 25 MCG (1000 UT) capsule Generic drug: Cholecalciferol Take 1 capsule by mouth daily.   FISH OIL PO Take 1 capsule by mouth 2 (two) times daily.   fluticasone 50 MCG/ACT nasal spray Commonly known as: FLONASE Place 2 sprays into both nostrils daily.   Icosapent Ethyl 1 g Caps Commonly known as: Vascepa Take 2 g by mouth 2 (two) times daily.   multivitamin capsule Take 1 capsule by mouth daily.   nebivolol 5 MG tablet Commonly known as: Bystolic Take 1 tablet (5 mg total) by mouth daily.   nitroGLYCERIN 0.4 MG SL tablet Commonly known as: NITROSTAT Place 1 tablet (0.4 mg total) under the tongue as needed.   Slow Magnesium/Calcium 70-117 MG Tbec Generic drug: Magnesium Cl-Calcium Carbonate Take 1 tablet by mouth daily.   vitamin B-12 250 MCG tablet Commonly known as: CYANOCOBALAMIN Take 1 tablet by mouth daily.       DISCHARGE INSTRUCTIONS:  1.  Follow-up with PCP in 5 days 2.  Follow-up with oncology in 1 week for liver biopsy 3.  Stop lisinopril, Lasix, spironolactone-may be restarted as an outpatient as needed DIET:  Cardiac diet DISCHARGE CONDITION:  Stable ACTIVITY:  Activity as tolerated OXYGEN:  Home Oxygen: No.  Oxygen Delivery: n/a DISCHARGE LOCATION:  home   If you experience worsening of your admission symptoms, develop shortness of breath, life threatening emergency, suicidal or homicidal thoughts you must seek medical attention immediately by calling 911 or calling your MD immediately   if symptoms less severe.  You Must read complete instructions/literature along with all the possible adverse reactions/side effects for all the Medicines you take and that have been prescribed to you. Take any new Medicines after you have completely understood and accpet all the possible adverse reactions/side effects.   Please note  You were cared for by a hospitalist during your hospital stay. If you have any questions about your discharge medications or the care you received while you were in the hospital after you are discharged, you can call the unit and asked to speak with the hospitalist on call if the hospitalist that took care of you is not available. Once you are discharged, your primary care physician will handle any further medical issues. Please note that NO REFILLS for any discharge medications will be authorized once you are discharged, as it is imperative that you return to your primary care physician (or establish a relationship with a primary care physician if you do not have one) for your aftercare needs so that they can reassess your need for medications and monitor your lab values.    On the day of Discharge:  VITAL SIGNS:  Blood pressure 128/82, pulse 79, temperature (!) 97.4 F (36.3 C), temperature source Oral, resp. rate 19,  height 6' (1.829 m), weight 101.8 kg, SpO2 94 %. PHYSICAL EXAMINATION:  GENERAL:  68 y.o.-year-old patient lying in the bed with no acute distress.  EYES: Pupils equal, round, reactive to light and accommodation. No scleral icterus. Extraocular muscles intact.  HEENT: Head atraumatic, normocephalic. Oropharynx and nasopharynx clear.  NECK:  Supple, no jugular venous distention. No thyroid enlargement, no tenderness.  LUNGS: Normal breath sounds bilaterally, no wheezing, rales,rhonchi or crepitation. No use of accessory muscles of respiration.  CARDIOVASCULAR: RRR, S1, S2 normal. No murmurs, rubs, or gallops.  ABDOMEN: Soft, non-tender, non-distended.  Bowel sounds present. No organomegaly or mass.  EXTREMITIES: No pedal edema, cyanosis, or clubbing.  NEUROLOGIC: Cranial nerves II through XII are intact. Muscle strength 5/5 in all extremities. Sensation intact. Gait not checked.  PSYCHIATRIC: The patient is alert and oriented x 3.  SKIN: No obvious rash, lesion, or ulcer.  DATA REVIEW:   CBC Recent Labs  Lab 01/14/19 0557  WBC 20.3*  HGB 10.4*  HCT 31.5*  PLT 326    Chemistries  Recent Labs  Lab 01/12/19 1651  01/14/19 0557  NA 136   < > 134*  K 5.1   < > 4.3  CL 100   < > 99  CO2 24   < > 22  GLUCOSE 106*   < > 119*  BUN 14   < > 10  CREATININE 0.86   < > 0.88  CALCIUM 8.9   < > 8.6*  AST 45*  --   --   ALT 69*  --   --   ALKPHOS 524*  --   --   BILITOT 1.1  --   --    < > = values in this interval not displayed.     Microbiology Results  Results for orders placed or performed during the hospital encounter of 01/12/19  SARS Coronavirus 2 (CEPHEID - Performed in [facility] [facility] hospital lab), Hosp Order     Status: None   Collection Time: 01/12/19 10:16 AM   Specimen: Nasopharyngeal Swab  Result Value Ref Range Status   SARS Coronavirus 2 NEGATIVE NEGATIVE Final    Comment: (NOTE) If result is NEGATIVE SARS-CoV-2 target nucleic acids are NOT DETECTED. The SARS-CoV-2 RNA is generally detectable in upper and lower  respiratory specimens during the acute phase of infection. The lowest  concentration of SARS-CoV-2 viral copies this assay can detect is 250  copies / mL. A negative result does not preclude SARS-CoV-2 infection  and should not be used as the sole basis for treatment or other  patient management decisions.  A negative result may occur with  improper specimen collection / handling, submission of specimen other  than nasopharyngeal swab, presence of viral mutation(s) within the  areas targeted by this assay, and inadequate number of viral copies  (<250 copies / mL). A negative result must be combined with  clinical  observations, patient history, and epidemiological information. If result is POSITIVE SARS-CoV-2 target nucleic acids are DETECTED. The SARS-CoV-2 RNA is generally detectable in upper and lower  respiratory specimens dur ing the acute phase of infection.  Positive  results are indicative of active infection with SARS-CoV-2.  Clinical  correlation with patient history and other diagnostic information is  necessary to determine patient infection status.  Positive results do  not rule out bacterial infection or co-infection with other viruses. If result is PRESUMPTIVE POSTIVE SARS-CoV-2 nucleic acids MAY BE PRESENT.   A presumptive positive result was obtained on  the submitted specimen  and confirmed on repeat testing.  While 2019 novel coronavirus  (SARS-CoV-2) nucleic acids may be present in the submitted sample  additional confirmatory testing may be necessary for epidemiological  and / or clinical management purposes  to differentiate between  SARS-CoV-2 and other Sarbecovirus currently known to infect humans.  If clinically indicated additional testing with an alternate test  methodology [phone]) is advised. The SARS-CoV-2 RNA is generally  detectable in upper and lower respiratory sp ecimens during the acute  phase of infection. The expected result is Negative. Fact Sheet for Patients:  StrictlyIdeas.no Fact Sheet for Healthcare Providers: BankingDealers.co.za This test is not yet approved or cleared by the [place] FDA and has been authorized for detection and/or diagnosis of SARS-CoV-2 by FDA under an Emergency Use Authorization (EUA).  This EUA will remain in effect (meaning this test can be used) for the duration of the COVID-19 declaration under Section 564(b)(1) of the Act, 21 U.S.C. section 360bbb-3(b)(1), unless the authorization is terminated or revoked sooner. Performed at [facility], [address] [place], [place] [address]     RADIOLOGY:  Ct Angio Chest Pe W And/or Wo Contrast  Result Date: 01/12/2019 CLINICAL DATA:  Cough for couple months, dizziness for few weeks, weakness, history pacemaker, coronary artery disease post angioplasty and MI, essential hypertension, hyperlipidemia, obesity, former smoker EXAM: CT ANGIOGRAPHY CHEST WITH CONTRAST TECHNIQUE: Multidetector CT imaging of the chest was performed using the standard protocol during bolus administration of intravenous contrast. Multiplanar CT image reconstructions and MIPs were obtained to evaluate the vascular anatomy. CONTRAST:  24mL OMNIPAQUE IOHEXOL 350 MG/ML SOLN IV COMPARISON:  None FINDINGS: Cardiovascular: Atherosclerotic calcifications aorta, proximal great vessels and coronary arteries. Ascending thoracic aorta upper normal caliber without aneurysm or dissection. Pacemaker leads via LEFT subclavian approach project at RIGHT atrium, RIGHT ventricle, and coronary sinus. No pericardial effusion. Pulmonary arteries adequately opacified. Beam hardening artifacts from pacemaker leads and contrast in the SVC traverse the pulmonary arteries. No definite pulmonary emboli identified. Mediastinum/Nodes: Esophagus unremarkable. Base of cervical region normal appearance. No thoracic adenopathy. Minimal gynecomastia bilaterally. Lungs/Pleura: Minimal atelectasis at LEFT base. Lungs otherwise clear. No pulmonary infiltrate, pleural effusion, or pneumothorax. Upper Abdomen: Visualized upper abdomen unremarkable Musculoskeletal: No acute osseous findings. Review of the MIP images confirms the above findings. IMPRESSION: No definite evidence of pulmonary embolism. Scattered atherosclerotic disease changes and pacemaker. Minimal LEFT basilar atelectasis without pulmonary infiltrate. Aortic Atherosclerosis (ICD10-I70.0). Electronically Signed   By: Lavonia Dana M.D.   On: 01/12/2019 12:17   Dg Chest Portable 1 View  Result Date:  01/12/2019 CLINICAL DATA:  Cough.  Dizziness. EXAM: PORTABLE CHEST 1 VIEW COMPARISON:  One-view chest x-ray 10/23/2017 FINDINGS: The heart size is normal. Atherosclerotic calcifications are present at the aortic arch. Pacing wires are in place. Lung volumes are low. No focal airspace disease is present. There is no edema or effusion. IMPRESSION: 1. Low lung volumes. 2. Cardiac atherosclerosis. 3. No acute cardiopulmonary disease. Electronically Signed   By: San Morelle M.D.   On: 01/12/2019 10:28   US Abdomen Limited Ruq  Result Date: 01/12/2019 CLINICAL DATA:  Elevated liver function studies including alkaline phosphatase. Cough and dehydration. EXAM: ULTRASOUND ABDOMEN LIMITED RIGHT UPPER QUADRANT COMPARISON:  Chest CTA same date. FINDINGS: Gallbladder: No gallstones or wall thickening visualized. No sonographic Murphy sign noted by sonographer. Common bile duct: Diameter: 3 mm Liver: The liver is diffusely heterogeneous in echotexture. There are multiple predominately hypoechoic liver lesions. The  largest of these measure up to 2.6 x 1.9 x 2.8 cm in the right lobe and up to 3.7 x 2.6 x 4.5 cm in the left lobe. These are not well visualized on the chest CTA done earlier. Portal vein is patent on color Doppler imaging with normal direction of blood flow towards the liver. IMPRESSION: 1. The gallbladder and biliary system appear normal. 2. Multiple hypoechoic liver lesions are worrisome for metastatic disease. Further evaluation recommended, ideally with MRI without and with contrast. Since the patient IV contrast for a chest CTA earlier today, if evaluated by CT, that should be delayed by approximately 24 hours. Electronically Signed   By: Richardean Sale M.D.   On: 01/12/2019 13:45   Management plans discussed with the patient, family and they are in agreement.  CODE STATUS: Full Code   Martinique Shirley, DO PGY-3, [facility]  01/14/2019 at 9:54 AM  [facility]  Office  [phone]  CC: Primary care physician; Olin Hauser, DO

## 2019-01-14 NOTE — Progress Notes (Signed)
Plan for CT abdomen today; per CT, pt NPO for next 2 hours, and then will be given oral contrast. Pt instructed re NPO, and verbalized understanding.

## 2019-01-14 NOTE — Progress Notes (Signed)
Pt transported to DC via [facility] accompanied by nurse tech, in NAD. Pt verbalized understanding of all instructions given.

## 2019-01-15 ENCOUNTER — Telehealth: Payer: Self-pay

## 2019-01-15 NOTE — Telephone Encounter (Signed)
Transition Care Management Follow-up Telephone Call  Date of discharge and from where: 01/14/2019 [facility]   How have you been since you were released from the hospital? "Im just tired, ut doing okay"  Any questions or concerns? No   Items Reviewed:  Did the pt receive and understand the discharge instructions provided? No   Medications obtained and verified? Yes   Any new allergies since your discharge? No   Dietary orders reviewed? Yes  Do you have support at home? Lives by himself   Functional Questionnaire: (I = Independent and D = Dependent) ADLs: i  Bathing/Dressing- i  Meal Prep- i  Eating- i  Maintaining continence- i  Transferring/Ambulation- i  Managing Meds- i  Follow up appointments reviewed:   PCP Hospital f/u appt confirmed? Yes  Scheduled to see Dr.Karamalegos on 01/20/2019 @ 8:40am via [facility] f/u appt confirmed? Yes  Scheduled to see Dr.Finnegan on 01/16/2019 @ 2:30pm  Are transportation arrangements needed? No   If their condition worsens, is the pt aware to call PCP or go to the Emergency Dept.? Yes  Was the patient provided with contact information for the PCP's office or ED? Yes  Was to pt encouraged to call back with questions or concerns? Yes

## 2019-01-16 ENCOUNTER — Other Ambulatory Visit: Payer: Self-pay

## 2019-01-16 ENCOUNTER — Inpatient Hospital Stay: Payer: PPO | Attending: Oncology | Admitting: Oncology

## 2019-01-16 ENCOUNTER — Encounter: Payer: Self-pay | Admitting: Oncology

## 2019-01-16 ENCOUNTER — Encounter: Payer: PPO | Admitting: Family Medicine

## 2019-01-16 VITALS — BP 140/98 | HR 80 | Temp 96.5°F | Ht 72.0 in | Wt 219.5 lb

## 2019-01-16 DIAGNOSIS — R05 Cough: Secondary | ICD-10-CM | POA: Diagnosis not present

## 2019-01-16 DIAGNOSIS — F419 Anxiety disorder, unspecified: Secondary | ICD-10-CM | POA: Diagnosis not present

## 2019-01-16 DIAGNOSIS — Z79899 Other long term (current) drug therapy: Secondary | ICD-10-CM | POA: Diagnosis not present

## 2019-01-16 DIAGNOSIS — I2510 Atherosclerotic heart disease of native coronary artery without angina pectoris: Secondary | ICD-10-CM

## 2019-01-16 DIAGNOSIS — N400 Enlarged prostate without lower urinary tract symptoms: Secondary | ICD-10-CM | POA: Diagnosis not present

## 2019-01-16 DIAGNOSIS — R160 Hepatomegaly, not elsewhere classified: Secondary | ICD-10-CM

## 2019-01-16 DIAGNOSIS — R634 Abnormal weight loss: Secondary | ICD-10-CM | POA: Insufficient documentation

## 2019-01-16 DIAGNOSIS — I252 Old myocardial infarction: Secondary | ICD-10-CM | POA: Diagnosis not present

## 2019-01-16 DIAGNOSIS — R5383 Other fatigue: Secondary | ICD-10-CM | POA: Insufficient documentation

## 2019-01-16 DIAGNOSIS — I10 Essential (primary) hypertension: Secondary | ICD-10-CM

## 2019-01-16 DIAGNOSIS — E785 Hyperlipidemia, unspecified: Secondary | ICD-10-CM | POA: Diagnosis not present

## 2019-01-16 DIAGNOSIS — R630 Anorexia: Secondary | ICD-10-CM | POA: Diagnosis not present

## 2019-01-16 DIAGNOSIS — R970 Elevated carcinoembryonic antigen [CEA]: Secondary | ICD-10-CM | POA: Diagnosis not present

## 2019-01-16 DIAGNOSIS — I700 Atherosclerosis of aorta: Secondary | ICD-10-CM | POA: Diagnosis not present

## 2019-01-16 DIAGNOSIS — D649 Anemia, unspecified: Secondary | ICD-10-CM | POA: Diagnosis not present

## 2019-01-16 DIAGNOSIS — K769 Liver disease, unspecified: Secondary | ICD-10-CM | POA: Insufficient documentation

## 2019-01-16 DIAGNOSIS — G4700 Insomnia, unspecified: Secondary | ICD-10-CM | POA: Insufficient documentation

## 2019-01-16 DIAGNOSIS — Z87891 Personal history of nicotine dependence: Secondary | ICD-10-CM | POA: Diagnosis not present

## 2019-01-16 DIAGNOSIS — N62 Hypertrophy of breast: Secondary | ICD-10-CM | POA: Diagnosis not present

## 2019-01-16 DIAGNOSIS — R590 Localized enlarged lymph nodes: Secondary | ICD-10-CM

## 2019-01-16 DIAGNOSIS — E860 Dehydration: Secondary | ICD-10-CM | POA: Diagnosis not present

## 2019-01-16 DIAGNOSIS — D72829 Elevated white blood cell count, unspecified: Secondary | ICD-10-CM | POA: Insufficient documentation

## 2019-01-16 NOTE — Progress Notes (Signed)
Patient is here today to establish care. Patient was in the hospital on 07//2020. Patient stated that he feels tired and with no energy at all. Patient stated that he has had fever, chills and SOB. Patient also stated that in the past 6 months he has lost 35 lbs due to loss of appetite. Patient denied nausea, vomiting or diarrhea.

## 2019-01-18 NOTE — Progress Notes (Signed)
[place]  [phone] Fax:([phone]) [phone]  ID: Jose Dunlap OB: 1950/12/19  MR#: [id number]  LPF#:[id number]  Patient Care Team: Olin Hauser, DO as PCP - General (Family Medicine)  CHIEF COMPLAINT: Innumerable liver lesions and retroperitoneal lymphadenopathy highly suspicious for malignancy.  INTERVAL HISTORY: Patient is a 68 year old male initially evaluated in the hospital who was found to have innumerable liver lesions on CT.  He initially presented with cough.  He has worsening weakness and fatigue and reports a 35 pound weight loss in the past 6 months.  He states he has a poor appetite.  He does not complain of pain.  He has no neurologic complaints.  He denies any recent fevers.  He has no chest pain, shortness of breath, or hemoptysis.  He denies any nausea, vomiting, constipation, or diarrhea.  He has no melena or hematochezia.  He has no urinary complaints.  Patient otherwise feels well and offers no further specific complaints today.  REVIEW OF SYSTEMS:   Review of Systems  Constitutional: Positive for malaise/fatigue and weight loss. Negative for fever.  Respiratory: Positive for cough. Negative for hemoptysis and shortness of breath.   Cardiovascular: Negative.  Negative for chest pain and leg swelling.  Gastrointestinal: Negative.  Negative for abdominal pain, blood in stool, constipation, diarrhea, melena, nausea and vomiting.  Genitourinary: Negative.  Negative for dysuria.  Musculoskeletal: Negative.  Negative for back pain.  Skin: Negative.  Negative for rash.  Neurological: Negative.  Negative for dizziness, focal weakness, weakness and headaches.  Psychiatric/Behavioral: The patient is nervous/anxious.     As per HPI. Otherwise, a complete review of systems is negative.  PAST MEDICAL HISTORY: Past Medical History:  Diagnosis Date   Essential hypertension    Hyperlipidemia    MI (myocardial infarction) ([place])      Obesity    Pacemaker    Shortness of breath dyspnea     PAST SURGICAL HISTORY: Past Surgical History:  Procedure Laterality Date   ABDOMINAL HERNIA REPAIR  2001   BACK SURGERY  1986   COLONOSCOPY WITH PROPOFOL N/A 05/19/2015   Procedure: COLONOSCOPY WITH PROPOFOL;  Surgeon: Hulen Luster, MD;  Location: [facility] ENDOSCOPY;  Service: Gastroenterology;  Laterality: N/A;   CORONARY ANGIOPLASTY     INSERT / REPLACE / REMOVE PACEMAKER     KNEE ARTHROSCOPY     PACEMAKER PLACEMENT     PROSTATE SURGERY     TONSILLECTOMY      FAMILY HISTORY: History reviewed. No pertinent family history.  ADVANCED DIRECTIVES (Y/N):  N  HEALTH MAINTENANCE: Social History   Tobacco Use   Smoking status: Former Smoker   Smokeless tobacco: Former Network engineer Use Topics   Alcohol use: Yes   Drug use: No     Colonoscopy:  PAP:  Bone density:  Lipid panel:  No Known Allergies  Current Outpatient Medications  Medication Sig Dispense Refill   albuterol (VENTOLIN HFA) 108 (90 Base) MCG/ACT inhaler Inhale 2 puffs into the lungs every 4 (four) hours as needed for wheezing or shortness of breath (cough). 8 g 0   aspirin 81 MG tablet Chew 81 mg by mouth daily.     Cholecalciferol (D 1000) 1000 units capsule Take 1 capsule by mouth daily.     clopidogrel (PLAVIX) 75 MG tablet Take 1 tablet (75 mg total) by mouth daily. 90 tablet 1   fluticasone (FLONASE) 50 MCG/ACT nasal spray Place 2 sprays into both nostrils daily. 16 g 6  Icosapent Ethyl (VASCEPA) 1 g CAPS Take 2 g by mouth 2 (two) times daily. 120 capsule 5   Magnesium Cl-Calcium Carbonate (SLOW MAGNESIUM/CALCIUM) 70-117 MG TBEC Take 1 tablet by mouth daily.      Multiple Vitamin (MULTIVITAMIN) capsule Take 1 capsule by mouth daily.     nebivolol (BYSTOLIC) 5 MG tablet Take 1 tablet (5 mg total) by mouth daily. 90 tablet 1   nitroGLYCERIN (NITROSTAT) 0.4 MG SL tablet Place 1 tablet (0.4 mg total) under the tongue as needed.  25 tablet 4   Omega-3 Fatty Acids (FISH OIL PO) Take 1 capsule by mouth 2 (two) times daily.      vitamin B-12 (CYANOCOBALAMIN) 250 MCG tablet Take 1 tablet by mouth daily.     No current facility-administered medications for this visit.     OBJECTIVE: Vitals:   01/16/19 1446  BP: (!) 140/98  Pulse: 80  Temp: (!) 96.5 F (35.8 C)     Body mass index is 29.77 kg/m.    ECOG FS:1 - Symptomatic but completely ambulatory  General: Well-developed, well-nourished, no acute distress. Eyes: Pink conjunctiva, anicteric sclera. HEENT: Normocephalic, moist mucous membranes, clear oropharnyx. Lungs: Clear to auscultation bilaterally. Heart: Regular rate and rhythm. No rubs, murmurs, or gallops. Abdomen: Soft, nontender, nondistended. No organomegaly noted, normoactive bowel sounds. Musculoskeletal: No edema, cyanosis, or clubbing. Neuro: Alert, answering all questions appropriately. Cranial nerves grossly intact. Skin: No rashes or petechiae noted. Psych: Normal affect. Lymphatics: No cervical, calvicular, axillary or inguinal LAD.   LAB RESULTS:  Lab Results  Component Value Date   NA 134 (L) 01/14/2019   K 4.3 01/14/2019   CL 99 01/14/2019   CO2 22 01/14/2019   GLUCOSE 119 (H) 01/14/2019   BUN 10 01/14/2019   CREATININE 0.88 01/14/2019   CALCIUM 8.6 (L) 01/14/2019   PROT 7.6 01/12/2019   ALBUMIN 3.1 (L) 01/12/2019   AST 45 (H) 01/12/2019   ALT 69 (H) 01/12/2019   ALKPHOS 524 (H) 01/12/2019   BILITOT 1.1 01/12/2019   GFRNONAA >60 01/14/2019   GFRAA >60 01/14/2019    Lab Results  Component Value Date   WBC 20.3 (H) 01/14/2019   NEUTROABS 4,661 08/06/2017   HGB 10.4 (L) 01/14/2019   HCT 31.5 (L) 01/14/2019   MCV 79.1 (L) 01/14/2019   PLT 326 01/14/2019     STUDIES: Ct Angio Chest Pe W And/or Wo Contrast  Result Date: 01/12/2019 CLINICAL DATA:  Cough for couple months, dizziness for few weeks, weakness, history pacemaker, coronary artery disease post angioplasty  and MI, essential hypertension, hyperlipidemia, obesity, former smoker EXAM: CT ANGIOGRAPHY CHEST WITH CONTRAST TECHNIQUE: Multidetector CT imaging of the chest was performed using the standard protocol during bolus administration of intravenous contrast. Multiplanar CT image reconstructions and MIPs were obtained to evaluate the vascular anatomy. CONTRAST:  66mL OMNIPAQUE IOHEXOL 350 MG/ML SOLN IV COMPARISON:  None FINDINGS: Cardiovascular: Atherosclerotic calcifications aorta, proximal great vessels and coronary arteries. Ascending thoracic aorta upper normal caliber without aneurysm or dissection. Pacemaker leads via LEFT subclavian approach project at RIGHT atrium, RIGHT ventricle, and coronary sinus. No pericardial effusion. Pulmonary arteries adequately opacified. Beam hardening artifacts from pacemaker leads and contrast in the SVC traverse the pulmonary arteries. No definite pulmonary emboli identified. Mediastinum/Nodes: Esophagus unremarkable. Base of cervical region normal appearance. No thoracic adenopathy. Minimal gynecomastia bilaterally. Lungs/Pleura: Minimal atelectasis at LEFT base. Lungs otherwise clear. No pulmonary infiltrate, pleural effusion, or pneumothorax. Upper Abdomen: Visualized upper abdomen unremarkable Musculoskeletal: No acute osseous findings.  Review of the MIP images confirms the above findings. IMPRESSION: No definite evidence of pulmonary embolism. Scattered atherosclerotic disease changes and pacemaker. Minimal LEFT basilar atelectasis without pulmonary infiltrate. Aortic Atherosclerosis (ICD10-I70.0). Electronically Signed   By: Lavonia Dana M.D.   On: 01/12/2019 12:17   Ct Abdomen Pelvis W Contrast  Result Date: 01/14/2019 CLINICAL DATA:  Liver lesion EXAM: CT ABDOMEN AND PELVIS WITH CONTRAST TECHNIQUE: Multidetector CT imaging of the abdomen and pelvis was performed using the standard protocol following bolus administration of intravenous contrast. CONTRAST:  126mL OMNIPAQUE  IOHEXOL 300 MG/ML  SOLN COMPARISON:  Liver ultrasound, 01/12/2019 FINDINGS: Lower chest: No acute abnormality. Extensive 3 vessel coronary artery calcifications and/or stents, and pacer leads. Hepatobiliary: Hepatomegaly, maximum coronal span 22.3 cm. There are innumerable, small hypodense lesions of liver parenchyma. There is ill-defined, somewhat masslike enlargement of the caudate lobe and adjacent segment IV (series 2, image 32), which in total measures approximately 8.2 x 6.0 cm (series 2, image 29). No gallstones, gallbladder wall thickening, or biliary dilatation. Pancreas: Unremarkable. No pancreatic ductal dilatation or surrounding inflammatory changes. Spleen: Normal in size without significant abnormality. Adrenals/Urinary Tract: Adrenal glands are unremarkable. Kidneys are normal, without renal calculi, solid lesion, or hydronephrosis. 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 Appendix appears normal. No evidence of bowel wall thickening, distention, or inflammatory changes. Vascular/Lymphatic: Aortic atherosclerosis. There are abnormally enlarged gastrohepatic, celiac axis, and left retroperitoneal nodes, largest nodes measuring approximately 1.8 cm (series 2, image 38). Reproductive: Prostatomegaly with TURP defect. Other: No abdominal wall hernia or abnormality. No abdominopelvic ascites. Musculoskeletal: No acute or significant osseous findings. IMPRESSION: 1. Hepatomegaly with innumerable, small hypodense lesions of the liver parenchyma, most consistent with metastatic disease, as queried on prior ultrasound examination. 2. There is no definite primary mass noted within the abdomen or pelvis, however there is ill-defined, somewhat masslike enlargement of the caudate lobe and adjacent segment IV (series 2, image 32), which in total measures approximately 8.2 x 6.0 cm (series 2, image 29). This  could represent a primary hepatocellular carcinoma. This lesion is ideally best characterized by multiphasic contrast enhanced MRI. 3. There are abnormally enlarged gastrohepatic, celiac axis, and left retroperitoneal nodes, largest nodes measuring approximately 1.8 cm (series 2, image 38), concerning for nodal metastatic disease. Electronically Signed   By: Eddie Candle M.D.   On: 01/14/2019 14:08   Dg Chest Portable 1 View  Result Date: 01/12/2019 CLINICAL DATA:  Cough.  Dizziness. EXAM: PORTABLE CHEST 1 VIEW COMPARISON:  One-view chest x-ray 10/23/2017 FINDINGS: The heart size is normal. Atherosclerotic calcifications are present at the aortic arch. Pacing wires are in place. Lung volumes are low. No focal airspace disease is present. There is no edema or effusion. IMPRESSION: 1. Low lung volumes. 2. Cardiac atherosclerosis. 3. No acute cardiopulmonary disease. Electronically Signed   By: San Morelle M.D.   On: 01/12/2019 10:28   US Abdomen Limited Ruq  Result Date: 01/12/2019 CLINICAL DATA:  Elevated liver function studies including alkaline phosphatase. Cough and dehydration. EXAM: ULTRASOUND ABDOMEN LIMITED RIGHT UPPER QUADRANT COMPARISON:  Chest CTA same date. FINDINGS: Gallbladder: No gallstones or wall thickening visualized. No sonographic Murphy sign noted by sonographer. Common bile duct: Diameter: 3 mm Liver: The liver is diffusely heterogeneous in echotexture. There are multiple predominately hypoechoic liver lesions. The largest of these measure up to 2.6 x 1.9 x 2.8 cm in the right lobe and up to  3.7 x 2.6 x 4.5 cm in the left lobe. These are not well visualized on the chest CTA done earlier. Portal vein is patent on color Doppler imaging with normal direction of blood flow towards the liver. IMPRESSION: 1. The gallbladder and biliary system appear normal. 2. Multiple hypoechoic liver lesions are worrisome for metastatic disease. Further evaluation recommended, ideally with MRI without  and with contrast. Since the patient IV contrast for a chest CTA earlier today, if evaluated by CT, that should be delayed by approximately 24 hours. Electronically Signed   By: Richardean Sale M.D.   On: 01/12/2019 13:45    ASSESSMENT:  Innumerable liver lesions and retroperitoneal lymphadenopathy highly suspicious for malignancy.  PLAN:    1. Innumerable liver lesions and retroperitoneal lymphadenopathy highly suspicious for malignancy: CT scan results from January 14, 2019 reviewed independently and report as above.  Patient has a mildly's elevated CEA at 7.1, but the remainder of his tumor markers are either negative or within normal limits.  Case discussed with interventional radiology and plan to get an ultrasound-guided biopsy of the liver to obtain a diagnosis.  We will get a PET scan for further evaluation and staging purposes.  Patient is also been instructed to keep his follow-up appointment with GI for consideration of colonoscopy.  Return to clinic in 2 weeks to discuss the results and possible treatment planning. 2.  Leukocytosis: Possibly reactive, monitor. 3.  Anemia: Mild.  Will get iron stores with next laboratory draw.  Patient expressed understanding and was in agreement with this plan. He also understands that He can call clinic at any time with any questions, concerns, or complaints.   Cancer Staging No matching staging information was found for the patient.  Lloyd Huger, MD   01/18/2019 10:29 AM

## 2019-01-20 ENCOUNTER — Other Ambulatory Visit: Payer: Self-pay

## 2019-01-20 ENCOUNTER — Other Ambulatory Visit: Payer: Self-pay | Admitting: Oncology

## 2019-01-20 ENCOUNTER — Encounter: Payer: Self-pay | Admitting: Family Medicine

## 2019-01-20 ENCOUNTER — Ambulatory Visit (INDEPENDENT_AMBULATORY_CARE_PROVIDER_SITE_OTHER): Payer: PPO | Admitting: Family Medicine

## 2019-01-20 DIAGNOSIS — J209 Acute bronchitis, unspecified: Secondary | ICD-10-CM

## 2019-01-20 DIAGNOSIS — I9589 Other hypotension: Secondary | ICD-10-CM | POA: Diagnosis not present

## 2019-01-20 DIAGNOSIS — R160 Hepatomegaly, not elsewhere classified: Secondary | ICD-10-CM

## 2019-01-20 DIAGNOSIS — E861 Hypovolemia: Secondary | ICD-10-CM | POA: Diagnosis not present

## 2019-01-20 NOTE — Patient Instructions (Addendum)
Follow up with Dr Grayland Ormond as planned  Stay well hydrated and try to improve nutrition  Can remain OFF Aldactone (spironolactone), Lasix, and Lisinopril for now - call Dr Ubaldo Glassing cardiology to schedule a follow-up to discuss blood pressure and when to restart these medicines.   Please schedule a Follow-up Appointment to: Return in about 3 months (around 04/22/2019), or if symptoms worsen or fail to improve.  If you have any other questions or concerns, please feel free to call the office or send a message through [facility]. You may also schedule an earlier appointment if necessary.  Additionally, you may be receiving a survey about your experience at our office within a few days to 1 week by e-mail or mail. We value your feedback.  Nobie Putnam, DO [place]

## 2019-01-20 NOTE — Progress Notes (Signed)
Subjective:    Patient ID: Jose Dunlap, male    DOB: 12-04-1950, 68 y.o.   MRN: [id number]  Jose Dunlap is a 68 y.o. male presenting on 01/20/2019 for Hypertension, Shortness of Breath (has cough onset month and SOB last few weeks but tested negative twice), and Hospitalization Follow-up  Virtual / Telehealth Encounter - Telephone  The purpose of this virtual visit is to provide medical care while limiting exposure to the novel coronavirus (COVID19) for both patient and office staff.  Consent was obtained for remote visit:  Yes.   Answered questions that patient had about telehealth interaction:  Yes.   I discussed the limitations, risks, security and privacy concerns of performing an evaluation and management service by video/telephone. I also discussed with the patient that there may be a patient responsible charge related to this service. The patient expressed understanding and agreed to proceed.  Patient Location: Home Provider Location: [facility] (Office)   [place] VISIT  Hospital/Location: [facility] Date of Admission: 01/12/19 Date of Discharge: 01/14/19 Transitions of care telephone call: Completed by Tyler Aas LPN on 09/12/62  Reason for Admission: Shortness of Breath Cough, Hypotension Primary (+Secondary) Diagnosis: Hypotension - thought to be dehydration volume depletion, Liver lesions/masses concern for metastatic disease, Acute Bronchitis  - Hospital H&P and Discharge Summary have been reviewed - Patient presents today 4 days after recent hospitalization. Brief summary of recent course, patient had symptoms of URI cough shortness of breath weakness fatigue, hospitalized, treated with antibiotics and steroid among other diagnostics, identified elevated LFT and Liver Lesions on imaging, followed with Oncology and ultimately he was treated as well with IVF rehydration and discharged. Outpatient follow-up Dr Grayland Ormond [facility], seen initially on  01/16/19, and scheduling biopsy and further imaging PET scan prior to treatment planning. Already scheduled for biopsy of liver ultrasound guided on Monday 7/20  - Today reports overall has done well after discharge but still admits significant weakness tired and fatigue. Symptoms of cough and dyspnea have improved, he still has lingering cough.   - New medications on discharge: Prednisone Azithromycin - Changes to current meds on discharge: Off Aspirin 81mg  daily for 1 week due to upcoming biopsy on Monday - Due to hypotension his BP meds were held. He was restarted on Bystolic on discharge, and still advised to HOLD - Aldactone, Lasix, Lisinopril. He will follow-up    have reviewed the discharge medication list, and have reconciled the current and discharge medications today.   Current Outpatient Medications:    aspirin 81 MG tablet, Chew 81 mg by mouth daily., Disp: , Rfl:    Cholecalciferol (D 1000) 1000 units capsule, Take 1 capsule by mouth daily., Disp: , Rfl:    nitroGLYCERIN (NITROSTAT) 0.4 MG SL tablet, Place 1 tablet (0.4 mg total) under the tongue as needed., Disp: 25 tablet, Rfl: 4   vitamin B-12 (CYANOCOBALAMIN) 250 MCG tablet, Take 1 tablet by mouth daily., Disp: , Rfl:    albuterol (VENTOLIN HFA) 108 (90 Base) MCG/ACT inhaler, Inhale 2 puffs into the lungs every 4 (four) hours as needed for wheezing or shortness of breath (cough). (Patient not taking: Reported on 01/20/2019), Disp: 8 g, Rfl: 0   clopidogrel (PLAVIX) 75 MG tablet, Take 1 tablet (75 mg total) by mouth daily. (Patient not taking: Reported on 01/20/2019), Disp: 90 tablet, Rfl: 1   fluticasone (FLONASE) 50 MCG/ACT nasal spray, Place 2 sprays into both nostrils daily. (Patient not taking: Reported on 01/20/2019), Disp:  16 g, Rfl: 6   Icosapent Ethyl (VASCEPA) 1 g CAPS, Take 2 g by mouth 2 (two) times daily. (Patient not taking: Reported on 01/20/2019), Disp: 120 capsule, Rfl: 5   Magnesium Cl-Calcium Carbonate (SLOW  MAGNESIUM/CALCIUM) 70-117 MG TBEC, Take 1 tablet by mouth daily. , Disp: , Rfl:    Multiple Vitamin (MULTIVITAMIN) capsule, Take 1 capsule by mouth daily., Disp: , Rfl:    nebivolol (BYSTOLIC) 5 MG tablet, Take 1 tablet (5 mg total) by mouth daily. (Patient not taking: Reported on 01/20/2019), Disp: 90 tablet, Rfl: 1   Omega-3 Fatty Acids (FISH OIL PO), Take 1 capsule by mouth 2 (two) times daily. , Disp: , Rfl:   ------------------------------------------------------------------------- Social History   Tobacco Use   Smoking status: Former Smoker   Smokeless tobacco: Former Network engineer Use Topics   Alcohol use: Yes   Drug use: No    Review of Systems Per HPI unless specifically indicated above     Objective:    There were no vitals taken for this visit.  Wt Readings from Last 3 Encounters:  01/16/19 219 lb 8 oz (99.6 kg)  01/14/19 224 lb 6.4 oz (101.8 kg)  07/25/18 240 lb (108.9 kg)    Physical Exam   No physical exam performed today. Virtual visit on telephone.   Results for orders placed or performed during the hospital encounter of 01/12/19  SARS Coronavirus 2 (CEPHEID - Performed in [place] hospital lab), [facility] Order   Specimen: Nasopharyngeal Swab  Result Value Ref Range   SARS Coronavirus 2 NEGATIVE NEGATIVE  CBC  Result Value Ref Range   WBC 21.9 (H) 4.0 - 10.5 K/uL   RBC 4.12 (L) 4.22 - 5.81 MIL/uL   Hemoglobin 10.9 (L) 13.0 - 17.0 g/dL   HCT 33.2 (L) 39.0 - 52.0 %   MCV 80.6 80.0 - 100.0 fL   MCH 26.5 26.0 - 34.0 pg   MCHC 32.8 30.0 - 36.0 g/dL   RDW 14.7 11.5 - 15.5 %   Platelets 352 150 - 400 K/uL   nRBC 0.0 0.0 - 0.2 %  Urinalysis, Complete w Microscopic  Result Value Ref Range   Color, Urine YELLOW (A) YELLOW   APPearance CLEAR (A) CLEAR   Specific Gravity, Urine 1.034 (H) 1.005 - 1.030   pH 6.0 5.0 - 8.0   Glucose, UA NEGATIVE NEGATIVE mg/dL   Hgb urine dipstick NEGATIVE NEGATIVE   Bilirubin Urine NEGATIVE NEGATIVE   Ketones, ur  NEGATIVE NEGATIVE mg/dL   Protein, ur NEGATIVE NEGATIVE mg/dL   Nitrite NEGATIVE NEGATIVE   Leukocytes,Ua NEGATIVE NEGATIVE   WBC, UA 0-5 0 - 5 WBC/hpf   Bacteria, UA NONE SEEN NONE SEEN   Squamous Epithelial / LPF NONE SEEN 0 - 5  Comprehensive metabolic panel  Result Value Ref Range   Sodium 134 (L) 135 - 145 mmol/L   Potassium 4.9 3.5 - 5.1 mmol/L   Chloride 98 98 - 111 mmol/L   CO2 25 22 - 32 mmol/L   Glucose, Bld 172 (H) 70 - 99 mg/dL   BUN 16 8 - 23 mg/dL   Creatinine, Ser 0.90 0.61 - 1.24 mg/dL   Calcium 8.9 8.9 - 10.3 mg/dL   Total Protein 7.5 6.5 - 8.1 g/dL   Albumin 2.9 (L) 3.5 - 5.0 g/dL   AST 53 (H) 15 - 41 U/L   ALT 75 (H) 0 - 44 U/L   Alkaline Phosphatase 532 (H) 38 - 126 U/L  Total Bilirubin 1.1 0.3 - 1.2 mg/dL   GFR calc non Af Amer >60 >60 mL/min   GFR calc Af Amer >60 >60 mL/min   Anion gap 11 5 - 15  Troponin I (High Sensitivity)  Result Value Ref Range   Troponin I (High Sensitivity) 19 (H) <18 ng/L  HIV antibody (Routine Testing)  Result Value Ref Range   HIV Screen 4th Generation wRfx Non Reactive Non Reactive  Comprehensive metabolic panel  Result Value Ref Range   Sodium 136 135 - 145 mmol/L   Potassium 5.1 3.5 - 5.1 mmol/L   Chloride 100 98 - 111 mmol/L   CO2 24 22 - 32 mmol/L   Glucose, Bld 106 (H) 70 - 99 mg/dL   BUN 14 8 - 23 mg/dL   Creatinine, Ser 0.86 0.61 - 1.24 mg/dL   Calcium 8.9 8.9 - 10.3 mg/dL   Total Protein 7.6 6.5 - 8.1 g/dL   Albumin 3.1 (L) 3.5 - 5.0 g/dL   AST 45 (H) 15 - 41 U/L   ALT 69 (H) 0 - 44 U/L   Alkaline Phosphatase 524 (H) 38 - 126 U/L   Total Bilirubin 1.1 0.3 - 1.2 mg/dL   GFR calc non Af Amer >60 >60 mL/min   GFR calc Af Amer >60 >60 mL/min   Anion gap 12 5 - 15  Basic metabolic panel  Result Value Ref Range   Sodium 135 135 - 145 mmol/L   Potassium 4.5 3.5 - 5.1 mmol/L   Chloride 101 98 - 111 mmol/L   CO2 24 22 - 32 mmol/L   Glucose, Bld 119 (H) 70 - 99 mg/dL   BUN 13 8 - 23 mg/dL   Creatinine, Ser  0.87 0.61 - 1.24 mg/dL   Calcium 8.2 (L) 8.9 - 10.3 mg/dL   GFR calc non Af Amer >60 >60 mL/min   GFR calc Af Amer >60 >60 mL/min   Anion gap 10 5 - 15  CBC  Result Value Ref Range   WBC 20.2 (H) 4.0 - 10.5 K/uL   RBC 3.84 (L) 4.22 - 5.81 MIL/uL   Hemoglobin 10.0 (L) 13.0 - 17.0 g/dL   HCT 30.8 (L) 39.0 - 52.0 %   MCV 80.2 80.0 - 100.0 fL   MCH 26.0 26.0 - 34.0 pg   MCHC 32.5 30.0 - 36.0 g/dL   RDW 14.7 11.5 - 15.5 %   Platelets 276 150 - 400 K/uL   nRBC 0.0 0.0 - 0.2 %  Glucose, capillary  Result Value Ref Range   Glucose-Capillary 113 (H) 70 - 99 mg/dL   Comment 1 Notify RN    Comment 2 Document in Chart   CEA  Result Value Ref Range   CEA 7.1 (H) 0.0 - 4.7 ng/mL  AFP tumor marker  Result Value Ref Range   AFP, Serum, Tumor Marker 3.6 0.0 - 8.3 ng/mL  Cancer antigen 19-9  Result Value Ref Range   CA 19-9 8 0 - 35 U/mL  CBC  Result Value Ref Range   WBC 20.3 (H) 4.0 - 10.5 K/uL   RBC 3.98 (L) 4.22 - 5.81 MIL/uL   Hemoglobin 10.4 (L) 13.0 - 17.0 g/dL   HCT 31.5 (L) 39.0 - 52.0 %   MCV 79.1 (L) 80.0 - 100.0 fL   MCH 26.1 26.0 - 34.0 pg   MCHC 33.0 30.0 - 36.0 g/dL   RDW 14.6 11.5 - 15.5 %   Platelets 326 150 - 400 K/uL  nRBC 0.0 0.0 - 0.2 %  Basic metabolic panel  Result Value Ref Range   Sodium 134 (L) 135 - 145 mmol/L   Potassium 4.3 3.5 - 5.1 mmol/L   Chloride 99 98 - 111 mmol/L   CO2 22 22 - 32 mmol/L   Glucose, Bld 119 (H) 70 - 99 mg/dL   BUN 10 8 - 23 mg/dL   Creatinine, Ser 0.88 0.61 - 1.24 mg/dL   Calcium 8.6 (L) 8.9 - 10.3 mg/dL   GFR calc non Af Amer >60 >60 mL/min   GFR calc Af Amer >60 >60 mL/min   Anion gap 13 5 - 15  Glucose, capillary  Result Value Ref Range   Glucose-Capillary 107 (H) 70 - 99 mg/dL  Glucose, capillary  Result Value Ref Range   Glucose-Capillary 97 70 - 99 mg/dL  ECHOCARDIOGRAM COMPLETE  Result Value Ref Range   Weight 3,590.4 oz   Height 72 in   BP 110/70 mmHg      Assessment & Plan:   Problem List Items Addressed  This Visit    Hypotension    Other Visit Diagnoses    Liver masses    -  Primary   Acute bronchitis, unspecified organism          Clinically with resolving hypotension but no recent BP checks at home Still holding  Aldactone (spironolactone), Lasix, and Lisinopril, awaiting to schedule w/ Dr Ubaldo Glassing [facility], asked him to call and schedule. He is doing well back on bystolic currently  Liver masses Concerning for new malignancy diagnosis, identified during hospitalization Followed now by [facility] Oncology Dr Grayland Ormond, has upcoming Liver Biopsy US guided on Monday 7/20, will remain off ASA 81 until biopsy, and then future PET Scan prior to further diagnosis and treatment plan per Oncology - Reassurance given on following this current diagnostic plan to learn more about how to treat this - Likely cause of some of his weakness and fatigue  Bronchitis - RESOLVED Residual lingering cough, but otherwise resolved on prednisone / antibiotic F/u if needed   No orders of the defined types were placed in this encounter.   Follow up plan: Return in about 3 months (around 04/22/2019), or if symptoms worsen or fail to improve.   Patient verbalizes understanding with the above medical recommendations including the limitation of remote medical advice.  Specific follow-up and call-back criteria were given for patient to follow-up or seek medical care more urgently if needed.  Total duration of direct patient care provided via telephone: 15 minutes    Nobie Putnam, [facility] 01/20/2019, 9:08 AM

## 2019-01-21 ENCOUNTER — Telehealth: Payer: Self-pay

## 2019-01-21 DIAGNOSIS — R160 Hepatomegaly, not elsewhere classified: Secondary | ICD-10-CM

## 2019-01-21 NOTE — Telephone Encounter (Signed)
Called patient to let him know that he will need a COVID-19 testing done on Friday 01/23/2019 and his Biopsy will be on Tuesday 01/28/2019.

## 2019-01-22 ENCOUNTER — Other Ambulatory Visit: Payer: PPO

## 2019-01-22 NOTE — Progress Notes (Signed)
Tumor Board Documentation  Haruki Arnold was presented by Dr Grayland Ormond at our Tumor Board on 01/22/2019, which included representatives from medical oncology, radiation oncology, pathology, radiology, surgical, surgical oncology, navigation, internal medicine, pharmacy, pulmonology, palliative care, research.  [place]ore currently presents as a new patient, for discussion with history of the following treatments: active survellience.  Additionally, we reviewed previous medical and familial history, history of present illness, and recent lab results along with all available histopathologic and imaging studies. The tumor board considered available treatment options and made the following recommendations: Biopsy, Additional screening Liver Biopsy, PET Scan  The following procedures/referrals were also placed: No orders of the defined types were placed in this encounter.   Clinical Trial Status: not discussed   Staging used: Not Applicable  National site-specific guidelines   were discussed with respect to the case.  Tumor board is a meeting of clinicians from various specialty areas who evaluate and discuss patients for whom a multidisciplinary approach is being considered. Final determinations in the plan of care are those of the provider(s). The responsibility for follow up of recommendations given during tumor board is that of the provider.   Today's extended care, comprehensive team conference, Gaylon was not present for the discussion and was not examined.   Multidisciplinary Tumor Board is a multidisciplinary case peer review process.  Decisions discussed in the Multidisciplinary Tumor Board reflect the opinions of the specialists present at the conference without having examined the patient.  Ultimately, treatment and diagnostic decisions rest with the primary provider(s) and the patient.

## 2019-01-23 ENCOUNTER — Other Ambulatory Visit
Admission: RE | Admit: 2019-01-23 | Discharge: 2019-01-23 | Disposition: A | Discharge status: Home / Self Care | Payer: PPO | Source: Ambulatory Visit | Attending: Oncology | Admitting: Oncology

## 2019-01-23 DIAGNOSIS — Z1159 Encounter for screening for other viral diseases: Secondary | ICD-10-CM | POA: Diagnosis not present

## 2019-01-23 LAB — SARS CORONAVIRUS 2 (TAT 6-24 HRS): SARS Coronavirus 2: NEGATIVE

## 2019-01-25 ENCOUNTER — Telehealth: Payer: Self-pay | Admitting: Family Medicine

## 2019-01-25 ENCOUNTER — Other Ambulatory Visit: Payer: Self-pay | Admitting: Radiology

## 2019-01-25 NOTE — Telephone Encounter (Signed)
Patient is having an U/S Guided Biopsy of his liver on Tuesday 01/27/2019- ordered by Dr. Grayland Ormond. Dr. Grayland Ormond told patient verbally to stop his Plavix and ASA  As patient's PCP, I would agree and provide authorization for patient to HOLD Aspirin 81mg  and Plavix 75mg  daily until he has completed procedure on 01/27/19, he may restart after biopsy as indicated by Dr Grayland Ormond.  He has stopped meds on 01/17/19 pre-op.  Nobie Putnam, [facility] 01/25/2019, 1:18 PM

## 2019-01-25 NOTE — Progress Notes (Deleted)
[place]  [phone] Fax:(336) [phone]  ID: Jose Dunlap OB: April 30, 1951  MR#: [id number]  MEQ#:[id number]  Patient Care Team: Olin Hauser, DO as PCP - General (Family Medicine)  CHIEF COMPLAINT: Innumerable liver lesions and retroperitoneal lymphadenopathy highly suspicious for malignancy.  INTERVAL HISTORY: Patient is a 68 year old male initially evaluated in the hospital who was found to have innumerable liver lesions on CT.  He initially presented with cough.  He has worsening weakness and fatigue and reports a 35 pound weight loss in the past 6 months.  He states he has a poor appetite.  He does not complain of pain.  He has no neurologic complaints.  He denies any recent fevers.  He has no chest pain, shortness of breath, or hemoptysis.  He denies any nausea, vomiting, constipation, or diarrhea.  He has no melena or hematochezia.  He has no urinary complaints.  Patient otherwise feels well and offers no further specific complaints today.  REVIEW OF SYSTEMS:   Review of Systems  Constitutional: Positive for malaise/fatigue and weight loss. Negative for fever.  Respiratory: Positive for cough. Negative for hemoptysis and shortness of breath.   Cardiovascular: Negative.  Negative for chest pain and leg swelling.  Gastrointestinal: Negative.  Negative for abdominal pain, blood in stool, constipation, diarrhea, melena, nausea and vomiting.  Genitourinary: Negative.  Negative for dysuria.  Musculoskeletal: Negative.  Negative for back pain.  Skin: Negative.  Negative for rash.  Neurological: Negative.  Negative for dizziness, focal weakness, weakness and headaches.  Psychiatric/Behavioral: The patient is nervous/anxious.     As per HPI. Otherwise, a complete review of systems is negative.  PAST MEDICAL HISTORY: Past Medical History:  Diagnosis Date   Essential hypertension    Hyperlipidemia    MI (myocardial infarction) ([place])      Obesity    Pacemaker    Shortness of breath dyspnea     PAST SURGICAL HISTORY: Past Surgical History:  Procedure Laterality Date   ABDOMINAL HERNIA REPAIR  2001   BACK SURGERY  1986   COLONOSCOPY WITH PROPOFOL N/A 05/19/2015   Procedure: COLONOSCOPY WITH PROPOFOL;  Surgeon: Hulen Luster, MD;  Location: [facility] ENDOSCOPY;  Service: Gastroenterology;  Laterality: N/A;   CORONARY ANGIOPLASTY     INSERT / REPLACE / REMOVE PACEMAKER     KNEE ARTHROSCOPY     PACEMAKER PLACEMENT     PROSTATE SURGERY     TONSILLECTOMY      FAMILY HISTORY: No family history on file.  ADVANCED DIRECTIVES (Y/N):  N  HEALTH MAINTENANCE: Social History   Tobacco Use   Smoking status: Former Smoker   Smokeless tobacco: Former Network engineer Use Topics   Alcohol use: Yes   Drug use: No     Colonoscopy:  PAP:  Bone density:  Lipid panel:  No Known Allergies  Current Outpatient Medications  Medication Sig Dispense Refill   albuterol (VENTOLIN HFA) 108 (90 Base) MCG/ACT inhaler Inhale 2 puffs into the lungs every 4 (four) hours as needed for wheezing or shortness of breath (cough). (Patient not taking: Reported on 01/20/2019) 8 g 0   aspirin 81 MG tablet Chew 81 mg by mouth daily.     Cholecalciferol (D 1000) 1000 units capsule Take 1 capsule by mouth daily.     clopidogrel (PLAVIX) 75 MG tablet Take 1 tablet (75 mg total) by mouth daily. (Patient not taking: Reported on 01/20/2019) 90 tablet 1   fluticasone (FLONASE) 50 MCG/ACT nasal spray Place  2 sprays into both nostrils daily. (Patient not taking: Reported on 01/20/2019) 16 g 6   Icosapent Ethyl (VASCEPA) 1 g CAPS Take 2 g by mouth 2 (two) times daily. (Patient not taking: Reported on 01/20/2019) 120 capsule 5   Magnesium Cl-Calcium Carbonate (SLOW MAGNESIUM/CALCIUM) 70-117 MG TBEC Take 1 tablet by mouth daily.      Multiple Vitamin (MULTIVITAMIN) capsule Take 1 capsule by mouth daily.     nebivolol (BYSTOLIC) 5 MG tablet  Take 1 tablet (5 mg total) by mouth daily. (Patient not taking: Reported on 01/20/2019) 90 tablet 1   nitroGLYCERIN (NITROSTAT) 0.4 MG SL tablet Place 1 tablet (0.4 mg total) under the tongue as needed. 25 tablet 4   Omega-3 Fatty Acids (FISH OIL PO) Take 1 capsule by mouth 2 (two) times daily.      vitamin B-12 (CYANOCOBALAMIN) 250 MCG tablet Take 1 tablet by mouth daily.     No current facility-administered medications for this visit.     OBJECTIVE: There were no vitals filed for this visit.   There is no height or weight on file to calculate BMI.    ECOG FS:1 - Symptomatic but completely ambulatory  General: Well-developed, well-nourished, no acute distress. Eyes: Pink conjunctiva, anicteric sclera. HEENT: Normocephalic, moist mucous membranes, clear oropharnyx. Lungs: Clear to auscultation bilaterally. Heart: Regular rate and rhythm. No rubs, murmurs, or gallops. Abdomen: Soft, nontender, nondistended. No organomegaly noted, normoactive bowel sounds. Musculoskeletal: No edema, cyanosis, or clubbing. Neuro: Alert, answering all questions appropriately. Cranial nerves grossly intact. Skin: No rashes or petechiae noted. Psych: Normal affect. Lymphatics: No cervical, calvicular, axillary or inguinal LAD.   LAB RESULTS:  Lab Results  Component Value Date   NA 134 (L) 01/14/2019   K 4.3 01/14/2019   CL 99 01/14/2019   CO2 22 01/14/2019   GLUCOSE 119 (H) 01/14/2019   BUN 10 01/14/2019   CREATININE 0.88 01/14/2019   CALCIUM 8.6 (L) 01/14/2019   PROT 7.6 01/12/2019   ALBUMIN 3.1 (L) 01/12/2019   AST 45 (H) 01/12/2019   ALT 69 (H) 01/12/2019   ALKPHOS 524 (H) 01/12/2019   BILITOT 1.1 01/12/2019   GFRNONAA >60 01/14/2019   GFRAA >60 01/14/2019    Lab Results  Component Value Date   WBC 20.3 (H) 01/14/2019   NEUTROABS 4,661 08/06/2017   HGB 10.4 (L) 01/14/2019   HCT 31.5 (L) 01/14/2019   MCV 79.1 (L) 01/14/2019   PLT 326 01/14/2019     STUDIES: Ct Angio Chest Pe W  And/or Wo Contrast  Result Date: 01/12/2019 CLINICAL DATA:  Cough for couple months, dizziness for few weeks, weakness, history pacemaker, coronary artery disease post angioplasty and MI, essential hypertension, hyperlipidemia, obesity, former smoker EXAM: CT ANGIOGRAPHY CHEST WITH CONTRAST TECHNIQUE: Multidetector CT imaging of the chest was performed using the standard protocol during bolus administration of intravenous contrast. Multiplanar CT image reconstructions and MIPs were obtained to evaluate the vascular anatomy. CONTRAST:  39mL OMNIPAQUE IOHEXOL 350 MG/ML SOLN IV COMPARISON:  None FINDINGS: Cardiovascular: Atherosclerotic calcifications aorta, proximal great vessels and coronary arteries. Ascending thoracic aorta upper normal caliber without aneurysm or dissection. Pacemaker leads via LEFT subclavian approach project at RIGHT atrium, RIGHT ventricle, and coronary sinus. No pericardial effusion. Pulmonary arteries adequately opacified. Beam hardening artifacts from pacemaker leads and contrast in the SVC traverse the pulmonary arteries. No definite pulmonary emboli identified. Mediastinum/Nodes: Esophagus unremarkable. Base of cervical region normal appearance. No thoracic adenopathy. Minimal gynecomastia bilaterally. Lungs/Pleura: Minimal atelectasis at LEFT base.  Lungs otherwise clear. No pulmonary infiltrate, pleural effusion, or pneumothorax. Upper Abdomen: Visualized upper abdomen unremarkable Musculoskeletal: No acute osseous findings. Review of the MIP images confirms the above findings. IMPRESSION: No definite evidence of pulmonary embolism. Scattered atherosclerotic disease changes and pacemaker. Minimal LEFT basilar atelectasis without pulmonary infiltrate. Aortic Atherosclerosis (ICD10-I70.0). Electronically Signed   By: Lavonia Dana M.D.   On: 01/12/2019 12:17   Ct Abdomen Pelvis W Contrast  Result Date: 01/14/2019 CLINICAL DATA:  Liver lesion EXAM: CT ABDOMEN AND PELVIS WITH CONTRAST  TECHNIQUE: Multidetector CT imaging of the abdomen and pelvis was performed using the standard protocol following bolus administration of intravenous contrast. CONTRAST:  127mL OMNIPAQUE IOHEXOL 300 MG/ML  SOLN COMPARISON:  Liver ultrasound, 01/12/2019 FINDINGS: Lower chest: No acute abnormality. Extensive 3 vessel coronary artery calcifications and/or stents, and pacer leads. Hepatobiliary: Hepatomegaly, maximum coronal span 22.3 cm. There are innumerable, small hypodense lesions of liver parenchyma. There is ill-defined, somewhat masslike enlargement of the caudate lobe and adjacent segment IV (series 2, image 32), which in total measures approximately 8.2 x 6.0 cm (series 2, image 29). No gallstones, gallbladder wall thickening, or biliary dilatation. Pancreas: Unremarkable. No pancreatic ductal dilatation or surrounding inflammatory changes. Spleen: Normal in size without significant abnormality. Adrenals/Urinary Tract: Adrenal glands are unremarkable. Kidneys are normal, without renal calculi, solid lesion, or hydronephrosis. 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 Appendix appears normal. No evidence of bowel wall thickening, distention, or inflammatory changes. Vascular/Lymphatic: Aortic atherosclerosis. There are abnormally enlarged gastrohepatic, celiac axis, and left retroperitoneal nodes, largest nodes measuring approximately 1.8 cm (series 2, image 38). Reproductive: Prostatomegaly with TURP defect. Other: No abdominal wall hernia or abnormality. No abdominopelvic ascites. Musculoskeletal: No acute or significant osseous findings. IMPRESSION: 1. Hepatomegaly with innumerable, small hypodense lesions of the liver parenchyma, most consistent with metastatic disease, as queried on prior ultrasound examination. 2. There is no definite primary mass noted within the abdomen or pelvis, however there is  ill-defined, somewhat masslike enlargement of the caudate lobe and adjacent segment IV (series 2, image 32), which in total measures approximately 8.2 x 6.0 cm (series 2, image 29). This could represent a primary hepatocellular carcinoma. This lesion is ideally best characterized by multiphasic contrast enhanced MRI. 3. There are abnormally enlarged gastrohepatic, celiac axis, and left retroperitoneal nodes, largest nodes measuring approximately 1.8 cm (series 2, image 38), concerning for nodal metastatic disease. Electronically Signed   By: Eddie Candle M.D.   On: 01/14/2019 14:08   Dg Chest Portable 1 View  Result Date: 01/12/2019 CLINICAL DATA:  Cough.  Dizziness. EXAM: PORTABLE CHEST 1 VIEW COMPARISON:  One-view chest x-ray 10/23/2017 FINDINGS: The heart size is normal. Atherosclerotic calcifications are present at the aortic arch. Pacing wires are in place. Lung volumes are low. No focal airspace disease is present. There is no edema or effusion. IMPRESSION: 1. Low lung volumes. 2. Cardiac atherosclerosis. 3. No acute cardiopulmonary disease. Electronically Signed   By: San Morelle M.D.   On: 01/12/2019 10:28   US Abdomen Limited Ruq  Result Date: 01/12/2019 CLINICAL DATA:  Elevated liver function studies including alkaline phosphatase. Cough and dehydration. EXAM: ULTRASOUND ABDOMEN LIMITED RIGHT UPPER QUADRANT COMPARISON:  Chest CTA same date. FINDINGS: Gallbladder: No gallstones or wall thickening visualized. No sonographic Murphy sign noted by sonographer. Common bile duct: Diameter: 3 mm Liver: The liver is diffusely heterogeneous in echotexture. There are multiple predominately hypoechoic liver  lesions. The largest of these measure up to 2.6 x 1.9 x 2.8 cm in the right lobe and up to 3.7 x 2.6 x 4.5 cm in the left lobe. These are not well visualized on the chest CTA done earlier. Portal vein is patent on color Doppler imaging with normal direction of blood flow towards the liver.  IMPRESSION: 1. The gallbladder and biliary system appear normal. 2. Multiple hypoechoic liver lesions are worrisome for metastatic disease. Further evaluation recommended, ideally with MRI without and with contrast. Since the patient IV contrast for a chest CTA earlier today, if evaluated by CT, that should be delayed by approximately 24 hours. Electronically Signed   By: Richardean Sale M.D.   On: 01/12/2019 13:45    ASSESSMENT:  Innumerable liver lesions and retroperitoneal lymphadenopathy highly suspicious for malignancy.  PLAN:    1. Innumerable liver lesions and retroperitoneal lymphadenopathy highly suspicious for malignancy: CT scan results from January 14, 2019 reviewed independently and report as above.  Patient has a mildly's elevated CEA at 7.1, but the remainder of his tumor markers are either negative or within normal limits.  Case discussed with interventional radiology and plan to get an ultrasound-guided biopsy of the liver to obtain a diagnosis.  We will get a PET scan for further evaluation and staging purposes.  Patient is also been instructed to keep his follow-up appointment with GI for consideration of colonoscopy.  Return to clinic in 2 weeks to discuss the results and possible treatment planning. 2.  Leukocytosis: Possibly reactive, monitor. 3.  Anemia: Mild.  Will get iron stores with next laboratory draw.  Patient expressed understanding and was in agreement with this plan. He also understands that He can call clinic at any time with any questions, concerns, or complaints.   Cancer Staging No matching staging information was found for the patient.  Lloyd Huger, MD   01/25/2019 8:47 AM

## 2019-01-26 ENCOUNTER — Other Ambulatory Visit: Payer: Self-pay

## 2019-01-26 ENCOUNTER — Ambulatory Visit
Admission: RE | Admit: 2019-01-26 | Discharge: 2019-01-26 | Disposition: A | Discharge status: Home / Self Care | Payer: PPO | Source: Ambulatory Visit | Attending: Oncology | Admitting: Oncology

## 2019-01-26 ENCOUNTER — Other Ambulatory Visit: Payer: Self-pay | Admitting: Student

## 2019-01-26 DIAGNOSIS — R590 Localized enlarged lymph nodes: Secondary | ICD-10-CM | POA: Diagnosis not present

## 2019-01-26 DIAGNOSIS — R160 Hepatomegaly, not elsewhere classified: Secondary | ICD-10-CM | POA: Diagnosis not present

## 2019-01-26 LAB — GLUCOSE, CAPILLARY: Glucose-Capillary: 102 mg/dL — ABNORMAL HIGH (ref 70–99)

## 2019-01-26 MED ORDER — FLUDEOXYGLUCOSE F - 18 (FDG) INJECTION
11.1000 | Freq: Once | INTRAVENOUS | Status: AC | PRN
  Administered 2019-01-26: 09:00:00 10.93 via INTRAVENOUS

## 2019-01-27 ENCOUNTER — Ambulatory Visit
Admission: RE | Admit: 2019-01-27 | Discharge: 2019-01-27 | Disposition: A | Discharge status: Home / Self Care | Payer: PPO | Source: Ambulatory Visit | Attending: Oncology | Admitting: Oncology

## 2019-01-27 ENCOUNTER — Other Ambulatory Visit: Payer: Self-pay

## 2019-01-27 DIAGNOSIS — K7689 Other specified diseases of liver: Secondary | ICD-10-CM | POA: Diagnosis not present

## 2019-01-27 DIAGNOSIS — C787 Secondary malignant neoplasm of liver and intrahepatic bile duct: Secondary | ICD-10-CM | POA: Diagnosis not present

## 2019-01-27 DIAGNOSIS — C229 Malignant neoplasm of liver, not specified as primary or secondary: Secondary | ICD-10-CM | POA: Diagnosis not present

## 2019-01-27 DIAGNOSIS — R160 Hepatomegaly, not elsewhere classified: Secondary | ICD-10-CM | POA: Diagnosis not present

## 2019-01-27 DIAGNOSIS — Z7689 Persons encountering health services in other specified circumstances: Secondary | ICD-10-CM | POA: Diagnosis not present

## 2019-01-27 LAB — CBC
HCT: 30.6 % — ABNORMAL LOW (ref 39.0–52.0)
Hemoglobin: 10.1 g/dL — ABNORMAL LOW (ref 13.0–17.0)
MCH: 25.4 pg — ABNORMAL LOW (ref 26.0–34.0)
MCHC: 33 g/dL (ref 30.0–36.0)
MCV: 77.1 fL — ABNORMAL LOW (ref 80.0–100.0)
Platelets: 392 10*3/uL (ref 150–400)
RBC: 3.97 MIL/uL — ABNORMAL LOW (ref 4.22–5.81)
RDW: 15.4 % (ref 11.5–15.5)
WBC: 33.5 10*3/uL — ABNORMAL HIGH (ref 4.0–10.5)
nRBC: 0 % (ref 0.0–0.2)

## 2019-01-27 LAB — PROTIME-INR
INR: 1.3 — ABNORMAL HIGH (ref 0.8–1.2)
Prothrombin Time: 16.3 seconds — ABNORMAL HIGH (ref 11.4–15.2)

## 2019-01-27 MED ORDER — SODIUM CHLORIDE 0.9 % IV SOLN
INTRAVENOUS | Status: DC
  Administered 2019-01-27: 08:00:00 via INTRAVENOUS

## 2019-01-27 MED ORDER — FENTANYL CITRATE (PF) 100 MCG/2ML IJ SOLN
INTRAMUSCULAR | Status: AC
  Filled 2019-01-27: qty 2

## 2019-01-27 MED ORDER — FENTANYL CITRATE (PF) 100 MCG/2ML IJ SOLN
INTRAMUSCULAR | Status: AC | PRN
  Administered 2019-01-27: 25 ug via INTRAVENOUS
  Administered 2019-01-27: 50 ug via INTRAVENOUS

## 2019-01-27 MED ORDER — MIDAZOLAM HCL 5 MG/5ML IJ SOLN
INTRAMUSCULAR | Status: AC
  Filled 2019-01-27: qty 5

## 2019-01-27 MED ORDER — MIDAZOLAM HCL 5 MG/5ML IJ SOLN
INTRAMUSCULAR | Status: AC | PRN
  Administered 2019-01-27 (×2): 1 mg via INTRAVENOUS

## 2019-01-27 NOTE — Discharge Instructions (Signed)
Moderate Conscious Sedation, Adult, Care After °These instructions provide you with information about caring for yourself after your procedure. Your health care provider may also give you more specific instructions. Your treatment has been planned according to current medical practices, but problems sometimes occur. Call your health care provider if you have any problems or questions after your procedure. °What can I expect after the procedure? °After your procedure, it is common: °· To feel sleepy for several hours. °· To feel clumsy and have poor balance for several hours. °· To have poor judgment for several hours. °· To vomit if you eat too soon. °Follow these instructions at home: °For at least 24 hours after the procedure: ° °· Do not: °? Participate in activities where you could fall or become injured. °? Drive. °? Use heavy machinery. °? Drink alcohol. °? Take sleeping pills or medicines that cause drowsiness. °? Make important decisions or sign legal documents. °? Take care of children on your own. °· Rest. °Eating and drinking °· Follow the diet recommended by your health care provider. °· If you vomit: °? Drink water, juice, or soup when you can drink without vomiting. °? Make sure you have little or no nausea before eating solid foods. °General instructions °· Have a responsible adult stay with you until you are awake and alert. °· Take over-the-counter and prescription medicines only as told by your health care provider. °· If you smoke, do not smoke without supervision. °· Keep all follow-up visits as told by your health care provider. This is important. °Contact a health care provider if: °· You keep feeling nauseous or you keep vomiting. °· You feel light-headed. °· You develop a rash. °· You have a fever. °Get help right away if: °· You have trouble breathing. °This information is not intended to replace advice given to you by your health care provider. Make sure you discuss any questions you have  with your health care provider. °Document Released: 04/15/2013 Document Revised: 06/07/2017 Document Reviewed: 10/15/2015 °Elsevier Patient Education © 2020 [employer] °Needle Biopsy, Care After °These instructions tell you how to care for yourself after your procedure. Your doctor may also give you more specific instructions. Call your doctor if you have any problems or questions. °What can I expect after the procedure? °After the procedure, it is common to have: °· Soreness. °· Bruising. °· Mild pain. °Follow these instructions at home: ° °· Return to your normal activities as told by your doctor. Ask your doctor what activities are safe for you. °· Take over-the-counter and prescription medicines only as told by your doctor. °· Wash your hands with soap and water before you change your bandage (dressing). If you cannot use soap and water, use hand sanitizer. °· Follow instructions from your doctor about: °? How to take care of your puncture site. °? When and how to change your bandage. °? When to remove your bandage. °· Check your puncture site every day for signs of infection. Watch for: °? Redness, swelling, or pain. °? Fluid or blood.  °? Pus or a bad smell. °? Warmth. °· Do not take baths, swim, or use a hot tub until your doctor approves. Ask your doctor if you may take showers. You may only be allowed to take sponge baths. °· Keep all follow-up visits as told by your doctor. This is important. °Contact a doctor if you have: °· A fever. °· Redness, swelling, or pain at the puncture site, and it lasts longer than a few   days. °· Fluid, blood, or pus coming from the puncture site. °· Warmth coming from the puncture site. °Get help right away if: °· You have a lot of bleeding from the puncture site. °Summary °· After the procedure, it is common to have soreness, bruising, or mild pain at the puncture site. °· Check your puncture site every day for signs of infection, such as redness, swelling, or pain. °· Get  help right away if you have severe bleeding from your puncture site. °This information is not intended to replace advice given to you by your health care provider. Make sure you discuss any questions you have with your health care provider. °Document Released: 06/07/2008 Document Revised: 07/08/2017 Document Reviewed: 07/08/2017 °Elsevier Patient Education © 2020 [employer] ° °

## 2019-01-27 NOTE — Sedation Documentation (Signed)
Total conscious sedation: Versed 2 mg IV, Fentanyl 75 mcg IV. Pt. Tolerated procedure well.

## 2019-01-27 NOTE — Procedures (Signed)
Interventional Radiology Procedure Note  Procedure: US Guided Biopsy of Liver  Complications: None  Estimated Blood Loss: < 10 mL  Findings: 58 G core biopsy of liver lesions in left lobe of liver performed under US guidance.  Three core samples obtained and sent to Pathology.  Venetia Night. Kathlene Cote, M.D Pager:  [phone]

## 2019-01-27 NOTE — Progress Notes (Signed)
Contacted [employer], patient's fiance', providing discharge instructions and discharge pick-up time. Jose Dunlap stated understanding and no further questions at this time.

## 2019-01-30 ENCOUNTER — Inpatient Hospital Stay: Payer: PPO | Admitting: Oncology

## 2019-02-01 DIAGNOSIS — Z7189 Other specified counseling: Secondary | ICD-10-CM | POA: Insufficient documentation

## 2019-02-01 NOTE — Progress Notes (Signed)
[place]  [phone]) [phone] [phone]) [phone]  ID: Jose Dunlap OB: [birth year]  MR#: [id number]  WIO#:[id number]  Patient Care Team: Olin Hauser, DO as PCP - General (Family Medicine)  CHIEF COMPLAINT: Innumerable liver lesions and retroperitoneal lymphadenopathy highly suspicious for malignancy.  INTERVAL HISTORY: Patient returns to clinic today for discussion of his imaging results and further evaluation.  He continues to have persistent weakness and fatigue and continued weight loss.  He has significant insomnia, anxiety, and poor appetite.  He does not complain of pain.  He has no neurologic complaints.  He denies any recent fevers or illnesses.  He has no chest pain, shortness of breath, or hemoptysis.  He continues to have chronic cough.  He denies any nausea, vomiting, constipation, or diarrhea.  He has no melena or hematochezia.  He has no urinary complaints.  Patient offers no further specific complaints today.  REVIEW OF SYSTEMS:   Review of Systems  Constitutional: Positive for malaise/fatigue and weight loss. Negative for fever.  Respiratory: Positive for cough. Negative for hemoptysis and shortness of breath.   Cardiovascular: Negative.  Negative for chest pain and leg swelling.  Gastrointestinal: Negative.  Negative for abdominal pain, blood in stool, constipation, diarrhea, melena, nausea and vomiting.  Genitourinary: Negative.  Negative for dysuria.  Musculoskeletal: Negative.  Negative for back pain.  Skin: Negative.  Negative for rash.  Neurological: Positive for weakness. Negative for dizziness, focal weakness and headaches.  Psychiatric/Behavioral: The patient is nervous/anxious and has insomnia.     As per HPI. Otherwise, a complete review of systems is negative.  PAST MEDICAL HISTORY: Past Medical History:  Diagnosis Date   Essential hypertension    Hyperlipidemia    MI (myocardial infarction) [facility])    Obesity      Pacemaker    PACER/DEFIBRILLATOR [address] [place] [employer]   Shortness of breath dyspnea     PAST SURGICAL HISTORY: Past Surgical History:  Procedure Laterality Date   ABDOMINAL HERNIA REPAIR  2001   BACK SURGERY  1986   COLONOSCOPY WITH PROPOFOL N/A 05/19/2015   Procedure: COLONOSCOPY WITH PROPOFOL;  Surgeon: Hulen Luster, MD;  Location: [facility] ENDOSCOPY;  Service: Gastroenterology;  Laterality: N/A;   CORONARY ANGIOPLASTY     INSERT / REPLACE / REMOVE PACEMAKER     KNEE ARTHROSCOPY     PACEMAKER PLACEMENT     PROSTATE SURGERY     TONSILLECTOMY      FAMILY HISTORY: No family history on file.  ADVANCED DIRECTIVES (Y/N):  N  HEALTH MAINTENANCE: Social History   Tobacco Use   Smoking status: Former Smoker   Smokeless tobacco: Former Network engineer Use Topics   Alcohol use: Yes   Drug use: No     Colonoscopy:  PAP:  Bone density:  Lipid panel:  No Known Allergies  Current Outpatient Medications  Medication Sig Dispense Refill   albuterol (VENTOLIN HFA) 108 (90 Base) MCG/ACT inhaler Inhale 2 puffs into the lungs every 4 (four) hours as needed for wheezing or shortness of breath (cough). 8 g 0   aspirin 81 MG tablet Chew 81 mg by mouth daily.     Cholecalciferol (D 1000) 1000 units capsule Take 1 capsule by mouth daily.     clopidogrel (PLAVIX) 75 MG tablet Take 1 tablet (75 mg total) by mouth daily. 90 tablet 1   Magnesium Cl-Calcium Carbonate (SLOW MAGNESIUM/CALCIUM) 70-117 MG TBEC Take 1 tablet by mouth daily.  Multiple Vitamin (MULTIVITAMIN) capsule Take 1 capsule by mouth daily.     nebivolol (BYSTOLIC) 5 MG tablet Take 1 tablet (5 mg total) by mouth daily. 90 tablet 1   nitroGLYCERIN (NITROSTAT) 0.4 MG SL tablet Place 1 tablet (0.4 mg total) under the tongue as needed. 25 tablet 4   Omega-3 Fatty Acids (FISH OIL PO) Take 1 capsule by mouth 2 (two) times daily.      vitamin B-12 (CYANOCOBALAMIN) 250 MCG tablet Take 1 tablet by  mouth daily.     ALPRAZolam (XANAX) 0.5 MG tablet Take 1 tablet (0.5 mg total) by mouth at bedtime as needed for anxiety. 30 tablet 1   benzonatate (TESSALON) 100 MG capsule Take 1 capsule (100 mg total) by mouth 3 (three) times daily as needed for cough. 90 capsule 0   fluticasone (FLONASE) 50 MCG/ACT nasal spray Place 2 sprays into both nostrils daily. (Patient not taking: Reported on 01/20/2019) 16 g 6   Icosapent Ethyl (VASCEPA) 1 g CAPS Take 2 g by mouth 2 (two) times daily. (Patient not taking: Reported on 01/20/2019) 120 capsule 5   No current facility-administered medications for this visit.     OBJECTIVE: Vitals:   02/03/19 0954  BP: (!) 140/8  Pulse: 89  Temp: (!) 96.1 F (35.6 C)     Body mass index is 28.36 kg/m.    ECOG FS:1 - Symptomatic but completely ambulatory  General: Well-developed, well-nourished, no acute distress. Eyes: Pink conjunctiva, anicteric sclera. HEENT: Normocephalic, moist mucous membranes. Lungs: Clear to auscultation bilaterally. Heart: Regular rate and rhythm. No rubs, murmurs, or gallops. Abdomen: Soft, nontender, nondistended. No organomegaly noted, normoactive bowel sounds. Musculoskeletal: No edema, cyanosis, or clubbing. Neuro: Alert, answering all questions appropriately. Cranial nerves grossly intact. Skin: No rashes or petechiae noted. Psych: Normal affect.  LAB RESULTS:  Lab Results  Component Value Date   NA 134 (L) 01/14/2019   K 4.3 01/14/2019   CL 99 01/14/2019   CO2 22 01/14/2019   GLUCOSE 119 (H) 01/14/2019   BUN 10 01/14/2019   CREATININE 0.88 01/14/2019   CALCIUM 8.6 (L) 01/14/2019   PROT 7.6 01/12/2019   ALBUMIN 3.1 (L) 01/12/2019   AST 45 (H) 01/12/2019   ALT 69 (H) 01/12/2019   ALKPHOS 524 (H) 01/12/2019   BILITOT 1.1 01/12/2019   GFRNONAA >60 01/14/2019   GFRAA >60 01/14/2019    Lab Results  Component Value Date   WBC 33.5 (H) 01/27/2019   NEUTROABS 4,661 08/06/2017   HGB 10.1 (L) 01/27/2019   HCT 30.6  (L) 01/27/2019   MCV 77.1 (L) 01/27/2019   PLT 392 01/27/2019     STUDIES: Ct Angio Chest Pe W And/or Wo Contrast  Result Date: 01/12/2019 CLINICAL DATA:  Cough for couple months, dizziness for few weeks, weakness, history pacemaker, coronary artery disease post angioplasty and MI, essential hypertension, hyperlipidemia, obesity, former smoker EXAM: CT ANGIOGRAPHY CHEST WITH CONTRAST TECHNIQUE: Multidetector CT imaging of the chest was performed using the standard protocol during bolus administration of intravenous contrast. Multiplanar CT image reconstructions and MIPs were obtained to evaluate the vascular anatomy. CONTRAST:  42mL OMNIPAQUE IOHEXOL 350 MG/ML SOLN IV COMPARISON:  None FINDINGS: Cardiovascular: Atherosclerotic calcifications aorta, proximal great vessels and coronary arteries. Ascending thoracic aorta upper normal caliber without aneurysm or dissection. Pacemaker leads via LEFT subclavian approach project at RIGHT atrium, RIGHT ventricle, and coronary sinus. No pericardial effusion. Pulmonary arteries adequately opacified. Beam hardening artifacts from pacemaker leads and contrast in the SVC traverse  the pulmonary arteries. No definite pulmonary emboli identified. Mediastinum/Nodes: Esophagus unremarkable. Base of cervical region normal appearance. No thoracic adenopathy. Minimal gynecomastia bilaterally. Lungs/Pleura: Minimal atelectasis at LEFT base. Lungs otherwise clear. No pulmonary infiltrate, pleural effusion, or pneumothorax. Upper Abdomen: Visualized upper abdomen unremarkable Musculoskeletal: No acute osseous findings. Review of the MIP images confirms the above findings. IMPRESSION: No definite evidence of pulmonary embolism. Scattered atherosclerotic disease changes and pacemaker. Minimal LEFT basilar atelectasis without pulmonary infiltrate. Aortic Atherosclerosis (ICD10-I70.0). Electronically Signed   By: Lavonia Dana M.D.   On: 01/12/2019 12:17   Ct Abdomen Pelvis W  Contrast  Result Date: 01/14/2019 CLINICAL DATA:  Liver lesion EXAM: CT ABDOMEN AND PELVIS WITH CONTRAST TECHNIQUE: Multidetector CT imaging of the abdomen and pelvis was performed using the standard protocol following bolus administration of intravenous contrast. CONTRAST:  160mL OMNIPAQUE IOHEXOL 300 MG/ML  SOLN COMPARISON:  Liver ultrasound, 01/12/2019 FINDINGS: Lower chest: No acute abnormality. Extensive 3 vessel coronary artery calcifications and/or stents, and pacer leads. Hepatobiliary: Hepatomegaly, maximum coronal span 22.3 cm. There are innumerable, small hypodense lesions of liver parenchyma. There is ill-defined, somewhat masslike enlargement of the caudate lobe and adjacent segment IV (series 2, image 32), which in total measures approximately 8.2 x 6.0 cm (series 2, image 29). No gallstones, gallbladder wall thickening, or biliary dilatation. Pancreas: Unremarkable. No pancreatic ductal dilatation or surrounding inflammatory changes. Spleen: Normal in size without significant abnormality. Adrenals/Urinary Tract: Adrenal glands are unremarkable. Kidneys are normal, without renal calculi, solid lesion, or hydronephrosis. 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 Appendix appears normal. No evidence of bowel wall thickening, distention, or inflammatory changes. Vascular/Lymphatic: Aortic atherosclerosis. There are abnormally enlarged gastrohepatic, celiac axis, and left retroperitoneal nodes, largest nodes measuring approximately 1.8 cm (series 2, image 38). Reproductive: Prostatomegaly with TURP defect. Other: No abdominal wall hernia or abnormality. No abdominopelvic ascites. Musculoskeletal: No acute or significant osseous findings. IMPRESSION: 1. Hepatomegaly with innumerable, small hypodense lesions of the liver parenchyma, most consistent with metastatic disease, as queried on prior ultrasound  examination. 2. There is no definite primary mass noted within the abdomen or pelvis, however there is ill-defined, somewhat masslike enlargement of the caudate lobe and adjacent segment IV (series 2, image 32), which in total measures approximately 8.2 x 6.0 cm (series 2, image 29). This could represent a primary hepatocellular carcinoma. This lesion is ideally best characterized by multiphasic contrast enhanced MRI. 3. There are abnormally enlarged gastrohepatic, celiac axis, and left retroperitoneal nodes, largest nodes measuring approximately 1.8 cm (series 2, image 38), concerning for nodal metastatic disease. Electronically Signed   By: Eddie Candle M.D.   On: 01/14/2019 14:08   Nm Pet Image Initial (pi) Skull Base To Thigh  Result Date: 01/26/2019 CLINICAL DATA:  Initial treatment strategy for liver masses and retroperitoneal adenopathy. EXAM: NUCLEAR MEDICINE PET SKULL BASE TO THIGH TECHNIQUE: 10.9 mCi F-18 FDG was injected intravenously. Full-ring PET imaging was performed from the skull base to thigh after the radiotracer. CT data was obtained and used for attenuation correction and anatomic localization. Fasting blood glucose: 102 mg/dl COMPARISON:  Multiple exams, including CT from 01/14/2019 and ultrasound from 01/12/2019 FINDINGS: Mediastinal blood pool activity: SUV max 2.5 Liver activity: SUV max NA NECK: 0.8 cm left level IV lymph node on image 69/3 with maximum SUV 3.7. Incidental CT findings: Bilateral common carotid atherosclerotic calcification. CHEST: No significant abnormal hypermetabolic activity in this region. Incidental  CT findings: Mild cardiomegaly. Pacer/AICD. No hypermetabolic or pathologically enlarged adenopathy in the chest. ABDOMEN/PELVIS: Hypermetabolic scattered hepatic activity corresponding to the innumerable hepatic nodule shown on recent PET-CT. The confluent mass involving the caudate lobe of the liver has a maximum SUV of 11.1. Multifocal hypermetabolic activity in  segment 4b of the liver has maximum SUV of 8.4. Other areas of confluence of hypermetabolic activity include the dome of segment 2 laterally in segment 4a of the liver, but there scattered hypermetabolic nodules throughout the liver. Just below the caudate lobe a hypermetabolic lymph node measuring approximately 2.6 cm in short axis on image 173/3 has a maximum SUV of 10.1. Additional hypermetabolic porta hepatis and retroperitoneal lymph nodes are present. An index left periaortic lymph node measuring 1.2 cm in short axis on image 176/3 has maximum SUV of 6.6. Small hypermetabolic peripancreatic lymph nodes are present. I do not observe a definite hypermetabolic pancreatic mass. There is some accentuated metabolic activity throughout much of the ascending colon, without CT correlate. No hypermetabolic small bowel mass is identified. There is some accentuated activity in the vicinity of the anus, maximum SUV 7.7. Incidental CT findings: Scalloped contour of the left mid to lower kidney posteriorly with underlying mild precontrast accentuated density, as on image 193/3. This may be from localized scarring although the hyperdense appearance is slightly unusual. I do not observe abnormal accentuated metabolic activity in this vicinity compared to the rest of kidney. Aortoiliac atherosclerotic vascular disease. SKELETON: Diffuse mildly accentuated activity throughout the skeleton with some mild heterogeneity but without CT correlate. Representative activity in the left upper sacral ala maximum SUV 6.3. I do not see obvious asymmetry. Incidental CT findings: none IMPRESSION: 1. Hypermetabolic masses are scattered in the liver, with the largest/most confluent in the caudate lobe with maximum SUV of 11.1. 2. There is adjacent hypermetabolic porta hepatis and retroperitoneal adenopathy. 3. I do not identify a definite separate primary. There is some accentuated activity throughout the ascending colon but without CT  correlate, this probably physiologic. There is also some localized activity at the anus, probably physiologic, although correlation with digital rectal exam is suggested. The lack of a definite separate primary raises the possibility of a primary liver lesion such as cholangiocarcinoma or hepatocellular carcinoma. 4. Outside of the abdomen, note is made of a 0.8 cm left neck level IV lymph node with maximum SUV of 3.7 which is mildly above the blood pool. This lesion is nonspecific but a metastatic lesion is not totally excluded. 5. There is diffuse accentuated activity throughout the skeleton with some mild heterogeneity but without a CT correlate or significant asymmetry. Electronically Signed   By: Van Clines M.D.   On: 01/26/2019 13:11   US Biopsy (liver)  Result Date: 01/27/2019 INDICATION: Multiple liver lesions throughout the liver with no current known source for metastatic disease. The patient presents for biopsy of the liver. EXAM: ULTRASOUND GUIDED CORE BIOPSY OF LIVER MEDICATIONS: None. ANESTHESIA/SEDATION: Fentanyl 75 mcg IV; Versed 2.0 mg IV Moderate Sedation Time:  13 minutes. The patient was continuously monitored during the procedure by the interventional radiology nurse under my direct supervision. PROCEDURE: The procedure, risks, benefits, and alternatives were explained to the patient. Questions regarding the procedure were encouraged and answered. The patient understands and consents to the procedure. A time-out was performed prior to initiating the procedure. Ultrasound was initially performed of the liver to localize lesions. The abdominal wall was prepped with chlorhexidine in a sterile fashion, and a sterile drape was  applied covering the operative field. A sterile gown and sterile gloves were used for the procedure. Local anesthesia was provided with 1% Lidocaine. Under ultrasound guidance, a 17 gauge trocar needle was advanced into the left lobe of the liver. Three separate 18  gauge core biopsy samples were then obtained under ultrasound guidance with an automated device. Core biopsy samples were submitted in formalin. Cut Gel-Foam pledgets were advanced through the outer needle as the needle was retracted. Additional ultrasound was performed. COMPLICATIONS: None immediate. FINDINGS: Innumerable hypoechoic lesions are seen throughout the liver parenchyma. Adjacent lesions were chosen in the medial segment of the left lobe of the liver with the largest measuring approximately 1.6 cm. Solid tissue was obtained. IMPRESSION: Ultrasound-guided core biopsy performed of lesions in the left lobe of the liver. Electronically Signed   By: Aletta Edouard M.D.   On: 01/27/2019 10:44   Dg Chest Portable 1 View  Result Date: 01/12/2019 CLINICAL DATA:  Cough.  Dizziness. EXAM: PORTABLE CHEST 1 VIEW COMPARISON:  One-view chest x-ray 10/23/2017 FINDINGS: The heart size is normal. Atherosclerotic calcifications are present at the aortic arch. Pacing wires are in place. Lung volumes are low. No focal airspace disease is present. There is no edema or effusion. IMPRESSION: 1. Low lung volumes. 2. Cardiac atherosclerosis. 3. No acute cardiopulmonary disease. Electronically Signed   By: San Morelle M.D.   On: 01/12/2019 10:28   US Abdomen Limited Ruq  Result Date: 01/12/2019 CLINICAL DATA:  Elevated liver function studies including alkaline phosphatase. Cough and dehydration. EXAM: ULTRASOUND ABDOMEN LIMITED RIGHT UPPER QUADRANT COMPARISON:  Chest CTA same date. FINDINGS: Gallbladder: No gallstones or wall thickening visualized. No sonographic Murphy sign noted by sonographer. Common bile duct: Diameter: 3 mm Liver: The liver is diffusely heterogeneous in echotexture. There are multiple predominately hypoechoic liver lesions. The largest of these measure up to 2.6 x 1.9 x 2.8 cm in the right lobe and up to 3.7 x 2.6 x 4.5 cm in the left lobe. These are not well visualized on the chest CTA done  earlier. Portal vein is patent on color Doppler imaging with normal direction of blood flow towards the liver. IMPRESSION: 1. The gallbladder and biliary system appear normal. 2. Multiple hypoechoic liver lesions are worrisome for metastatic disease. Further evaluation recommended, ideally with MRI without and with contrast. Since the patient IV contrast for a chest CTA earlier today, if evaluated by CT, that should be delayed by approximately 24 hours. Electronically Signed   By: Richardean Sale M.D.   On: 01/12/2019 13:45    ASSESSMENT:  Innumerable liver lesions and retroperitoneal lymphadenopathy highly suspicious for malignancy, possible cholangiocarcinoma.  PLAN:    1. Innumerable liver lesions and retroperitoneal lymphadenopathy highly suspicious for malignancy: PET scan results from January 26, 2019 reviewed independently and reported as above with confluence hypermetabolic masses throughout the liver as well as porta hepatis and retroperitoneal lymphadenopathy.  Patient has a mildly's elevated CEA at 7.1, but the remainder of his tumor markers are either negative or within normal limits.  Case was discussed with pathology and although malignancy was confirmed, it does not appear to be consistent with primary colon or had a cellular carcinoma.  Cholangiocarcinoma is a possibility, but final pathology results are pending.  Patient will require chemotherapy for treatment, therefore he has been referred for port placement.   Patient is also been instructed to keep his follow-up appointment with GI for consideration of colonoscopy.  Once final pathology is resulted, patient will have  video enabled telemedicine visit to discuss results and treatment planning. 2.  Leukocytosis: Possibly reactive, monitor. 3.  Anemia: Mild.  Will get iron stores with next laboratory draw. 4.  Insomnia/anxiety: Patient was given a prescription for Xanax today. 5.  Cough: Patient was given a prescription for Tessalon  Per[place].   Patient expressed understanding and was in agreement with this plan. He also understands that He can call clinic at any time with any questions, concerns, or complaints.   Cancer Staging No matching staging information was found for the patient.  Lloyd Huger, MD   02/05/2019 6:37 AM

## 2019-02-03 ENCOUNTER — Other Ambulatory Visit: Payer: Self-pay | Admitting: *Deleted

## 2019-02-03 ENCOUNTER — Inpatient Hospital Stay (HOSPITAL_BASED_OUTPATIENT_CLINIC_OR_DEPARTMENT_OTHER): Payer: PPO | Admitting: Oncology

## 2019-02-03 ENCOUNTER — Other Ambulatory Visit: Payer: Self-pay

## 2019-02-03 VITALS — BP 140/8 | HR 89 | Temp 96.1°F | Wt 209.1 lb

## 2019-02-03 DIAGNOSIS — N400 Enlarged prostate without lower urinary tract symptoms: Secondary | ICD-10-CM

## 2019-02-03 DIAGNOSIS — Z87891 Personal history of nicotine dependence: Secondary | ICD-10-CM

## 2019-02-03 DIAGNOSIS — D72829 Elevated white blood cell count, unspecified: Secondary | ICD-10-CM | POA: Diagnosis not present

## 2019-02-03 DIAGNOSIS — I2510 Atherosclerotic heart disease of native coronary artery without angina pectoris: Secondary | ICD-10-CM

## 2019-02-03 DIAGNOSIS — R160 Hepatomegaly, not elsewhere classified: Secondary | ICD-10-CM

## 2019-02-03 DIAGNOSIS — E860 Dehydration: Secondary | ICD-10-CM | POA: Diagnosis not present

## 2019-02-03 DIAGNOSIS — R970 Elevated carcinoembryonic antigen [CEA]: Secondary | ICD-10-CM | POA: Diagnosis not present

## 2019-02-03 DIAGNOSIS — R630 Anorexia: Secondary | ICD-10-CM | POA: Diagnosis not present

## 2019-02-03 DIAGNOSIS — R05 Cough: Secondary | ICD-10-CM | POA: Diagnosis not present

## 2019-02-03 DIAGNOSIS — I10 Essential (primary) hypertension: Secondary | ICD-10-CM | POA: Diagnosis not present

## 2019-02-03 DIAGNOSIS — R590 Localized enlarged lymph nodes: Secondary | ICD-10-CM

## 2019-02-03 DIAGNOSIS — G4700 Insomnia, unspecified: Secondary | ICD-10-CM

## 2019-02-03 DIAGNOSIS — I252 Old myocardial infarction: Secondary | ICD-10-CM

## 2019-02-03 DIAGNOSIS — R059 Cough, unspecified: Secondary | ICD-10-CM

## 2019-02-03 DIAGNOSIS — F419 Anxiety disorder, unspecified: Secondary | ICD-10-CM

## 2019-02-03 DIAGNOSIS — N62 Hypertrophy of breast: Secondary | ICD-10-CM

## 2019-02-03 DIAGNOSIS — D649 Anemia, unspecified: Secondary | ICD-10-CM

## 2019-02-03 DIAGNOSIS — E785 Hyperlipidemia, unspecified: Secondary | ICD-10-CM

## 2019-02-03 DIAGNOSIS — R5383 Other fatigue: Secondary | ICD-10-CM | POA: Diagnosis not present

## 2019-02-03 DIAGNOSIS — K769 Liver disease, unspecified: Secondary | ICD-10-CM

## 2019-02-03 DIAGNOSIS — Z7189 Other specified counseling: Secondary | ICD-10-CM

## 2019-02-03 DIAGNOSIS — R634 Abnormal weight loss: Secondary | ICD-10-CM

## 2019-02-03 DIAGNOSIS — I700 Atherosclerosis of aorta: Secondary | ICD-10-CM

## 2019-02-03 DIAGNOSIS — Z79899 Other long term (current) drug therapy: Secondary | ICD-10-CM

## 2019-02-03 MED ORDER — ALPRAZOLAM 0.5 MG PO TABS: 0.5000 mg | ORAL_TABLET | Freq: Every evening | ORAL | 1 refills | Status: AC | PRN

## 2019-02-03 MED ORDER — BENZONATATE 100 MG PO CAPS: 100.0000 mg | ORAL_CAPSULE | Freq: Three times a day (TID) | ORAL | 0 refills | Status: DC | PRN

## 2019-02-03 MED ORDER — ALPRAZOLAM 0.5 MG PO TABS: 0.5000 mg | ORAL_TABLET | Freq: Every evening | ORAL | 1 refills | Status: DC | PRN

## 2019-02-03 NOTE — Telephone Encounter (Signed)
Prescription did not get to pharmacy. (It printed)

## 2019-02-05 ENCOUNTER — Telehealth: Payer: Self-pay | Admitting: Nurse Practitioner

## 2019-02-05 ENCOUNTER — Other Ambulatory Visit: Payer: Self-pay | Admitting: Oncology

## 2019-02-05 LAB — SURGICAL PATHOLOGY

## 2019-02-05 NOTE — Telephone Encounter (Signed)
Patient will have Port-a-cath placement on 02/13/2019.  He should HOLD his aspirin and Plavix prior to the procedure beginning on 02/07/2019.  Restart after placement as indicated by Dr. Grayland Ormond.    I am covering for his PCP, Dr. Nobie Putnam, while he is out of office.  Cassell Smiles, DNP, AGPCNP-BC Adult Gerontology Primary Care Nurse Practitioner [facility]

## 2019-02-08 DIAGNOSIS — C221 Intrahepatic bile duct carcinoma: Secondary | ICD-10-CM | POA: Insufficient documentation

## 2019-02-08 NOTE — Progress Notes (Signed)
[place]  Telephone:(336) [phone] Fax:(336) [phone]  ID: Roosevelt Eimers OB: 05/03/1951  MR#: [id number]  AOZ#:[id number]  Patient Care Team: Olin Hauser, DO as PCP - General (Family Medicine)  I connected with Laurence Compton on 02/11/19 at 11:15 AM EDT by video enabled telemedicine visit and verified that I am speaking with the correct person using two identifiers.   I discussed the limitations, risks, security and privacy concerns of performing an evaluation and management service by telemedicine and the availability of in-person appointments. I also discussed with the patient that there may be a patient responsible charge related to this service. The patient expressed understanding and agreed to proceed.   Other persons participating in the visit and their role in the encounter: Patient, patient's wife, MD  Patients location: Home Providers location: Clinic  CHIEF COMPLAINT: Cholangiocarcinoma  INTERVAL HISTORY: Patient agreed to video enabled telemedicine visit to discuss his final pathology results and treatment planning.  He feels he is getting more weak and fatigue and his performance status is declining.  He continues to have a poor appetite and lose weight. He does not complain of pain.  He has no neurologic complaints.  He denies any recent fevers or illnesses.  He has no chest pain, shortness of breath, or hemoptysis.  He continues to have chronic cough.  He denies any nausea, vomiting, constipation, or diarrhea.  He has no melena or hematochezia.  He has no urinary complaints.  Patient feels generally terrible, but offers no further specific complaints today.  REVIEW OF SYSTEMS:   Review of Systems  Constitutional: Positive for malaise/fatigue and weight loss. Negative for fever.  Respiratory: Positive for cough. Negative for hemoptysis and shortness of breath.   Cardiovascular: Negative.  Negative for chest pain and leg swelling.    Gastrointestinal: Negative.  Negative for abdominal pain, blood in stool, constipation, diarrhea, melena, nausea and vomiting.  Genitourinary: Negative.  Negative for dysuria.  Musculoskeletal: Negative.  Negative for back pain.  Skin: Negative.  Negative for rash.  Neurological: Positive for weakness. Negative for dizziness, focal weakness and headaches.  Psychiatric/Behavioral: The patient is nervous/anxious and has insomnia.     As per HPI. Otherwise, a complete review of systems is negative.  PAST MEDICAL HISTORY: Past Medical History:  Diagnosis Date   Essential hypertension    Hyperlipidemia    MI (myocardial infarction) [facility])    Obesity    Pacemaker    PACER/DEFIBRILLATOR [address], [place] [employer]   Shortness of breath dyspnea     PAST SURGICAL HISTORY: Past Surgical History:  Procedure Laterality Date   ABDOMINAL HERNIA REPAIR  2001   BACK SURGERY  1986   COLONOSCOPY WITH PROPOFOL N/A 05/19/2015   Procedure: COLONOSCOPY WITH PROPOFOL;  Surgeon: Hulen Luster, MD;  Location: [facility] ENDOSCOPY;  Service: Gastroenterology;  Laterality: N/A;   CORONARY ANGIOPLASTY     INSERT / REPLACE / REMOVE PACEMAKER     KNEE ARTHROSCOPY     PACEMAKER PLACEMENT     PROSTATE SURGERY     TONSILLECTOMY      FAMILY HISTORY: No family history on file.  ADVANCED DIRECTIVES (Y/N):  N  HEALTH MAINTENANCE: Social History   Tobacco Use   Smoking status: Former Smoker   Smokeless tobacco: Former Network engineer Use Topics   Alcohol use: Yes   Drug use: No     Colonoscopy:  PAP:  Bone density:  Lipid panel:  No Known Allergies  Current  Outpatient Medications  Medication Sig Dispense Refill   albuterol (VENTOLIN HFA) 108 (90 Base) MCG/ACT inhaler Inhale 2 puffs into the lungs every 4 (four) hours as needed for wheezing or shortness of breath (cough). 8 g 0   ALPRAZolam (XANAX) 0.5 MG tablet Take 1 tablet (0.5 mg total) by mouth at bedtime as needed for  anxiety. 30 tablet 1   aspirin 81 MG tablet Chew 81 mg by mouth daily.     benzonatate (TESSALON) 100 MG capsule Take 1 capsule (100 mg total) by mouth 3 (three) times daily as needed for cough. 90 capsule 0   Cholecalciferol (D 1000) 1000 units capsule Take 1 capsule by mouth daily.     clopidogrel (PLAVIX) 75 MG tablet Take 1 tablet (75 mg total) by mouth daily. 90 tablet 1   fluticasone (FLONASE) 50 MCG/ACT nasal spray Place 2 sprays into both nostrils daily. (Patient not taking: Reported on 01/20/2019) 16 g 6   Icosapent Ethyl (VASCEPA) 1 g CAPS Take 2 g by mouth 2 (two) times daily. (Patient not taking: Reported on 01/20/2019) 120 capsule 5   Magnesium Cl-Calcium Carbonate (SLOW MAGNESIUM/CALCIUM) 70-117 MG TBEC Take 1 tablet by mouth daily.      Multiple Vitamin (MULTIVITAMIN) capsule Take 1 capsule by mouth daily.     nebivolol (BYSTOLIC) 5 MG tablet Take 1 tablet (5 mg total) by mouth daily. 90 tablet 1   nitroGLYCERIN (NITROSTAT) 0.4 MG SL tablet Place 1 tablet (0.4 mg total) under the tongue as needed. 25 tablet 4   Omega-3 Fatty Acids (FISH OIL PO) Take 1 capsule by mouth 2 (two) times daily.      vitamin B-12 (CYANOCOBALAMIN) 250 MCG tablet Take 1 tablet by mouth daily.     No current facility-administered medications for this visit.     OBJECTIVE: There were no vitals filed for this visit.   There is no height or weight on file to calculate BMI.    ECOG FS:2 - Symptomatic, <50% confined to bed  General: Ill-appearing, no acute distress. HEENT: Normocephalic. Musculoskeletal: No edema, cyanosis, or clubbing. Neuro: Alert, answering all questions appropriately. Cranial nerves grossly intact. Skin: No rashes or petechiae noted. Psych: Flat affect.  LAB RESULTS:  Lab Results  Component Value Date   NA 134 (L) 01/14/2019   K 4.3 01/14/2019   CL 99 01/14/2019   CO2 22 01/14/2019   GLUCOSE 119 (H) 01/14/2019   BUN 10 01/14/2019   CREATININE 0.88 01/14/2019    CALCIUM 8.6 (L) 01/14/2019   PROT 7.6 01/12/2019   ALBUMIN 3.1 (L) 01/12/2019   AST 45 (H) 01/12/2019   ALT 69 (H) 01/12/2019   ALKPHOS 524 (H) 01/12/2019   BILITOT 1.1 01/12/2019   GFRNONAA >60 01/14/2019   GFRAA >60 01/14/2019    Lab Results  Component Value Date   WBC 33.5 (H) 01/27/2019   NEUTROABS 4,661 08/06/2017   HGB 10.1 (L) 01/27/2019   HCT 30.6 (L) 01/27/2019   MCV 77.1 (L) 01/27/2019   PLT 392 01/27/2019     STUDIES: Ct Abdomen Pelvis W Contrast  Result Date: 01/14/2019 CLINICAL DATA:  Liver lesion EXAM: CT ABDOMEN AND PELVIS WITH CONTRAST TECHNIQUE: Multidetector CT imaging of the abdomen and pelvis was performed using the standard protocol following bolus administration of intravenous contrast. CONTRAST:  129mL OMNIPAQUE IOHEXOL 300 MG/ML  SOLN COMPARISON:  Liver ultrasound, 01/12/2019 FINDINGS: Lower chest: No acute abnormality. Extensive 3 vessel coronary artery calcifications and/or stents, and pacer leads. Hepatobiliary: Hepatomegaly, maximum  coronal span 22.3 cm. There are innumerable, small hypodense lesions of liver parenchyma. There is ill-defined, somewhat masslike enlargement of the caudate lobe and adjacent segment IV (series 2, image 32), which in total measures approximately 8.2 x 6.0 cm (series 2, image 29). No gallstones, gallbladder wall thickening, or biliary dilatation. Pancreas: Unremarkable. No pancreatic ductal dilatation or surrounding inflammatory changes. Spleen: Normal in size without significant abnormality. Adrenals/Urinary Tract: Adrenal glands are unremarkable. Kidneys are normal, without renal calculi, solid lesion, or hydronephrosis. 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 Appendix appears normal. No evidence of bowel wall thickening, distention, or inflammatory changes. Vascular/Lymphatic: Aortic atherosclerosis. There are abnormally enlarged  gastrohepatic, celiac axis, and left retroperitoneal nodes, largest nodes measuring approximately 1.8 cm (series 2, image 38). Reproductive: Prostatomegaly with TURP defect. Other: No abdominal wall hernia or abnormality. No abdominopelvic ascites. Musculoskeletal: No acute or significant osseous findings. IMPRESSION: 1. Hepatomegaly with innumerable, small hypodense lesions of the liver parenchyma, most consistent with metastatic disease, as queried on prior ultrasound examination. 2. There is no definite primary mass noted within the abdomen or pelvis, however there is ill-defined, somewhat masslike enlargement of the caudate lobe and adjacent segment IV (series 2, image 32), which in total measures approximately 8.2 x 6.0 cm (series 2, image 29). This could represent a primary hepatocellular carcinoma. This lesion is ideally best characterized by multiphasic contrast enhanced MRI. 3. There are abnormally enlarged gastrohepatic, celiac axis, and left retroperitoneal nodes, largest nodes measuring approximately 1.8 cm (series 2, image 38), concerning for nodal metastatic disease. Electronically Signed   By: Eddie Candle M.D.   On: 01/14/2019 14:08   Nm Pet Image Initial (pi) Skull Base To Thigh  Result Date: 01/26/2019 CLINICAL DATA:  Initial treatment strategy for liver masses and retroperitoneal adenopathy. EXAM: NUCLEAR MEDICINE PET SKULL BASE TO THIGH TECHNIQUE: 10.9 mCi F-18 FDG was injected intravenously. Full-ring PET imaging was performed from the skull base to thigh after the radiotracer. CT data was obtained and used for attenuation correction and anatomic localization. Fasting blood glucose: 102 mg/dl COMPARISON:  Multiple exams, including CT from 01/14/2019 and ultrasound from 01/12/2019 FINDINGS: Mediastinal blood pool activity: SUV max 2.5 Liver activity: SUV max NA NECK: 0.8 cm left level IV lymph node on image 69/3 with maximum SUV 3.7. Incidental CT findings: Bilateral common carotid  atherosclerotic calcification. CHEST: No significant abnormal hypermetabolic activity in this region. Incidental CT findings: Mild cardiomegaly. Pacer/AICD. No hypermetabolic or pathologically enlarged adenopathy in the chest. ABDOMEN/PELVIS: Hypermetabolic scattered hepatic activity corresponding to the innumerable hepatic nodule shown on recent PET-CT. The confluent mass involving the caudate lobe of the liver has a maximum SUV of 11.1. Multifocal hypermetabolic activity in segment 4b of the liver has maximum SUV of 8.4. Other areas of confluence of hypermetabolic activity include the dome of segment 2 laterally in segment 4a of the liver, but there scattered hypermetabolic nodules throughout the liver. Just below the caudate lobe a hypermetabolic lymph node measuring approximately 2.6 cm in short axis on image 173/3 has a maximum SUV of 10.1. Additional hypermetabolic porta hepatis and retroperitoneal lymph nodes are present. An index left periaortic lymph node measuring 1.2 cm in short axis on image 176/3 has maximum SUV of 6.6. Small hypermetabolic peripancreatic lymph nodes are present. I do not observe a definite hypermetabolic pancreatic mass. There is some accentuated metabolic activity throughout much of the ascending colon, without CT correlate. No  hypermetabolic small bowel mass is identified. There is some accentuated activity in the vicinity of the anus, maximum SUV 7.7. Incidental CT findings: Scalloped contour of the left mid to lower kidney posteriorly with underlying mild precontrast accentuated density, as on image 193/3. This may be from localized scarring although the hyperdense appearance is slightly unusual. I do not observe abnormal accentuated metabolic activity in this vicinity compared to the rest of kidney. Aortoiliac atherosclerotic vascular disease. SKELETON: Diffuse mildly accentuated activity throughout the skeleton with some mild heterogeneity but without CT correlate. Representative  activity in the left upper sacral ala maximum SUV 6.3. I do not see obvious asymmetry. Incidental CT findings: none IMPRESSION: 1. Hypermetabolic masses are scattered in the liver, with the largest/most confluent in the caudate lobe with maximum SUV of 11.1. 2. There is adjacent hypermetabolic porta hepatis and retroperitoneal adenopathy. 3. I do not identify a definite separate primary. There is some accentuated activity throughout the ascending colon but without CT correlate, this probably physiologic. There is also some localized activity at the anus, probably physiologic, although correlation with digital rectal exam is suggested. The lack of a definite separate primary raises the possibility of a primary liver lesion such as cholangiocarcinoma or hepatocellular carcinoma. 4. Outside of the abdomen, note is made of a 0.8 cm left neck level IV lymph node with maximum SUV of 3.7 which is mildly above the blood pool. This lesion is nonspecific but a metastatic lesion is not totally excluded. 5. There is diffuse accentuated activity throughout the skeleton with some mild heterogeneity but without a CT correlate or significant asymmetry. Electronically Signed   By: Van Clines M.D.   On: 01/26/2019 13:11   US Biopsy (liver)  Result Date: 01/27/2019 INDICATION: Multiple liver lesions throughout the liver with no current known source for metastatic disease. The patient presents for biopsy of the liver. EXAM: ULTRASOUND GUIDED CORE BIOPSY OF LIVER MEDICATIONS: None. ANESTHESIA/SEDATION: Fentanyl 75 mcg IV; Versed 2.0 mg IV Moderate Sedation Time:  13 minutes. The patient was continuously monitored during the procedure by the interventional radiology nurse under my direct supervision. PROCEDURE: The procedure, risks, benefits, and alternatives were explained to the patient. Questions regarding the procedure were encouraged and answered. The patient understands and consents to the procedure. A time-out was  performed prior to initiating the procedure. Ultrasound was initially performed of the liver to localize lesions. The abdominal wall was prepped with chlorhexidine in a sterile fashion, and a sterile drape was applied covering the operative field. A sterile gown and sterile gloves were used for the procedure. Local anesthesia was provided with 1% Lidocaine. Under ultrasound guidance, a 17 gauge trocar needle was advanced into the left lobe of the liver. Three separate 18 gauge core biopsy samples were then obtained under ultrasound guidance with an automated device. Core biopsy samples were submitted in formalin. Cut Gel-Foam pledgets were advanced through the outer needle as the needle was retracted. Additional ultrasound was performed. COMPLICATIONS: None immediate. FINDINGS: Innumerable hypoechoic lesions are seen throughout the liver parenchyma. Adjacent lesions were chosen in the medial segment of the left lobe of the liver with the largest measuring approximately 1.6 cm. Solid tissue was obtained. IMPRESSION: Ultrasound-guided core biopsy performed of lesions in the left lobe of the liver. Electronically Signed   By: Aletta Edouard M.D.   On: 01/27/2019 10:44    ASSESSMENT: Cholangiocarcinoma.  PLAN:    1.Cholangiocarcinoma: PET scan results from January 26, 2019 reviewed independently and reported as above with  confluence hypermetabolic masses throughout the liver as well as porta hepatis and retroperitoneal lymphadenopathy.  Patient has a mildly's elevated CEA at 7.1, but the remainder of his tumor markers are either negative or within normal limits.  Case was discussed with pathology most likely diagnosis is cholangiocarcinoma.  Patient wishes to pursue palliative chemotherapy.  He will have a port placed on Monday and then return to clinic on February 16, 2021 initiate cycle 1 of cisplatin and gemcitabine.  Case was discussed with pathology and although malignancy was confirmed, it does not appear to be  consistent with primary colon or had a cellular carcinoma.  Cholangiocarcinoma is a possibility, but final pathology results are pending.  Patient will require chemotherapy for treatment, therefore he has been referred for port placement.   Patient is also been instructed to keep his follow-up appointment with GI for consideration of colonoscopy.  Once final pathology is resulted, patient will have video enabled telemedicine visit to discuss results and treatment planning. 2.  Leukocytosis: Possibly reactive, monitor. 3.  Anemia: Mild.  Will get iron stores with next laboratory draw. 4.  Insomnia/anxiety: Patient was given a prescription for Xanax today. 5.  Cough: Patient was given a prescription for Tessalon Perles.   Patient expressed understanding and was in agreement with this plan. He also understands that He can call clinic at any time with any questions, concerns, or complaints.   Cancer Staging No matching staging information was found for the patient.  Lloyd Huger, MD   02/11/2019 3:58 PM

## 2019-02-10 ENCOUNTER — Inpatient Hospital Stay: Admission: RE | Admit: 2019-02-10 | Payer: PPO | Source: Ambulatory Visit

## 2019-02-10 ENCOUNTER — Inpatient Hospital Stay: Payer: PPO | Attending: Oncology | Admitting: Oncology

## 2019-02-10 DIAGNOSIS — Z5111 Encounter for antineoplastic chemotherapy: Secondary | ICD-10-CM | POA: Diagnosis not present

## 2019-02-10 DIAGNOSIS — C221 Intrahepatic bile duct carcinoma: Secondary | ICD-10-CM | POA: Diagnosis not present

## 2019-02-10 DIAGNOSIS — D72829 Elevated white blood cell count, unspecified: Secondary | ICD-10-CM | POA: Insufficient documentation

## 2019-02-10 DIAGNOSIS — D649 Anemia, unspecified: Secondary | ICD-10-CM | POA: Diagnosis not present

## 2019-02-10 DIAGNOSIS — I2510 Atherosclerotic heart disease of native coronary artery without angina pectoris: Secondary | ICD-10-CM | POA: Insufficient documentation

## 2019-02-10 DIAGNOSIS — Z79899 Other long term (current) drug therapy: Secondary | ICD-10-CM | POA: Diagnosis not present

## 2019-02-10 DIAGNOSIS — Z7189 Other specified counseling: Secondary | ICD-10-CM | POA: Diagnosis not present

## 2019-02-10 DIAGNOSIS — I252 Old myocardial infarction: Secondary | ICD-10-CM | POA: Insufficient documentation

## 2019-02-10 DIAGNOSIS — Z87891 Personal history of nicotine dependence: Secondary | ICD-10-CM | POA: Diagnosis not present

## 2019-02-10 DIAGNOSIS — E669 Obesity, unspecified: Secondary | ICD-10-CM | POA: Diagnosis not present

## 2019-02-10 DIAGNOSIS — I119 Hypertensive heart disease without heart failure: Secondary | ICD-10-CM | POA: Insufficient documentation

## 2019-02-10 DIAGNOSIS — Z950 Presence of cardiac pacemaker: Secondary | ICD-10-CM | POA: Insufficient documentation

## 2019-02-11 ENCOUNTER — Other Ambulatory Visit: Payer: Self-pay

## 2019-02-11 ENCOUNTER — Other Ambulatory Visit
Admission: RE | Admit: 2019-02-11 | Discharge: 2019-02-11 | Disposition: A | Discharge status: Home / Self Care | Payer: PPO | Source: Ambulatory Visit | Attending: Oncology | Admitting: Oncology

## 2019-02-11 DIAGNOSIS — Z20828 Contact with and (suspected) exposure to other viral communicable diseases: Secondary | ICD-10-CM | POA: Diagnosis not present

## 2019-02-11 DIAGNOSIS — Z01812 Encounter for preprocedural laboratory examination: Secondary | ICD-10-CM | POA: Diagnosis not present

## 2019-02-11 LAB — SARS CORONAVIRUS 2 (TAT 6-24 HRS): SARS Coronavirus 2: NEGATIVE

## 2019-02-11 MED ORDER — LIDOCAINE-PRILOCAINE 2.5-2.5 % EX CREA: TOPICAL_CREAM | CUTANEOUS | 3 refills | Status: DC

## 2019-02-11 MED ORDER — PROCHLORPERAZINE MALEATE 10 MG PO TABS: 10.0000 mg | ORAL_TABLET | Freq: Four times a day (QID) | ORAL | 2 refills | Status: DC | PRN

## 2019-02-11 MED ORDER — ONDANSETRON HCL 8 MG PO TABS: 8.0000 mg | ORAL_TABLET | Freq: Two times a day (BID) | ORAL | 2 refills | Status: DC | PRN

## 2019-02-11 NOTE — Progress Notes (Signed)
START OFF PATHWAY REGIMEN - Other   [id number]:Gemcitabine + Cisplatin every 28 days:   A cycle is every 28 days:     Gemcitabine      Cisplatin   **Always confirm dose/schedule in your pharmacy ordering system**  Patient Characteristics: Intent of Therapy: Non-Curative / Palliative Intent, Discussed with Patient 

## 2019-02-12 ENCOUNTER — Telehealth: Payer: PPO

## 2019-02-12 ENCOUNTER — Other Ambulatory Visit: Payer: Self-pay | Admitting: Radiology

## 2019-02-12 ENCOUNTER — Inpatient Hospital Stay: Payer: PPO

## 2019-02-12 ENCOUNTER — Inpatient Hospital Stay (HOSPITAL_BASED_OUTPATIENT_CLINIC_OR_DEPARTMENT_OTHER): Payer: PPO | Admitting: Oncology

## 2019-02-12 DIAGNOSIS — C221 Intrahepatic bile duct carcinoma: Secondary | ICD-10-CM | POA: Diagnosis not present

## 2019-02-12 NOTE — Progress Notes (Signed)
[place]  [phone] [phone]  Patient Care Team: Olin Hauser, DO as PCP - General (Family Medicine)   Name of the patient: Jose Dunlap  [id number]  April 27, 1951   Date of visit: 02/12/19  Diagnosis-gallbladder cancer  Virtual Visit via Telephone Note  I connected with Jose Dunlap on 02/12/19 at  1:45 PM EDT by telephone and verified that I am speaking with the correct person using two identifiers.  Location: Patient: Home Provider: Office   I discussed the limitations, risks, security and privacy concerns of performing an evaluation and management service by telephone and the availability of in person appointments. I also discussed with the patient that there may be a patient responsible charge related to this service. The patient expressed understanding and agreed to proceed.  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      Interval history-Jose Dunlap is a 68 year old male who presents to chemo care clinic today for initial meeting in preparation for starting chemotherapy. I introduced the chemo care clinic and we discussed that  the role of the clinic is to assist those who are at an increased risk of emergency room visits and/or complications during the course of chemotherapy treatment. We discussed that the increased risk takes into account factors such as age, performance status, and co-morbidities. We also discussed that for some, this might include barriers to care such as not having a primary care provider, lack of insurance/transportation, or not being able to afford medications. We discussed that the goal of the program is to help prevent unplanned ER visits and help reduce complications during chemotherapy. We do this by discussing specific risk factors to each individual and identifying ways that we can help improve these risk factors and reduce barriers to care.   ECOG FS:2 - Symptomatic, <50% confined to bed  Review of systems- Review of Systems  Constitutional: Positive for malaise/fatigue and weight loss. Negative for chills and fever.  HENT: Negative for congestion, ear pain and tinnitus.   Eyes: Negative.  Negative for blurred vision and double vision.  Respiratory: Negative.  Negative for cough, sputum production and shortness of breath.   Cardiovascular: Negative.  Negative for chest pain, palpitations and leg swelling.  Gastrointestinal: Negative.  Negative for abdominal pain, constipation, diarrhea, nausea and vomiting.  Genitourinary: Negative for dysuria, frequency and urgency.  Musculoskeletal: Negative for back pain and falls.  Skin: Negative.  Negative for rash.  Neurological: Positive for weakness. Negative for headaches.  Endo/Heme/Allergies: Negative.  Does not bruise/bleed easily.  Psychiatric/Behavioral: Negative.  Negative for depression. The patient is not nervous/anxious and does not have insomnia.      Current treatment-beginning treatment with gemcitabine plus  cisplatin  No Known Allergies  Past Medical History:  Diagnosis Date   Essential hypertension    Hyperlipidemia    MI  (myocardial infarction) [facility])    Obesity    Pacemaker    PACER/DEFIBRILLATOR [address] [place] [employer]   Shortness of breath dyspnea     Past Surgical History:  Procedure Laterality Date   ABDOMINAL HERNIA REPAIR  2001   BACK SURGERY  1986   COLONOSCOPY WITH PROPOFOL N/A 05/19/2015   Procedure: COLONOSCOPY WITH PROPOFOL;  Surgeon: Hulen Luster, MD;  Location: [facility] ENDOSCOPY;  Service: Gastroenterology;  Laterality: N/A;   CORONARY ANGIOPLASTY     INSERT / REPLACE / REMOVE PACEMAKER     KNEE ARTHROSCOPY     PACEMAKER PLACEMENT     PROSTATE SURGERY     TONSILLECTOMY      Social History   Socioeconomic History   Marital status: Divorced    Spouse name: Not on file   Number of children: 1   Years of education: Not on file   Highest education level: Not on file  Occupational History   Occupation: retired     Comment: B[employer].   Social Designer, fashion/clothing strain: Not very hard   Food insecurity    Worry: Sometimes true    Inability: Sometimes true   Transportation needs    Medical: No    Non-medical: No  Tobacco Use   Smoking status: Former Smoker   Smokeless tobacco: Former Network engineer and Sexual Activity   Alcohol use: Yes   Drug use: No   Sexual activity: Not on file  Lifestyle   Physical activity    Days per week: Not on file    Minutes per session: Not on file   Stress: Only a little  Relationships   Social connections    Talks on phone: More than three times a week    Gets together: Not on file    Attends religious service: Not on file    Active member of club or organization: Not on file    Attends meetings of clubs or organizations: Not on file    Relationship status: Not on file   Intimate partner violence    Fear of current or ex partner: No    Emotionally abused: No    Physically abused: No    Forced sexual activity: No  Other Topics Concern   Not on file  Social History Narrative   Not on file     No family history on file.   Current Outpatient Medications:    albuterol (VENTOLIN HFA) 108 (90 Base) MCG/ACT inhaler, Inhale 2 puffs into the lungs every 4 (four) hours as needed for wheezing or shortness of breath (cough)., Disp: 8 g, Rfl: 0   ALPRAZolam (XANAX) 0.5 MG tablet, Take 1 tablet (0.5 mg total) by mouth at bedtime as needed for anxiety., Disp: 30 tablet, Rfl: 1   aspirin 81 MG tablet, Chew 81 mg by mouth daily., Disp: , Rfl:    benzonatate (TESSALON) 100 MG capsule, Take 1 capsule (100 mg total) by mouth 3 (three) times daily as needed for cough., Disp: 90 capsule, Rfl: 0   Cholecalciferol (D 1000) 1000 units capsule, Take 1 capsule by mouth daily., Disp: , Rfl:    clopidogrel (PLAVIX) 75 MG tablet, Take 1 tablet (75 mg total) by mouth daily., Disp: 90 tablet, Rfl: 1   fluticasone (FLONASE) 50 MCG/ACT nasal spray, Place 2 sprays into  both nostrils daily. (Patient not taking: Reported on 01/20/2019), Disp: 16 g, Rfl: 6   Icosapent Ethyl (VASCEPA) 1 g CAPS, Take 2 g by mouth 2 (two) times daily. (Patient not taking: Reported on 01/20/2019), Disp: 120 capsule, Rfl: 5   lidocaine-prilocaine (EMLA) cream, Apply to affected area once, Disp: 30 g, Rfl: 3   Magnesium Cl-Calcium Carbonate (SLOW MAGNESIUM/CALCIUM) 70-117 MG TBEC, Take 1 tablet by mouth daily. , Disp: , Rfl:    Multiple Vitamin (MULTIVITAMIN) capsule, Take 1 capsule by mouth daily., Disp: , Rfl:    nebivolol (BYSTOLIC) 5 MG tablet, Take 1 tablet (5 mg total) by mouth daily., Disp: 90 tablet, Rfl: 1   nitroGLYCERIN (NITROSTAT) 0.4 MG SL tablet, Place 1 tablet (0.4 mg total) under the tongue as needed., Disp: 25 tablet, Rfl: 4   Omega-3 Fatty Acids (FISH OIL PO), Take 1 capsule by mouth 2 (two) times daily. , Disp: , Rfl:    ondansetron (ZOFRAN) 8 MG tablet, Take 1 tablet (8 mg total) by mouth 2 (two) times daily as needed., Disp: 30 tablet, Rfl: 2   prochlorperazine (COMPAZINE) 10 MG tablet, Take 1 tablet (10  mg total) by mouth every 6 (six) hours as needed (Nausea or vomiting)., Disp: 60 tablet, Rfl: 2   vitamin B-12 (CYANOCOBALAMIN) 250 MCG tablet, Take 1 tablet by mouth daily., Disp: , Rfl:   Physical exam: There were no vitals filed for this visit. Physical Exam Neurological:     Mental Status: He is alert.     Limited d/t telephone visit.   CMP Latest Ref Rng & Units 01/14/2019  Glucose 70 - 99 mg/dL 119(H)  BUN 8 - 23 mg/dL 10  Creatinine 0.61 - 1.24 mg/dL 0.88  Sodium 135 - 145 mmol/L 134(L)  Potassium 3.5 - 5.1 mmol/L 4.3  Chloride 98 - 111 mmol/L 99  CO2 22 - 32 mmol/L 22  Calcium 8.9 - 10.3 mg/dL 8.6(L)  Total Protein 6.5 - 8.1 g/dL -  Total Bilirubin 0.3 - 1.2 mg/dL -  Alkaline Phos 38 - 126 U/L -  AST 15 - 41 U/L -  ALT 0 - 44 U/L -   CBC Latest Ref Rng & Units 01/27/2019  WBC 4.0 - 10.5 K/uL 33.5(H)  Hemoglobin 13.0 - 17.0 g/dL 10.1(L)  Hematocrit 39.0 - 52.0 % 30.6(L)  Platelets 150 - 400 K/uL 392    No images are attached to the encounter.  Ct Abdomen Pelvis W Contrast  Result Date: 01/14/2019 CLINICAL DATA:  Liver lesion EXAM: CT ABDOMEN AND PELVIS WITH CONTRAST TECHNIQUE: Multidetector CT imaging of the abdomen and pelvis was performed using the standard protocol following bolus administration of intravenous contrast. CONTRAST:  146mL OMNIPAQUE IOHEXOL 300 MG/ML  SOLN COMPARISON:  Liver ultrasound, 01/12/2019 FINDINGS: Lower chest: No acute abnormality. Extensive 3 vessel coronary artery calcifications and/or stents, and pacer leads. Hepatobiliary: Hepatomegaly, maximum coronal span 22.3 cm. There are innumerable, small hypodense lesions of liver parenchyma. There is ill-defined, somewhat masslike enlargement of the caudate lobe and adjacent segment IV (series 2, image 32), which in total measures approximately 8.2 x 6.0 cm (series 2, image 29). No gallstones, gallbladder wall thickening, or biliary dilatation. Pancreas: Unremarkable. No pancreatic ductal dilatation or  surrounding inflammatory changes. Spleen: Normal in size without significant abnormality. Adrenals/Urinary Tract: Adrenal glands are unremarkable. Kidneys are normal, without renal calculi, solid lesion, or hydronephrosis. 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  Appendix appears normal. No evidence of bowel wall thickening, distention, or inflammatory changes. Vascular/Lymphatic: Aortic atherosclerosis. There are abnormally enlarged gastrohepatic, celiac axis, and left retroperitoneal nodes, largest nodes measuring approximately 1.8 cm (series 2, image 38). Reproductive: Prostatomegaly with TURP defect. Other: No abdominal wall hernia or abnormality. No abdominopelvic ascites. Musculoskeletal: No acute or significant osseous findings. IMPRESSION: 1. Hepatomegaly with innumerable, small hypodense lesions of the liver parenchyma, most consistent with metastatic disease, as queried on prior ultrasound examination. 2. There is no definite primary mass noted within the abdomen or pelvis, however there is ill-defined, somewhat masslike enlargement of the caudate lobe and adjacent segment IV (series 2, image 32), which in total measures approximately 8.2 x 6.0 cm (series 2, image 29). This could represent a primary hepatocellular carcinoma. This lesion is ideally best characterized by multiphasic contrast enhanced MRI. 3. There are abnormally enlarged gastrohepatic, celiac axis, and left retroperitoneal nodes, largest nodes measuring approximately 1.8 cm (series 2, image 38), concerning for nodal metastatic disease. Electronically Signed   By: Eddie Candle M.D.   On: 01/14/2019 14:08   Nm Pet Image Initial (pi) Skull Base To Thigh  Result Date: 01/26/2019 CLINICAL DATA:  Initial treatment strategy for liver masses and retroperitoneal adenopathy. EXAM: NUCLEAR MEDICINE PET SKULL BASE TO THIGH TECHNIQUE: 10.9 mCi  F-18 FDG was injected intravenously. Full-ring PET imaging was performed from the skull base to thigh after the radiotracer. CT data was obtained and used for attenuation correction and anatomic localization. Fasting blood glucose: 102 mg/dl COMPARISON:  Multiple exams, including CT from 01/14/2019 and ultrasound from 01/12/2019 FINDINGS: Mediastinal blood pool activity: SUV max 2.5 Liver activity: SUV max NA NECK: 0.8 cm left level IV lymph node on image 69/3 with maximum SUV 3.7. Incidental CT findings: Bilateral common carotid atherosclerotic calcification. CHEST: No significant abnormal hypermetabolic activity in this region. Incidental CT findings: Mild cardiomegaly. Pacer/AICD. No hypermetabolic or pathologically enlarged adenopathy in the chest. ABDOMEN/PELVIS: Hypermetabolic scattered hepatic activity corresponding to the innumerable hepatic nodule shown on recent PET-CT. The confluent mass involving the caudate lobe of the liver has a maximum SUV of 11.1. Multifocal hypermetabolic activity in segment 4b of the liver has maximum SUV of 8.4. Other areas of confluence of hypermetabolic activity include the dome of segment 2 laterally in segment 4a of the liver, but there scattered hypermetabolic nodules throughout the liver. Just below the caudate lobe a hypermetabolic lymph node measuring approximately 2.6 cm in short axis on image 173/3 has a maximum SUV of 10.1. Additional hypermetabolic porta hepatis and retroperitoneal lymph nodes are present. An index left periaortic lymph node measuring 1.2 cm in short axis on image 176/3 has maximum SUV of 6.6. Small hypermetabolic peripancreatic lymph nodes are present. I do not observe a definite hypermetabolic pancreatic mass. There is some accentuated metabolic activity throughout much of the ascending colon, without CT correlate. No hypermetabolic small bowel mass is identified. There is some accentuated activity in the vicinity of the anus, maximum SUV 7.7.  Incidental CT findings: Scalloped contour of the left mid to lower kidney posteriorly with underlying mild precontrast accentuated density, as on image 193/3. This may be from localized scarring although the hyperdense appearance is slightly unusual. I do not observe abnormal accentuated metabolic activity in this vicinity compared to the rest of kidney. Aortoiliac atherosclerotic vascular disease. SKELETON: Diffuse mildly accentuated activity throughout the skeleton with some mild heterogeneity but without CT correlate. Representative activity in the left upper sacral ala maximum SUV 6.3. I do  not see obvious asymmetry. Incidental CT findings: none IMPRESSION: 1. Hypermetabolic masses are scattered in the liver, with the largest/most confluent in the caudate lobe with maximum SUV of 11.1. 2. There is adjacent hypermetabolic porta hepatis and retroperitoneal adenopathy. 3. I do not identify a definite separate primary. There is some accentuated activity throughout the ascending colon but without CT correlate, this probably physiologic. There is also some localized activity at the anus, probably physiologic, although correlation with digital rectal exam is suggested. The lack of a definite separate primary raises the possibility of a primary liver lesion such as cholangiocarcinoma or hepatocellular carcinoma. 4. Outside of the abdomen, note is made of a 0.8 cm left neck level IV lymph node with maximum SUV of 3.7 which is mildly above the blood pool. This lesion is nonspecific but a metastatic lesion is not totally excluded. 5. There is diffuse accentuated activity throughout the skeleton with some mild heterogeneity but without a CT correlate or significant asymmetry. Electronically Signed   By: Van Clines M.D.   On: 01/26/2019 13:11   US Biopsy (liver)  Result Date: 01/27/2019 INDICATION: Multiple liver lesions throughout the liver with no current known source for metastatic disease. The patient  presents for biopsy of the liver. EXAM: ULTRASOUND GUIDED CORE BIOPSY OF LIVER MEDICATIONS: None. ANESTHESIA/SEDATION: Fentanyl 75 mcg IV; Versed 2.0 mg IV Moderate Sedation Time:  13 minutes. The patient was continuously monitored during the procedure by the interventional radiology nurse under my direct supervision. PROCEDURE: The procedure, risks, benefits, and alternatives were explained to the patient. Questions regarding the procedure were encouraged and answered. The patient understands and consents to the procedure. A time-out was performed prior to initiating the procedure. Ultrasound was initially performed of the liver to localize lesions. The abdominal wall was prepped with chlorhexidine in a sterile fashion, and a sterile drape was applied covering the operative field. A sterile gown and sterile gloves were used for the procedure. Local anesthesia was provided with 1% Lidocaine. Under ultrasound guidance, a 17 gauge trocar needle was advanced into the left lobe of the liver. Three separate 18 gauge core biopsy samples were then obtained under ultrasound guidance with an automated device. Core biopsy samples were submitted in formalin. Cut Gel-Foam pledgets were advanced through the outer needle as the needle was retracted. Additional ultrasound was performed. COMPLICATIONS: None immediate. FINDINGS: Innumerable hypoechoic lesions are seen throughout the liver parenchyma. Adjacent lesions were chosen in the medial segment of the left lobe of the liver with the largest measuring approximately 1.6 cm. Solid tissue was obtained. IMPRESSION: Ultrasound-guided core biopsy performed of lesions in the left lobe of the liver. Electronically Signed   By: Aletta Edouard M.D.   On: 01/27/2019 10:44     Assessment and plan- Patient is a 68 y.o. male who presents to [facility] for initial meeting in preparation for starting chemotherapy for the treatment of metastatic cholangiocarcinoma.  Patient with  recent diagnosis after presenting to the emergency room for cough and dizziness.  Work-up included CT chest and lab work revealing elevated LFTs and leukocytosis.  Had an ultrasound of gallbladder revealing concerns for cancer.  GI was consulted and MRI of his liver and gallbladder were ordered.  Appropriate labs including CEA, AFP and CA-19-9 were ordered.  Oncology hematology was consulted.  MRI revealed innumerable hypodense lesions in the liver.  Ordered PET scan which revealed hypermetabolic masses throughout the liver as well as retroperitoneal lymphadenopathy.  Biopsy performed and results are still  pending.  Plan is to begin chemotherapy next week with gemcitabine and cisplatin.  He is having his port placed tomorrow.   1. Cancer-cholangiocarcinoma.  He was evaluated yesterday by Dr. Grayland Ormond to discuss final pathology and treatment planning.  He complained of feeling weak and his performance status was declining.  He continued to have a poor appetite and lose weight.  He did not complain of pain.  Patient wishes to pursue palliative chemotherapy.  He is having a port placed on Monday and he will return on 02/17/2019 to initiate cycle 1 of cisplatin/gemcitabine.   2. Chemo Care Clinic/High Risk for ER/Hospitalization during chemotherapy- We discussed the role of the chemo care clinic and identified patient specific risk factors. I discussed that patient was identified as high risk primarily based on stage of disease and performance status that is steadily declining.  Patient does have a primary care doctor and sees him regularly.  He was last assessed for hospital follow-up on 01/20/2019.  He has a past medical history of hypertension, hyperlipidemia, obesity, pacemaker and chronic shortness of breath.  3. Social Determinants of Health- we discussed that social determinants of health may have significant impacts on health and outcomes for cancer patients.  Today we discussed specific social determinants  of performance status, alcohol use, depression, financial needs, food insecurity, housing, interpersonal violence, social connections, stress, tobacco use, and transportation.  After lengthy discussion the following were identified as areas of need: Based on performance status/activity level we discussed options including home based and outpatient services, DME, and CARE program. We discusssed that patients who participate in regular physical activity report fewer negative impacts of cancer and treatments and report less fatigue.  Patient did express some concerns about anxiety and stress. We discussed options for managing stress including healthy eating, exercise as well as participating in no charge counseling services at the cancer center and support groups.  If these are of interest, patient can notify either myself or primary nursing team.  He has reliable transportation and will be able to drive himself to and from his appointments.  He lives at home alone but has a son that lives nearby.  He is not married.  4. Co-morbidities Complicating Care: He has history of hypertension, hyperlipidemia, obesity, pacemaker and chronic shortness of breath.  5. Palliative Care- based on stage of cancer and/or identified needs today, I will refer patient to palliative care for goals of care and advanced care planning.  We also discussed the role of the Symptom Management Clinic at [facility] for acute issues and methods of contacting clinic/provider. He denies needing specific assistance at this time and He will be followed by Mariea Clonts, RN (Nurse Navigator).    Visit Diagnosis 1. 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 The patient was provided an opportunity to ask questions and all were answered. The patient agreed with the plan and demonstrated an understanding of the instructions.   The patient was advised to call back or seek an in-person  evaluation if the symptoms worsen or if the condition fails to improve as anticipated.  I provided 15 minutes of non-face-to-face time during this encounter.  Faythe Casa,  NP, AGNP-C [facility] (work cell) [phone] (office)  CC: Dr. Grayland Ormond

## 2019-02-13 ENCOUNTER — Other Ambulatory Visit: Payer: Self-pay

## 2019-02-13 ENCOUNTER — Ambulatory Visit
Admission: RE | Admit: 2019-02-13 | Discharge: 2019-02-13 | Disposition: A | Discharge status: Home / Self Care | Payer: PPO | Source: Ambulatory Visit | Attending: Oncology | Admitting: Oncology

## 2019-02-13 DIAGNOSIS — C23 Malignant neoplasm of gallbladder: Secondary | ICD-10-CM | POA: Diagnosis not present

## 2019-02-13 DIAGNOSIS — E669 Obesity, unspecified: Secondary | ICD-10-CM | POA: Insufficient documentation

## 2019-02-13 DIAGNOSIS — Z87891 Personal history of nicotine dependence: Secondary | ICD-10-CM | POA: Insufficient documentation

## 2019-02-13 DIAGNOSIS — Z7902 Long term (current) use of antithrombotics/antiplatelets: Secondary | ICD-10-CM | POA: Insufficient documentation

## 2019-02-13 DIAGNOSIS — Z5111 Encounter for antineoplastic chemotherapy: Secondary | ICD-10-CM | POA: Diagnosis not present

## 2019-02-13 DIAGNOSIS — Z79899 Other long term (current) drug therapy: Secondary | ICD-10-CM | POA: Insufficient documentation

## 2019-02-13 DIAGNOSIS — Z6826 Body mass index (BMI) 26.0-26.9, adult: Secondary | ICD-10-CM | POA: Diagnosis not present

## 2019-02-13 DIAGNOSIS — Z7982 Long term (current) use of aspirin: Secondary | ICD-10-CM | POA: Diagnosis not present

## 2019-02-13 DIAGNOSIS — Z950 Presence of cardiac pacemaker: Secondary | ICD-10-CM | POA: Insufficient documentation

## 2019-02-13 DIAGNOSIS — I10 Essential (primary) hypertension: Secondary | ICD-10-CM | POA: Diagnosis not present

## 2019-02-13 DIAGNOSIS — E785 Hyperlipidemia, unspecified: Secondary | ICD-10-CM | POA: Insufficient documentation

## 2019-02-13 DIAGNOSIS — I252 Old myocardial infarction: Secondary | ICD-10-CM | POA: Insufficient documentation

## 2019-02-13 DIAGNOSIS — R160 Hepatomegaly, not elsewhere classified: Secondary | ICD-10-CM | POA: Diagnosis not present

## 2019-02-13 HISTORY — PX: IR IMAGING GUIDED PORT INSERTION: IMG5740

## 2019-02-13 LAB — BASIC METABOLIC PANEL
Anion gap: 14 (ref 5–15)
BUN: 12 mg/dL (ref 8–23)
CO2: 23 mmol/L (ref 22–32)
Calcium: 9 mg/dL (ref 8.9–10.3)
Chloride: 95 mmol/L — ABNORMAL LOW (ref 98–111)
Creatinine, Ser: 0.71 mg/dL (ref 0.61–1.24)
GFR calc Af Amer: >60 mL/min (ref >60)
GFR calc non Af Amer: >60 mL/min (ref >60)
Glucose, Bld: 120 mg/dL — ABNORMAL HIGH (ref 70–99)
Potassium: 4.2 mmol/L (ref 3.5–5.1)
Sodium: 132 mmol/L — ABNORMAL LOW (ref 135–145)

## 2019-02-13 LAB — CBC
HCT: 32.2 % — ABNORMAL LOW (ref 39.0–52.0)
Hemoglobin: 10.4 g/dL — ABNORMAL LOW (ref 13.0–17.0)
MCH: 24.3 pg — ABNORMAL LOW (ref 26.0–34.0)
MCHC: 32.3 g/dL (ref 30.0–36.0)
MCV: 75.2 fL — ABNORMAL LOW (ref 80.0–100.0)
Platelets: 322 10*3/uL (ref 150–400)
RBC: 4.28 MIL/uL (ref 4.22–5.81)
RDW: 17 % — ABNORMAL HIGH (ref 11.5–15.5)
WBC: 36.1 10*3/uL — ABNORMAL HIGH (ref 4.0–10.5)
nRBC: 0 % (ref 0.0–0.2)

## 2019-02-13 LAB — PROTIME-INR
INR: 1.3 — ABNORMAL HIGH (ref 0.8–1.2)
Prothrombin Time: 15.8 seconds — ABNORMAL HIGH (ref 11.4–15.2)

## 2019-02-13 MED ORDER — FENTANYL CITRATE (PF) 100 MCG/2ML IJ SOLN
INTRAMUSCULAR | Status: AC
  Filled 2019-02-13: qty 4

## 2019-02-13 MED ORDER — SODIUM CHLORIDE 0.9 % IV SOLN
INTRAVENOUS | Status: DC
  Administered 2019-02-13: 08:00:00 via INTRAVENOUS

## 2019-02-13 MED ORDER — MIDAZOLAM HCL 5 MG/5ML IJ SOLN
INTRAMUSCULAR | Status: AC | PRN
  Administered 2019-02-13 (×3): 1 mg via INTRAVENOUS

## 2019-02-13 MED ORDER — MIDAZOLAM HCL 5 MG/5ML IJ SOLN
INTRAMUSCULAR | Status: AC
  Filled 2019-02-13: qty 5

## 2019-02-13 MED ORDER — FENTANYL CITRATE (PF) 100 MCG/2ML IJ SOLN
INTRAMUSCULAR | Status: AC | PRN
  Administered 2019-02-13 (×2): 25 ug via INTRAVENOUS
  Administered 2019-02-13: 50 ug via INTRAVENOUS

## 2019-02-13 MED ORDER — CEFAZOLIN SODIUM-DEXTROSE 2-4 GM/100ML-% IV SOLN
2.0000 g | INTRAVENOUS | Status: AC
  Administered 2019-02-13: 09:00:00 2 g via INTRAVENOUS

## 2019-02-13 MED ORDER — LIDOCAINE-EPINEPHRINE (PF) 1 %-1:200000 IJ SOLN
INTRAMUSCULAR | Status: AC | PRN
  Administered 2019-02-13: 10 mL

## 2019-02-13 NOTE — Procedures (Signed)
Pre Procedure Dx: Poor venous acess Post Procedural Dx: Same  Successful placement of right IJ approach port-a-cath with tip at the superior caval atrial junction. The catheter is ready for immediate use.  Estimated Blood Loss: Trace  Complications: None immediate.  Ronny Bacon, MD Pager #: [phone]

## 2019-02-13 NOTE — Consult Note (Signed)
Chief Complaint: Poor venous access  Referring Physician(s): Finnegan,Timothy J  Patient Status: [facility] - Out-pt  History of Present Illness: Jose Dunlap is a 68 y.o. 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  Patient complains of worsening fatigue and baseline shortness of breath and abdominal pain.  Patient denies chest pain, fever or chills.   Past Medical History:  Diagnosis Date   Essential hypertension    Hyperlipidemia    Liver cancer ([place])    MI (myocardial infarction) [facility])    Obesity    Pacemaker    PACER/DEFIBRILLATOR [address], [place] [employer]   Shortness of breath dyspnea     Past Surgical History:  Procedure Laterality Date   ABDOMINAL HERNIA REPAIR  2001   BACK SURGERY  1986   COLONOSCOPY WITH PROPOFOL N/A 05/19/2015   Procedure: COLONOSCOPY WITH PROPOFOL;  Surgeon: Hulen Luster, MD;  Location: [facility] ENDOSCOPY;  Service: Gastroenterology;  Laterality: N/A;   CORONARY ANGIOPLASTY     INSERT / REPLACE / REMOVE PACEMAKER     KNEE ARTHROSCOPY     PACEMAKER PLACEMENT     PROSTATE SURGERY     TONSILLECTOMY      Allergies: Patient has no known allergies.  Medications: Prior to Admission medications   Medication Sig Start Date End Date Taking? Authorizing Provider  albuterol (VENTOLIN HFA) 108 (90 Base) MCG/ACT inhaler Inhale 2 puffs into the lungs every 4 (four) hours as needed for wheezing or shortness of breath (cough). 12/30/18  Yes Karamalegos, Devonne Doughty, DO  ALPRAZolam Duanne Moron) 0.5 MG tablet Take 1 tablet (0.5 mg total) by mouth at bedtime as needed for anxiety. 02/03/19  Yes Lloyd Huger, MD  clopidogrel (PLAVIX) 75 MG tablet Take 1 tablet (75 mg total) by mouth daily. 12/30/18  Yes  Karamalegos, Devonne Doughty, DO  lidocaine-prilocaine (EMLA) cream Apply to affected area once 02/11/19  Yes Lloyd Huger, MD  Multiple Vitamin (MULTIVITAMIN) capsule Take 1 capsule by mouth daily.   Yes [provider]  nebivolol (BYSTOLIC) 5 MG tablet Take 1 tablet (5 mg total) by mouth daily. 12/30/18  Yes Karamalegos, Devonne Doughty, DO  nitroGLYCERIN (NITROSTAT) 0.4 MG SL tablet Place 1 tablet (0.4 mg total) under the tongue as needed. 12/30/18  Yes Karamalegos, Alexander J, DO  ondansetron (ZOFRAN) 8 MG tablet Take 1 tablet (8 mg total) by mouth 2 (two) times daily as needed. 02/11/19  Yes Lloyd Huger, MD  prochlorperazine (COMPAZINE) 10 MG tablet Take 1 tablet (10 mg total) by mouth every 6 (six) hours as needed (Nausea or vomiting). 02/11/19  Yes Lloyd Huger, MD  vitamin B-12 (CYANOCOBALAMIN) 250 MCG tablet Take 1 tablet by mouth daily.   Yes [provider]  aspirin 81 MG tablet Chew 81 mg by mouth daily. 03/24/14   [provider]  benzonatate (TESSALON) 100 MG capsule Take 1 capsule (100 mg total) by mouth 3 (three) times daily as needed for cough. Patient not taking: Reported on 02/13/2019 02/03/19   Lloyd Huger, MD  Cholecalciferol (D 1000) 1000 units capsule Take 1 capsule by mouth daily.    [provider]  fluticasone (FLONASE) 50 MCG/ACT nasal spray Place 2 sprays into both nostrils daily. Patient not taking: Reported on 01/20/2019 09/24/18   Sharion Balloon, FNP  Icosapent Ethyl (VASCEPA) 1 g  CAPS Take 2 g by mouth 2 (two) times daily. Patient not taking: Reported on 01/20/2019 08/13/17   Olin Hauser, DO  Magnesium Cl-Calcium Carbonate (SLOW MAGNESIUM/CALCIUM) 70-117 MG TBEC Take 1 tablet by mouth daily.     [provider]  Omega-3 Fatty Acids (FISH OIL PO) Take 1 capsule by mouth 2 (two) times daily.     [provider]     History reviewed. No pertinent family history.  Social History    Socioeconomic History   Marital status: Divorced    Spouse name: Not on file   Number of children: 1   Years of education: Not on file   Highest education level: Not on file  Occupational History   Occupation: retired     Comment: [employer].   Social Designer, fashion/clothing strain: Not very hard   Food insecurity    Worry: Sometimes true    Inability: Sometimes true   Transportation needs    Medical: No    Non-medical: No  Tobacco Use   Smoking status: Former Smoker   Smokeless tobacco: Former Network engineer and Sexual Activity   Alcohol use: Not Currently   Drug use: No   Sexual activity: Not on file  Lifestyle   Physical activity    Days per week: Not on file    Minutes per session: Not on file   Stress: Only a little  Relationships   Social connections    Talks on phone: More than three times a week    Gets together: Not on file    Attends religious service: Not on file    Active member of club or organization: Not on file    Attends meetings of clubs or organizations: Not on file    Relationship status: Not on file  Other Topics Concern   Not on file  Social History Narrative   Not on file    ECOG Status: 2 - Symptomatic, <50% confined to bed  Review of Systems: A 12 point ROS discussed and pertinent positives are indicated in the HPI above.  All other systems are negative.  Review of Systems  Constitutional: Positive for activity change and fatigue. Negative for chills and fever.  Respiratory: Positive for shortness of breath. Negative for cough.   Cardiovascular: Negative.   Gastrointestinal: Positive for abdominal pain.    Vital Signs: BP (!) 127/94    Pulse (!) 106    Temp 98.7 F (37.1 C) (Oral)    Resp (!) 22    Ht 6' (1.829 m)    Wt 89.4 kg    SpO2 97%    BMI 26.72 kg/m   Physical Exam Vitals signs and nursing note reviewed.  Constitutional:      Appearance: Normal appearance.  Cardiovascular:     Rate and Rhythm:  Normal rate and regular rhythm.  Pulmonary:     Effort: Pulmonary effort is normal.     Breath sounds: Normal breath sounds.  Neurological:     Mental Status: He is alert.  Psychiatric:     Comments: Patient appears anxious and somewhat depressed.      Imaging: Ct Abdomen Pelvis W Contrast  Result Date: 01/14/2019 CLINICAL DATA:  Liver lesion EXAM: CT ABDOMEN AND PELVIS WITH CONTRAST TECHNIQUE: Multidetector CT imaging of the abdomen and pelvis was performed using the standard protocol following bolus administration of intravenous contrast. CONTRAST:  187mL OMNIPAQUE IOHEXOL 300 MG/ML  SOLN COMPARISON:  Liver ultrasound, 01/12/2019 FINDINGS: Lower  chest: No acute abnormality. Extensive 3 vessel coronary artery calcifications and/or stents, and pacer leads. Hepatobiliary: Hepatomegaly, maximum coronal span 22.3 cm. There are innumerable, small hypodense lesions of liver parenchyma. There is ill-defined, somewhat masslike enlargement of the caudate lobe and adjacent segment IV (series 2, image 32), which in total measures approximately 8.2 x 6.0 cm (series 2, image 29). No gallstones, gallbladder wall thickening, or biliary dilatation. Pancreas: Unremarkable. No pancreatic ductal dilatation or surrounding inflammatory changes. Spleen: Normal in size without significant abnormality. Adrenals/Urinary Tract: Adrenal glands are unremarkable. Kidneys are normal, without renal calculi, solid lesion, or hydronephrosis. 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 Appendix appears normal. No evidence of bowel wall thickening, distention, or inflammatory changes. Vascular/Lymphatic: Aortic atherosclerosis. There are abnormally enlarged gastrohepatic, celiac axis, and left retroperitoneal nodes, largest nodes measuring approximately 1.8 cm (series 2, image 38). Reproductive: Prostatomegaly with TURP defect. Other:  No abdominal wall hernia or abnormality. No abdominopelvic ascites. Musculoskeletal: No acute or significant osseous findings. IMPRESSION: 1. Hepatomegaly with innumerable, small hypodense lesions of the liver parenchyma, most consistent with metastatic disease, as queried on prior ultrasound examination. 2. There is no definite primary mass noted within the abdomen or pelvis, however there is ill-defined, somewhat masslike enlargement of the caudate lobe and adjacent segment IV (series 2, image 32), which in total measures approximately 8.2 x 6.0 cm (series 2, image 29). This could represent a primary hepatocellular carcinoma. This lesion is ideally best characterized by multiphasic contrast enhanced MRI. 3. There are abnormally enlarged gastrohepatic, celiac axis, and left retroperitoneal nodes, largest nodes measuring approximately 1.8 cm (series 2, image 38), concerning for nodal metastatic disease. Electronically Signed   By: Eddie Candle M.D.   On: 01/14/2019 14:08   Nm Pet Image Initial (pi) Skull Base To Thigh  Result Date: 01/26/2019 CLINICAL DATA:  Initial treatment strategy for liver masses and retroperitoneal adenopathy. EXAM: NUCLEAR MEDICINE PET SKULL BASE TO THIGH TECHNIQUE: 10.9 mCi F-18 FDG was injected intravenously. Full-ring PET imaging was performed from the skull base to thigh after the radiotracer. CT data was obtained and used for attenuation correction and anatomic localization. Fasting blood glucose: 102 mg/dl COMPARISON:  Multiple exams, including CT from 01/14/2019 and ultrasound from 01/12/2019 FINDINGS: Mediastinal blood pool activity: SUV max 2.5 Liver activity: SUV max NA NECK: 0.8 cm left level IV lymph node on image 69/3 with maximum SUV 3.7. Incidental CT findings: Bilateral common carotid atherosclerotic calcification. CHEST: No significant abnormal hypermetabolic activity in this region. Incidental CT findings: Mild cardiomegaly. Pacer/AICD. No hypermetabolic or  pathologically enlarged adenopathy in the chest. ABDOMEN/PELVIS: Hypermetabolic scattered hepatic activity corresponding to the innumerable hepatic nodule shown on recent PET-CT. The confluent mass involving the caudate lobe of the liver has a maximum SUV of 11.1. Multifocal hypermetabolic activity in segment 4b of the liver has maximum SUV of 8.4. Other areas of confluence of hypermetabolic activity include the dome of segment 2 laterally in segment 4a of the liver, but there scattered hypermetabolic nodules throughout the liver. Just below the caudate lobe a hypermetabolic lymph node measuring approximately 2.6 cm in short axis on image 173/3 has a maximum SUV of 10.1. Additional hypermetabolic porta hepatis and retroperitoneal lymph nodes are present. An index left periaortic lymph node measuring 1.2 cm in short axis on image 176/3 has maximum SUV of 6.6. Small hypermetabolic peripancreatic lymph nodes are present. I do not observe a definite hypermetabolic  pancreatic mass. There is some accentuated metabolic activity throughout much of the ascending colon, without CT correlate. No hypermetabolic small bowel mass is identified. There is some accentuated activity in the vicinity of the anus, maximum SUV 7.7. Incidental CT findings: Scalloped contour of the left mid to lower kidney posteriorly with underlying mild precontrast accentuated density, as on image 193/3. This may be from localized scarring although the hyperdense appearance is slightly unusual. I do not observe abnormal accentuated metabolic activity in this vicinity compared to the rest of kidney. Aortoiliac atherosclerotic vascular disease. SKELETON: Diffuse mildly accentuated activity throughout the skeleton with some mild heterogeneity but without CT correlate. Representative activity in the left upper sacral ala maximum SUV 6.3. I do not see obvious asymmetry. Incidental CT findings: none IMPRESSION: 1. Hypermetabolic masses are scattered in the  liver, with the largest/most confluent in the caudate lobe with maximum SUV of 11.1. 2. There is adjacent hypermetabolic porta hepatis and retroperitoneal adenopathy. 3. I do not identify a definite separate primary. There is some accentuated activity throughout the ascending colon but without CT correlate, this probably physiologic. There is also some localized activity at the anus, probably physiologic, although correlation with digital rectal exam is suggested. The lack of a definite separate primary raises the possibility of a primary liver lesion such as cholangiocarcinoma or hepatocellular carcinoma. 4. Outside of the abdomen, note is made of a 0.8 cm left neck level IV lymph node with maximum SUV of 3.7 which is mildly above the blood pool. This lesion is nonspecific but a metastatic lesion is not totally excluded. 5. There is diffuse accentuated activity throughout the skeleton with some mild heterogeneity but without a CT correlate or significant asymmetry. Electronically Signed   By: Van Clines M.D.   On: 01/26/2019 13:11   US Biopsy (liver)  Result Date: 01/27/2019 INDICATION: Multiple liver lesions throughout the liver with no current known source for metastatic disease. The patient presents for biopsy of the liver. EXAM: ULTRASOUND GUIDED CORE BIOPSY OF LIVER MEDICATIONS: None. ANESTHESIA/SEDATION: Fentanyl 75 mcg IV; Versed 2.0 mg IV Moderate Sedation Time:  13 minutes. The patient was continuously monitored during the procedure by the interventional radiology nurse under my direct supervision. PROCEDURE: The procedure, risks, benefits, and alternatives were explained to the patient. Questions regarding the procedure were encouraged and answered. The patient understands and consents to the procedure. A time-out was performed prior to initiating the procedure. Ultrasound was initially performed of the liver to localize lesions. The abdominal wall was prepped with chlorhexidine in a sterile  fashion, and a sterile drape was applied covering the operative field. A sterile gown and sterile gloves were used for the procedure. Local anesthesia was provided with 1% Lidocaine. Under ultrasound guidance, a 17 gauge trocar needle was advanced into the left lobe of the liver. Three separate 18 gauge core biopsy samples were then obtained under ultrasound guidance with an automated device. Core biopsy samples were submitted in formalin. Cut Gel-Foam pledgets were advanced through the outer needle as the needle was retracted. Additional ultrasound was performed. COMPLICATIONS: None immediate. FINDINGS: Innumerable hypoechoic lesions are seen throughout the liver parenchyma. Adjacent lesions were chosen in the medial segment of the left lobe of the liver with the largest measuring approximately 1.6 cm. Solid tissue was obtained. IMPRESSION: Ultrasound-guided core biopsy performed of lesions in the left lobe of the liver. Electronically Signed   By: Aletta Edouard M.D.   On: 01/27/2019 10:44    Labs:  CBC: Recent  Labs    01/13/19 0549 01/14/19 0557 01/27/19 0748 02/13/19 0808  WBC 20.2* 20.3* 33.5* 36.1*  HGB 10.0* 10.4* 10.1* 10.4*  HCT 30.8* 31.5* 30.6* 32.2*  PLT 276 326 392 322    COAGS: Recent Labs    01/27/19 0748  INR 1.3*    BMP: Recent Labs    01/12/19 0947 01/12/19 1651 01/13/19 0549 01/14/19 0557  NA 134* 136 135 134*  K 4.9 5.1 4.5 4.3  CL 98 100 101 99  CO2 25 24 24 22   GLUCOSE 172* 106* 119* 119*  BUN 16 14 13 10   CALCIUM 8.9 8.9 8.2* 8.6*  CREATININE 0.90 0.86 0.87 0.88  GFRNONAA >60 >60 >60 >60  GFRAA >60 >60 >60 >60    LIVER FUNCTION TESTS: Recent Labs    01/12/19 0947 01/12/19 1651  BILITOT 1.1 1.1  AST 53* 45*  ALT 75* 69*  ALKPHOS 532* 524*  PROT 7.5 7.6  ALBUMIN 2.9* 3.1*    TUMOR MARKERS: No results for input(s): AFPTM, CEA, CA199, CHROMGRNA in the last 8760 hours.  Assessment and Plan:  Jose Dunlap is a 68 y.o. 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  Risks and benefits of image guided port-a-catheter placement was discussed with the patient including, but not limited to bleeding, infection, pneumothorax, or fibrin sheath development and need for additional procedures.  All of the patient's questions were answered, patient is agreeable to proceed.  Consent signed and in chart.  Thank you for this interesting consult.  I greatly enjoyed meeting Jose Dunlap and look forward to participating in their care.  A copy of this report was sent to the requesting provider on this date.  Electronically Signed: Sandi Mariscal, MD 02/13/2019, 8:39 AM   I spent a total of 15 Minutes in face to face in clinical consultation, greater than 50% of which was counseling/coordinating care for port placement.

## 2019-02-13 NOTE — Discharge Instructions (Signed)
Implanted Port Insertion, Care After °This sheet gives you information about how to care for yourself after your procedure. Your health care provider may also give you more specific instructions. If you have problems or questions, contact your health care provider. °What can I expect after the procedure? °After the procedure, it is common to have: °· Discomfort at the port insertion site. °· Bruising on the skin over the port. This should improve over 3-4 days. °Follow these instructions at home: °Port care °· After your port is placed, you will get a manufacturer's information card. The card has information about your port. Keep this card with you at all times. °· Take care of the port as told by your health care provider. Ask your health care provider if you or a family member can get training for taking care of the port at home. A home health care nurse may also take care of the port. °· Make sure to remember what type of port you have. °Incision care ° °  ° °· Follow instructions from your health care provider about how to take care of your port insertion site. Make sure you: °? Wash your hands with soap and water before and after you change your bandage (dressing). If soap and water are not available, use hand sanitizer. °? Change your dressing as told by your health care provider. °? Leave stitches (sutures), skin glue, or adhesive strips in place. These skin closures may need to stay in place for 2 weeks or longer. If adhesive strip edges start to loosen and curl up, you may trim the loose edges. Do not remove adhesive strips completely unless your health care provider tells you to do that. °· Check your port insertion site every day for signs of infection. Check for: °? Redness, swelling, or pain. °? Fluid or blood. °? Warmth. °? Pus or a bad smell. °Activity °· Return to your normal activities as told by your health care provider. Ask your health care provider what activities are safe for you. °· Do not  lift anything that is heavier than 10 lb (4.5 kg), or the limit that you are told, until your health care provider says that it is safe. °General instructions °· Take over-the-counter and prescription medicines only as told by your health care provider. °· Do not take baths, swim, or use a hot tub until your health care provider approves. Ask your health care provider if you may take showers. You may only be allowed to take sponge baths. °· Do not drive for 24 hours if you were given a sedative during your procedure. °· Wear a medical alert bracelet in case of an emergency. This will tell any health care providers that you have a port. °· Keep all follow-up visits as told by your health care provider. This is important. °Contact a health care provider if: °· You cannot flush your port with saline as directed, or you cannot draw blood from the port. °· You have a fever or chills. °· You have redness, swelling, or pain around your port insertion site. °· You have fluid or blood coming from your port insertion site. °· Your port insertion site feels warm to the touch. °· You have pus or a bad smell coming from the port insertion site. °Get help right away if: °· You have chest pain or shortness of breath. °· You have bleeding from your port that you cannot control. °Summary °· Take care of the port as told by your health   care provider. Keep the manufacturer's information card with you at all times. °· Change your dressing as told by your health care provider. °· Contact a health care provider if you have a fever or chills or if you have redness, swelling, or pain around your port insertion site. °· Keep all follow-up visits as told by your health care provider. °This information is not intended to replace advice given to you by your health care provider. Make sure you discuss any questions you have with your health care provider. °Document Released: 04/15/2013 Document Revised: 01/21/2018 Document Reviewed:  01/21/2018 °Elsevier Patient Education © 2020 [employer] ° °Moderate Conscious Sedation, Adult, Care After °These instructions provide you with information about caring for yourself after your procedure. Your health care provider may also give you more specific instructions. Your treatment has been planned according to current medical practices, but problems sometimes occur. Call your health care provider if you have any problems or questions after your procedure. °What can I expect after the procedure? °After your procedure, it is common: °· To feel sleepy for several hours. °· To feel clumsy and have poor balance for several hours. °· To have poor judgment for several hours. °· To vomit if you eat too soon. °Follow these instructions at home: °For at least 24 hours after the procedure: ° °· Do not: °? Participate in activities where you could fall or become injured. °? Drive. °? Use heavy machinery. °? Drink alcohol. °? Take sleeping pills or medicines that cause drowsiness. °? Make important decisions or sign legal documents. °? Take care of children on your own. °· Rest. °Eating and drinking °· Follow the diet recommended by your health care provider. °· If you vomit: °? Drink water, juice, or soup when you can drink without vomiting. °? Make sure you have little or no nausea before eating solid foods. °General instructions °· Have a responsible adult stay with you until you are awake and alert. °· Take over-the-counter and prescription medicines only as told by your health care provider. °· If you smoke, do not smoke without supervision. °· Keep all follow-up visits as told by your health care provider. This is important. °Contact a health care provider if: °· You keep feeling nauseous or you keep vomiting. °· You feel light-headed. °· You develop a rash. °· You have a fever. °Get help right away if: °· You have trouble breathing. °This information is not intended to replace advice given to you by your  health care provider. Make sure you discuss any questions you have with your health care provider. °Document Released: 04/15/2013 Document Revised: 06/07/2017 Document Reviewed: 10/15/2015 °Elsevier Patient Education © 2020 [employer] ° °

## 2019-02-14 NOTE — Progress Notes (Signed)
[name]  [phone] Fax:(336) [phone]  ID: Jose Dunlap OB: September 02, 1950  MR#: [id number]  MNO#:[id number]  Patient Care Team: Olin Hauser, DO as PCP - General (Family Medicine)  CHIEF COMPLAINT: Cholangiocarcinoma  INTERVAL HISTORY: Patient returns to clinic today for further evaluation and to initiate cycle 1, day 1 of cisplatin and gemcitabine.  He continues to have significant weakness and fatigue and a declining performance status.  He has a poor appetite. He does not complain of pain.  He has no neurologic complaints.  He denies any recent fevers or illnesses.  He has no chest pain, shortness of breath, or hemoptysis.  He continues to have chronic cough.  He denies any nausea, vomiting, constipation, or diarrhea.  He has no melena or hematochezia.  He has no urinary complaints.  Patient feels generally terrible, but offers no specific complaints today.  REVIEW OF SYSTEMS:   Review of Systems  Constitutional: Positive for malaise/fatigue and weight loss. Negative for fever.  Respiratory: Positive for cough. Negative for hemoptysis and shortness of breath.   Cardiovascular: Negative.  Negative for chest pain and leg swelling.  Gastrointestinal: Negative.  Negative for abdominal pain, blood in stool, constipation, diarrhea, melena, nausea and vomiting.  Genitourinary: Negative.  Negative for dysuria.  Musculoskeletal: Negative.  Negative for back pain.  Skin: Negative.  Negative for rash.  Neurological: Positive for weakness. Negative for dizziness, focal weakness and headaches.  Psychiatric/Behavioral: The patient is nervous/anxious and has insomnia.     As per HPI. Otherwise, a complete review of systems is negative.  PAST MEDICAL HISTORY: Past Medical History:  Diagnosis Date   Essential hypertension    Hyperlipidemia    Liver cancer ([facility])    MI (myocardial infarction) ([place])    Obesity    Pacemaker    PACER/DEFIBRILLATOR [address], [place] [employer]   Shortness of breath dyspnea     PAST SURGICAL HISTORY: Past Surgical History:  Procedure Laterality Date   ABDOMINAL HERNIA REPAIR  2001   BACK SURGERY  1986   COLONOSCOPY WITH PROPOFOL N/A 05/19/2015   Procedure: COLONOSCOPY WITH PROPOFOL;  Surgeon: Hulen Luster, MD;  Location: [facility];  Service: Gastroenterology;  Laterality: N/A;   CORONARY ANGIOPLASTY     INSERT / REPLACE / REMOVE PACEMAKER     IR IMAGING GUIDED PORT INSERTION  02/13/2019   KNEE ARTHROSCOPY     PACEMAKER PLACEMENT     PROSTATE SURGERY     TONSILLECTOMY      FAMILY HISTORY: History reviewed. No pertinent family history.  ADVANCED DIRECTIVES (Y/N):  N  HEALTH MAINTENANCE: Social History   Tobacco Use   Smoking status: Former Smoker   Smokeless tobacco: Former Network engineer Use Topics   Alcohol use: Not Currently   Drug use: No     Colonoscopy:  PAP:  Bone density:  Lipid panel:  No Known Allergies  Current Outpatient Medications  Medication Sig Dispense Refill   albuterol (VENTOLIN HFA) 108 (90 Base) MCG/ACT inhaler Inhale 2 puffs into the lungs every 4 (four) hours as needed for wheezing or shortness of breath (cough). 8 g 0   ALPRAZolam (XANAX) 0.5 MG tablet Take 1 tablet (0.5 mg total) by mouth at bedtime as needed for anxiety. 30 tablet 1   aspirin 81 MG tablet Chew 81 mg by mouth daily.     benzonatate (TESSALON) 100 MG capsule Take 1 capsule (100 mg total) by mouth 3 (three) times daily as needed  for cough. 90 capsule 0   Cholecalciferol (D 1000) 1000 units capsule Take 1 capsule by mouth daily.     clopidogrel (PLAVIX) 75 MG tablet Take 1 tablet (75 mg total) by mouth daily. 90 tablet 1   fluticasone (FLONASE) 50 MCG/ACT nasal spray Place 2 sprays into both nostrils daily. 16 g 6   Icosapent Ethyl (VASCEPA) 1 g CAPS Take 2 g by mouth 2 (two) times daily. 120 capsule 5   lidocaine-prilocaine (EMLA) cream Apply  to affected area once 30 g 3   Magnesium Cl-Calcium Carbonate (SLOW MAGNESIUM/CALCIUM) 70-117 MG TBEC Take 1 tablet by mouth daily.      Multiple Vitamin (MULTIVITAMIN) capsule Take 1 capsule by mouth daily.     nebivolol (BYSTOLIC) 5 MG tablet Take 1 tablet (5 mg total) by mouth daily. 90 tablet 1   nitroGLYCERIN (NITROSTAT) 0.4 MG SL tablet Place 1 tablet (0.4 mg total) under the tongue as needed. 25 tablet 4   Omega-3 Fatty Acids (FISH OIL PO) Take 1 capsule by mouth 2 (two) times daily.      ondansetron (ZOFRAN) 8 MG tablet Take 1 tablet (8 mg total) by mouth 2 (two) times daily as needed. 30 tablet 2   prochlorperazine (COMPAZINE) 10 MG tablet Take 1 tablet (10 mg total) by mouth every 6 (six) hours as needed (Nausea or vomiting). 60 tablet 2   vitamin B-12 (CYANOCOBALAMIN) 250 MCG tablet Take 1 tablet by mouth daily.     No current facility-administered medications for this visit.    Facility-Administered Medications Ordered in Other Visits  Medication Dose Route Frequency Provider Last Rate Last Dose   heparin lock flush 100 unit/mL  500 Units Intracatheter Once PRN Lloyd Huger, MD        OBJECTIVE: Vitals:   02/17/19 0842  BP: 111/75  Pulse: 77  Temp: (!) 96.6 F (35.9 C)  SpO2: 97%     Body mass index is 26.31 kg/m.    ECOG FS:2 - Symptomatic, <50% confined to bed  General: Ill-appearing, no acute distress. Eyes: Pink conjunctiva, anicteric sclera. HEENT: Normocephalic, moist mucous membranes. Lungs: Clear to auscultation bilaterally. Heart: Regular rate and rhythm. No rubs, murmurs, or gallops. Abdomen: Soft, nontender, nondistended. No organomegaly noted, normoactive bowel sounds. Musculoskeletal: No edema, cyanosis, or clubbing. Neuro: Alert, answering all questions appropriately. Cranial nerves grossly intact. Skin: No rashes or petechiae noted. Psych: Normal affect.  LAB RESULTS:  Lab Results  Component Value Date   NA 132 (L) 02/17/2019   K  4.5 02/17/2019   CL 97 (L) 02/17/2019   CO2 23 02/17/2019   GLUCOSE 114 (H) 02/17/2019   BUN 13 02/17/2019   CREATININE 0.78 02/17/2019   CALCIUM 8.7 (L) 02/17/2019   PROT 7.2 02/17/2019   ALBUMIN 2.8 (L) 02/17/2019   AST 72 (H) 02/17/2019   ALT 47 (H) 02/17/2019   ALKPHOS 958 (H) 02/17/2019   BILITOT 1.4 (H) 02/17/2019   GFRNONAA >60 02/17/2019   GFRAA >60 02/17/2019    Lab Results  Component Value Date   WBC 34.6 (H) 02/17/2019   NEUTROABS 30.6 (H) 02/17/2019   HGB 9.8 (L) 02/17/2019   HCT 29.8 (L) 02/17/2019   MCV 73.9 (L) 02/17/2019   PLT 376 02/17/2019     STUDIES: Nm Pet Image Initial (pi) Skull Base To Thigh  Result Date: 01/26/2019 CLINICAL DATA:  Initial treatment strategy for liver masses and retroperitoneal adenopathy. EXAM: NUCLEAR MEDICINE PET SKULL BASE TO THIGH TECHNIQUE: 10.9 mCi F-18  FDG was injected intravenously. Full-ring PET imaging was performed from the skull base to thigh after the radiotracer. CT data was obtained and used for attenuation correction and anatomic localization. Fasting blood glucose: 102 mg/dl COMPARISON:  Multiple exams, including CT from 01/14/2019 and ultrasound from 01/12/2019 FINDINGS: Mediastinal blood pool activity: SUV max 2.5 Liver activity: SUV max NA NECK: 0.8 cm left level IV lymph node on image 69/3 with maximum SUV 3.7. Incidental CT findings: Bilateral common carotid atherosclerotic calcification. CHEST: No significant abnormal hypermetabolic activity in this region. Incidental CT findings: Mild cardiomegaly. Pacer/AICD. No hypermetabolic or pathologically enlarged adenopathy in the chest. ABDOMEN/PELVIS: Hypermetabolic scattered hepatic activity corresponding to the innumerable hepatic nodule shown on recent PET-CT. The confluent mass involving the caudate lobe of the liver has a maximum SUV of 11.1. Multifocal hypermetabolic activity in segment 4b of the liver has maximum SUV of 8.4. Other areas of confluence of hypermetabolic  activity include the dome of segment 2 laterally in segment 4a of the liver, but there scattered hypermetabolic nodules throughout the liver. Just below the caudate lobe a hypermetabolic lymph node measuring approximately 2.6 cm in short axis on image 173/3 has a maximum SUV of 10.1. Additional hypermetabolic porta hepatis and retroperitoneal lymph nodes are present. An index left periaortic lymph node measuring 1.2 cm in short axis on image 176/3 has maximum SUV of 6.6. Small hypermetabolic peripancreatic lymph nodes are present. I do not observe a definite hypermetabolic pancreatic mass. There is some accentuated metabolic activity throughout much of the ascending colon, without CT correlate. No hypermetabolic small bowel mass is identified. There is some accentuated activity in the vicinity of the anus, maximum SUV 7.7. Incidental CT findings: Scalloped contour of the left mid to lower kidney posteriorly with underlying mild precontrast accentuated density, as on image 193/3. This may be from localized scarring although the hyperdense appearance is slightly unusual. I do not observe abnormal accentuated metabolic activity in this vicinity compared to the rest of kidney. Aortoiliac atherosclerotic vascular disease. SKELETON: Diffuse mildly accentuated activity throughout the skeleton with some mild heterogeneity but without CT correlate. Representative activity in the left upper sacral ala maximum SUV 6.3. I do not see obvious asymmetry. Incidental CT findings: none IMPRESSION: 1. Hypermetabolic masses are scattered in the liver, with the largest/most confluent in the caudate lobe with maximum SUV of 11.1. 2. There is adjacent hypermetabolic porta hepatis and retroperitoneal adenopathy. 3. I do not identify a definite separate primary. There is some accentuated activity throughout the ascending colon but without CT correlate, this probably physiologic. There is also some localized activity at the anus, probably  physiologic, although correlation with digital rectal exam is suggested. The lack of a definite separate primary raises the possibility of a primary liver lesion such as cholangiocarcinoma or hepatocellular carcinoma. 4. Outside of the abdomen, note is made of a 0.8 cm left neck level IV lymph node with maximum SUV of 3.7 which is mildly above the blood pool. This lesion is nonspecific but a metastatic lesion is not totally excluded. 5. There is diffuse accentuated activity throughout the skeleton with some mild heterogeneity but without a CT correlate or significant asymmetry. Electronically Signed   By: Van Clines M.D.   On: 01/26/2019 13:11   US Biopsy (liver)  Result Date: 01/27/2019 INDICATION: Multiple liver lesions throughout the liver with no current known source for metastatic disease. The patient presents for biopsy of the liver. EXAM: ULTRASOUND GUIDED CORE BIOPSY OF LIVER MEDICATIONS: None. ANESTHESIA/SEDATION: Fentanyl  75 mcg IV; Versed 2.0 mg IV Moderate Sedation Time:  13 minutes. The patient was continuously monitored during the procedure by the interventional radiology nurse under my direct supervision. PROCEDURE: The procedure, risks, benefits, and alternatives were explained to the patient. Questions regarding the procedure were encouraged and answered. The patient understands and consents to the procedure. A time-out was performed prior to initiating the procedure. Ultrasound was initially performed of the liver to localize lesions. The abdominal wall was prepped with chlorhexidine in a sterile fashion, and a sterile drape was applied covering the operative field. A sterile gown and sterile gloves were used for the procedure. Local anesthesia was provided with 1% Lidocaine. Under ultrasound guidance, a 17 gauge trocar needle was advanced into the left lobe of the liver. Three separate 18 gauge core biopsy samples were then obtained under ultrasound guidance with an automated device.  Core biopsy samples were submitted in formalin. Cut Gel-Foam pledgets were advanced through the outer needle as the needle was retracted. Additional ultrasound was performed. COMPLICATIONS: None immediate. FINDINGS: Innumerable hypoechoic lesions are seen throughout the liver parenchyma. Adjacent lesions were chosen in the medial segment of the left lobe of the liver with the largest measuring approximately 1.6 cm. Solid tissue was obtained. IMPRESSION: Ultrasound-guided core biopsy performed of lesions in the left lobe of the liver. Electronically Signed   By: Aletta Edouard M.D.   On: 01/27/2019 10:44   Ir Imaging Guided Port Insertion  Result Date: 02/13/2019 INDICATION: Poor venous access. In need of durable intravenous access for chemotherapy administration EXAM: IMPLANTED PORT A CATH PLACEMENT WITH ULTRASOUND AND FLUOROSCOPIC GUIDANCE COMPARISON:  None. MEDICATIONS: Ancef 2 gm IV; The antibiotic was administered within an appropriate time interval prior to skin puncture. ANESTHESIA/SEDATION: Moderate (conscious) sedation was employed during this procedure. A total of Versed 3 mg and Fentanyl 100 mcg was administered intravenously. Moderate Sedation Time: 27 minutes. The patient's level of consciousness and vital signs were monitored continuously by radiology nursing throughout the procedure under my direct supervision. CONTRAST:  None FLUOROSCOPY TIME:  24 seconds (6.1 mGy) COMPLICATIONS: None immediate. PROCEDURE: The procedure, risks, benefits, and alternatives were explained to the patient. Questions regarding the procedure were encouraged and answered. The patient understands and consents to the procedure. The right neck and chest were prepped with chlorhexidine in a sterile fashion, and a sterile drape was applied covering the operative field. Maximum barrier sterile technique with sterile gowns and gloves were used for the procedure. A timeout was performed prior to the initiation of the procedure.  Local anesthesia was provided with 1% lidocaine with epinephrine. After creating a small venotomy incision, a micropuncture kit was utilized to access the internal jugular vein. Real-time ultrasound guidance was utilized for vascular access including the acquisition of a permanent ultrasound image documenting patency of the accessed vessel. The microwire was utilized to measure appropriate catheter length. A subcutaneous port pocket was then created along the upper chest wall utilizing a combination of sharp and blunt dissection. The pocket was irrigated with sterile saline. A single lumen power injectable port was chosen for placement. The 8 Fr catheter was tunneled from the port pocket site to the venotomy incision. The port was placed in the pocket. The external catheter was trimmed to appropriate length. At the venotomy, an 8 Fr peel-away sheath was placed over a guidewire under fluoroscopic guidance. The catheter was then placed through the sheath and the sheath was removed. Final catheter positioning was confirmed and documented with a fluoroscopic  spot radiograph. The port was accessed with a Huber needle, aspirated and flushed with heparinized saline. The venotomy site was closed with an interrupted 4-0 Vicryl suture. The port pocket incision was closed with interrupted 3-0 Vicryl suture and the skin was opposed with a running subcuticular 4-0 Vicryl suture. Dermabond and Steri-strips were applied to both incisions. Dressings were placed. The patient tolerated the procedure well without immediate post procedural complication. FINDINGS: After catheter placement, the tip lies within the superior cavoatrial junction. The catheter aspirates and flushes normally and is ready for immediate use. IMPRESSION: Successful placement of a right internal jugular approach power injectable Port-A-Cath. The catheter is ready for immediate use. Electronically Signed   By: Sandi Mariscal M.D.   On: 02/13/2019 09:55     ASSESSMENT: Cholangiocarcinoma.  PLAN:    1.Cholangiocarcinoma: PET scan results from January 26, 2019 reviewed independently and reported as above with confluence hypermetabolic masses throughout the liver as well as porta hepatis and retroperitoneal lymphadenopathy.  Patient has a mildly's elevated CEA at 7.1, but the remainder of his tumor markers are either negative or within normal limits.  Case was discussed with pathology most likely diagnosis is cholangiocarcinoma.  Patient wishes to pursue palliative chemotherapy.  Patient has now had port placed.  Proceed with cycle 1, day 1 of cisplatin and gemcitabine.  Return to clinic in 1 week for further evaluation and consideration of cycle 1, day 8 which is gemcitabine only.  2.  Leukocytosis: Possibly reactive, monitor. 3.  Anemia: Mild.  Patient has a mild iron deficiency and may benefit from IV Feraheme in the future.  4.  Insomnia/anxiety: Continue Xanax as needed. 5.  Cough: Continue Tessalon Perles as needed.   Patient expressed understanding and was in agreement with this plan. He also understands that He can call clinic at any time with any questions, concerns, or complaints.   Cancer Staging No matching staging information was found for the patient.  Lloyd Huger, MD   02/17/2019 2:30 PM

## 2019-02-17 ENCOUNTER — Other Ambulatory Visit: Payer: Self-pay

## 2019-02-17 ENCOUNTER — Encounter: Payer: Self-pay | Admitting: Oncology

## 2019-02-17 ENCOUNTER — Inpatient Hospital Stay: Payer: PPO

## 2019-02-17 ENCOUNTER — Telehealth: Payer: PPO

## 2019-02-17 ENCOUNTER — Inpatient Hospital Stay (HOSPITAL_BASED_OUTPATIENT_CLINIC_OR_DEPARTMENT_OTHER): Payer: PPO | Admitting: Oncology

## 2019-02-17 VITALS — BP 129/79 | HR 80 | Resp 19

## 2019-02-17 VITALS — BP 111/75 | HR 77 | Temp 96.6°F | Ht 72.0 in | Wt 194.0 lb

## 2019-02-17 DIAGNOSIS — C221 Intrahepatic bile duct carcinoma: Secondary | ICD-10-CM

## 2019-02-17 DIAGNOSIS — Z95828 Presence of other vascular implants and grafts: Secondary | ICD-10-CM

## 2019-02-17 DIAGNOSIS — Z5111 Encounter for antineoplastic chemotherapy: Secondary | ICD-10-CM | POA: Diagnosis not present

## 2019-02-17 DIAGNOSIS — R160 Hepatomegaly, not elsewhere classified: Secondary | ICD-10-CM

## 2019-02-17 LAB — COMPREHENSIVE METABOLIC PANEL
ALT: 47 U/L — ABNORMAL HIGH (ref 0–44)
AST: 72 U/L — ABNORMAL HIGH (ref 15–41)
Albumin: 2.8 g/dL — ABNORMAL LOW (ref 3.5–5.0)
Alkaline Phosphatase: 958 U/L — ABNORMAL HIGH (ref 38–126)
Anion gap: 12 (ref 5–15)
BUN: 13 mg/dL (ref 8–23)
CO2: 23 mmol/L (ref 22–32)
Calcium: 8.7 mg/dL — ABNORMAL LOW (ref 8.9–10.3)
Chloride: 97 mmol/L — ABNORMAL LOW (ref 98–111)
Creatinine, Ser: 0.78 mg/dL (ref 0.61–1.24)
GFR calc Af Amer: >60 mL/min (ref >60)
GFR calc non Af Amer: >60 mL/min (ref >60)
Glucose, Bld: 114 mg/dL — ABNORMAL HIGH (ref 70–99)
Potassium: 4.5 mmol/L (ref 3.5–5.1)
Sodium: 132 mmol/L — ABNORMAL LOW (ref 135–145)
Total Bilirubin: 1.4 mg/dL — ABNORMAL HIGH (ref 0.3–1.2)
Total Protein: 7.2 g/dL (ref 6.5–8.1)

## 2019-02-17 LAB — CBC WITH DIFFERENTIAL/PLATELET
Abs Immature Granulocytes: 0.66 10*3/uL — ABNORMAL HIGH (ref 0.00–0.07)
Basophils Absolute: 0.2 10*3/uL — ABNORMAL HIGH (ref 0.0–0.1)
Basophils Relative: 1 %
Eosinophils Absolute: 0.8 10*3/uL — ABNORMAL HIGH (ref 0.0–0.5)
Eosinophils Relative: 2 %
HCT: 29.8 % — ABNORMAL LOW (ref 39.0–52.0)
Hemoglobin: 9.8 g/dL — ABNORMAL LOW (ref 13.0–17.0)
Immature Granulocytes: 2 %
Lymphocytes Relative: 3 %
Lymphs Abs: 1.1 10*3/uL (ref 0.7–4.0)
MCH: 24.3 pg — ABNORMAL LOW (ref 26.0–34.0)
MCHC: 32.9 g/dL (ref 30.0–36.0)
MCV: 73.9 fL — ABNORMAL LOW (ref 80.0–100.0)
Monocytes Absolute: 1.2 10*3/uL — ABNORMAL HIGH (ref 0.1–1.0)
Monocytes Relative: 4 %
Neutro Abs: 30.6 10*3/uL — ABNORMAL HIGH (ref 1.7–7.7)
Neutrophils Relative %: 88 %
Platelets: 376 10*3/uL (ref 150–400)
RBC: 4.03 MIL/uL — ABNORMAL LOW (ref 4.22–5.81)
RDW: 17.1 % — ABNORMAL HIGH (ref 11.5–15.5)
WBC: 34.6 10*3/uL — ABNORMAL HIGH (ref 4.0–10.5)
nRBC: 0 % (ref 0.0–0.2)

## 2019-02-17 LAB — FOLATE: Folate: 11.5 ng/mL (ref >5.9)

## 2019-02-17 LAB — IRON AND TIBC
Iron: 29 ug/dL — ABNORMAL LOW (ref 45–182)
Saturation Ratios: 16 % — ABNORMAL LOW (ref 17.9–39.5)
TIBC: 187 ug/dL — ABNORMAL LOW (ref 250–450)
UIBC: 158 ug/dL

## 2019-02-17 LAB — FERRITIN: Ferritin: 906 ng/mL — ABNORMAL HIGH (ref 24–336)

## 2019-02-17 LAB — VITAMIN B12: Vitamin B-12: 2362 pg/mL — ABNORMAL HIGH (ref 180–914)

## 2019-02-17 MED ORDER — SODIUM CHLORIDE 0.9 % IV SOLN
70.0000 mg/m2 | Freq: Once | INTRAVENOUS | Status: AC
  Administered 2019-02-17: 153 mg via INTRAVENOUS
  Filled 2019-02-17: qty 153

## 2019-02-17 MED ORDER — POTASSIUM CHLORIDE 2 MEQ/ML IV SOLN
Freq: Once | INTRAVENOUS | Status: AC
  Administered 2019-02-17: 10:00:00 via INTRAVENOUS
  Filled 2019-02-17: qty 1000

## 2019-02-17 MED ORDER — SODIUM CHLORIDE 0.9 % IV SOLN
Freq: Once | INTRAVENOUS | Status: AC
  Administered 2019-02-17: 09:00:00 via INTRAVENOUS
  Filled 2019-02-17: qty 250

## 2019-02-17 MED ORDER — SODIUM CHLORIDE 0.9% FLUSH
10.0000 mL | Freq: Once | INTRAVENOUS | Status: AC
  Administered 2019-02-17: 10 mL via INTRAVENOUS
  Filled 2019-02-17: qty 10

## 2019-02-17 MED ORDER — SODIUM CHLORIDE 0.9 % IV SOLN
Freq: Once | INTRAVENOUS | Status: AC
  Administered 2019-02-17: 12:00:00 via INTRAVENOUS
  Filled 2019-02-17: qty 5

## 2019-02-17 MED ORDER — SODIUM CHLORIDE 0.9 % IV SOLN
2200.0000 mg | Freq: Once | INTRAVENOUS | Status: AC
  Administered 2019-02-17: 2200 mg via INTRAVENOUS
  Filled 2019-02-17: qty 52.6

## 2019-02-17 MED ORDER — PALONOSETRON HCL INJECTION 0.25 MG/5ML
0.2500 mg | Freq: Once | INTRAVENOUS | Status: AC
  Administered 2019-02-17: 0.25 mg via INTRAVENOUS
  Filled 2019-02-17: qty 5

## 2019-02-17 MED ORDER — HEPARIN SOD (PORK) LOCK FLUSH 100 UNIT/ML IV SOLN
500.0000 [IU] | Freq: Once | INTRAVENOUS | Status: AC | PRN
  Administered 2019-02-17: 500 [IU]
  Filled 2019-02-17: qty 5

## 2019-02-17 NOTE — Progress Notes (Signed)
Pt tolerated first time treatment of Gemcitabine and Cisplatin well with no signs of complications or reactions, all premedication was given prior to infusion. RN educated pt if he has any concerns or questions at home to call clinic and if complications or signs of reaction occur at home to call 911 in case of an emergency. Pt verbalized understanding, all questions answered at this time. VSS upon discharge.  Mykal Kirchman [employer]

## 2019-02-17 NOTE — Progress Notes (Signed)
Patient stated that he continues to be weaker, tired and fatigued. Patient also stated that he had not been able to sleep or have an appetite.

## 2019-02-18 ENCOUNTER — Telehealth: Payer: Self-pay

## 2019-02-18 NOTE — Telephone Encounter (Signed)
Patient returned call and states he cannot believe how much better he feels today.  States slept all night last night which is the first time in over a month.  He states he is looking forward to returning next Tuesday to receive another treatment.  Encouraged patient to call for any questions or concerns.

## 2019-02-18 NOTE — Telephone Encounter (Signed)
Telephone call to patient and went straight to voice mail.  Left message I was calling him for a follow up after receiving first chemo infusion yesterday and wanted to see how he was doing.  Instructions given to call for any questions or concerns.

## 2019-02-19 NOTE — Progress Notes (Signed)
[name]  [phone] [phone]) [phone]  ID: Jose Dunlap OB: [birth year]  MR#: [id number]  AOZ#:[id number]  Patient Care Team: Olin Hauser, DO as PCP - General (Family Medicine)  CHIEF COMPLAINT: Cholangiocarcinoma  INTERVAL HISTORY: Patient returns to clinic today for further evaluation and consideration of cycle 1, day 8 of cisplatin and gemcitabine gemcitabine only.  Patient states he felt well for 2 to 3 days after his treatment, but then his performance status declined and he had significant weakness and fatigue.  He only had some mild nausea.  He has difficulty sleeping and a poor appetite.  He does not complain of pain.  He has no neurologic complaints.  He denies any recent fevers or illnesses.  He has no chest pain, shortness of breath, or hemoptysis.  He continues to have chronic cough.  He denies any nausea, vomiting, constipation, or diarrhea.  He has no melena or hematochezia.  He has no urinary complaints.  Patient continues to feel generally terrible, but offers no further specific complaints today.  REVIEW OF SYSTEMS:   Review of Systems  Constitutional: Positive for malaise/fatigue and weight loss. Negative for fever.  Respiratory: Positive for cough. Negative for hemoptysis and shortness of breath.   Cardiovascular: Negative.  Negative for chest pain and leg swelling.  Gastrointestinal: Negative.  Negative for abdominal pain, blood in stool, constipation, diarrhea, melena, nausea and vomiting.  Genitourinary: Negative.  Negative for dysuria.  Musculoskeletal: Negative.  Negative for back pain.  Skin: Negative.  Negative for rash.  Neurological: Positive for weakness. Negative for dizziness, focal weakness and headaches.  Psychiatric/Behavioral: The patient is nervous/anxious and has insomnia.     As per HPI. Otherwise, a complete review of systems is negative.  PAST MEDICAL HISTORY: Past Medical History:  Diagnosis  Date  . Essential hypertension   . Hyperlipidemia   . Liver cancer ([place])   . MI (myocardial infarction) ([place])   . Obesity   . Pacemaker    PACER/DEFIBRILLATOR [address] [place]  . Shortness of breath dyspnea     PAST SURGICAL HISTORY: Past Surgical History:  Procedure Laterality Date  . ABDOMINAL HERNIA REPAIR  2001  . BACK SURGERY  1986  . COLONOSCOPY WITH PROPOFOL N/A 05/19/2015   Procedure: COLONOSCOPY WITH PROPOFOL;  Surgeon: Hulen Luster, MD;  Location: [facility] ENDOSCOPY;  Service: Gastroenterology;  Laterality: N/A;  . CORONARY ANGIOPLASTY    . INSERT / REPLACE / REMOVE PACEMAKER    . IR IMAGING GUIDED PORT INSERTION  02/13/2019  . KNEE ARTHROSCOPY    . PACEMAKER PLACEMENT    . PROSTATE SURGERY    . TONSILLECTOMY      FAMILY HISTORY: History reviewed. No pertinent family history.  ADVANCED DIRECTIVES (Y/N):  N  HEALTH MAINTENANCE: Social History   Tobacco Use  . Smoking status: Former Research scientist (life sciences)  . Smokeless tobacco: Former Network engineer Use Topics  . Alcohol use: Not Currently  . Drug use: No     Colonoscopy:  PAP:  Bone density:  Lipid panel:  No Known Allergies  Current Outpatient Medications  Medication Sig Dispense Refill  . albuterol (VENTOLIN HFA) 108 (90 Base) MCG/ACT inhaler Inhale 2 puffs into the lungs every 4 (four) hours as needed for wheezing or shortness of breath (cough). 8 g 0  . ALPRAZolam (XANAX) 0.5 MG tablet Take 1 tablet (0.5 mg total) by mouth at bedtime as needed for anxiety. 30 tablet 1  . aspirin 81 MG  tablet Chew 81 mg by mouth daily.    . benzonatate (TESSALON) 100 MG capsule Take 1 capsule (100 mg total) by mouth 3 (three) times daily as needed for cough. 90 capsule 0  . Cholecalciferol (D 1000) 1000 units capsule Take 1 capsule by mouth daily.    . clopidogrel (PLAVIX) 75 MG tablet Take 1 tablet (75 mg total) by mouth daily. 90 tablet 1  . fluticasone (FLONASE) 50 MCG/ACT nasal spray Place 2 sprays into both nostrils  daily. 16 g 6  . Icosapent Ethyl (VASCEPA) 1 g CAPS Take 2 g by mouth 2 (two) times daily. 120 capsule 5  . lidocaine-prilocaine (EMLA) cream Apply to affected area once 30 g 3  . Multiple Vitamin (MULTIVITAMIN) capsule Take 1 capsule by mouth daily.    . nebivolol (BYSTOLIC) 5 MG tablet Take 1 tablet (5 mg total) by mouth daily. 90 tablet 1  . nitroGLYCERIN (NITROSTAT) 0.4 MG SL tablet Place 1 tablet (0.4 mg total) under the tongue as needed. 25 tablet 4  . Omega-3 Fatty Acids (FISH OIL PO) Take 1 capsule by mouth 2 (two) times daily.     . ondansetron (ZOFRAN) 8 MG tablet Take 1 tablet (8 mg total) by mouth 2 (two) times daily as needed. 30 tablet 2  . prochlorperazine (COMPAZINE) 10 MG tablet Take 1 tablet (10 mg total) by mouth every 6 (six) hours as needed (Nausea or vomiting). 60 tablet 2  . vitamin B-12 (CYANOCOBALAMIN) 250 MCG tablet Take 1 tablet by mouth daily.    . Magnesium Cl-Calcium Carbonate (SLOW MAGNESIUM/CALCIUM) 70-117 MG TBEC Take 1 tablet by mouth daily.      No current facility-administered medications for this visit.    Facility-Administered Medications Ordered in Other Visits  Medication Dose Route Frequency Provider Last Rate Last Dose  . dexamethasone (DECADRON) injection 10 mg  10 mg Intravenous Once Lloyd Huger, MD      . gemcitabine (GEMZAR) 2,200 mg in sodium chloride 0.9 % 250 mL chemo infusion  2,200 mg Intravenous Once Lloyd Huger, MD      . heparin lock flush 100 unit/mL  500 Units Intravenous Once Lloyd Huger, MD      . heparin lock flush 100 unit/mL  500 Units Intracatheter Once PRN Lloyd Huger, MD      . ondansetron [facility]) injection 8 mg  8 mg Intravenous Once Lloyd Huger, MD      . prochlorperazine (COMPAZINE) tablet 10 mg  10 mg Oral Once Lloyd Huger, MD        OBJECTIVE: Vitals:   02/24/19 1045  BP: 104/72  Pulse: 76  Resp: 18  Temp: 97.6 F (36.4 C)     There is no height or weight on file to  calculate BMI.    ECOG FS:2 - Symptomatic, <50% confined to bed  General: Ill-appearing, no acute distress. Eyes: Pink conjunctiva, anicteric sclera. HEENT: Normocephalic, moist mucous membranes, clear oropharnyx. Lungs: Clear to auscultation bilaterally. Heart: Regular rate and rhythm. No rubs, murmurs, or gallops. Abdomen: Soft, nontender, nondistended. No organomegaly noted, normoactive bowel sounds. Musculoskeletal: No edema, cyanosis, or clubbing. Neuro: Alert, answering all questions appropriately. Cranial nerves grossly intact. Skin: No rashes or petechiae noted. Psych: Normal affect.  LAB RESULTS:  Lab Results  Component Value Date   NA 129 (L) 02/24/2019   K 4.3 02/24/2019   CL 95 (L) 02/24/2019   CO2 22 02/24/2019   GLUCOSE 93 02/24/2019   BUN 13  02/24/2019   CREATININE 0.73 02/24/2019   CALCIUM 8.4 (L) 02/24/2019   PROT 7.0 02/24/2019   ALBUMIN 2.7 (L) 02/24/2019   AST 61 (H) 02/24/2019   ALT 59 (H) 02/24/2019   ALKPHOS 842 (H) 02/24/2019   BILITOT 1.3 (H) 02/24/2019   GFRNONAA >60 02/24/2019   GFRAA >60 02/24/2019    Lab Results  Component Value Date   WBC 19.5 (H) 02/24/2019   NEUTROABS 17.1 (H) 02/24/2019   HGB 9.4 (L) 02/24/2019   HCT 28.6 (L) 02/24/2019   MCV 73.3 (L) 02/24/2019   PLT 204 02/24/2019     STUDIES: Nm Pet Image Initial (pi) Skull Base To Thigh  Result Date: 01/26/2019 CLINICAL DATA:  Initial treatment strategy for liver masses and retroperitoneal adenopathy. EXAM: NUCLEAR MEDICINE PET SKULL BASE TO THIGH TECHNIQUE: 10.9 mCi F-18 FDG was injected intravenously. Full-ring PET imaging was performed from the skull base to thigh after the radiotracer. CT data was obtained and used for attenuation correction and anatomic localization. Fasting blood glucose: 102 mg/dl COMPARISON:  Multiple exams, including CT from 01/14/2019 and ultrasound from 01/12/2019 FINDINGS: Mediastinal blood pool activity: SUV max 2.5 Liver activity: SUV max NA NECK: 0.8  cm left level IV lymph node on image 69/3 with maximum SUV 3.7. Incidental CT findings: Bilateral common carotid atherosclerotic calcification. CHEST: No significant abnormal hypermetabolic activity in this region. Incidental CT findings: Mild cardiomegaly. Pacer/AICD. No hypermetabolic or pathologically enlarged adenopathy in the chest. ABDOMEN/PELVIS: Hypermetabolic scattered hepatic activity corresponding to the innumerable hepatic nodule shown on recent PET-CT. The confluent mass involving the caudate lobe of the liver has a maximum SUV of 11.1. Multifocal hypermetabolic activity in segment 4b of the liver has maximum SUV of 8.4. Other areas of confluence of hypermetabolic activity include the dome of segment 2 laterally in segment 4a of the liver, but there scattered hypermetabolic nodules throughout the liver. Just below the caudate lobe a hypermetabolic lymph node measuring approximately 2.6 cm in short axis on image 173/3 has a maximum SUV of 10.1. Additional hypermetabolic porta hepatis and retroperitoneal lymph nodes are present. An index left periaortic lymph node measuring 1.2 cm in short axis on image 176/3 has maximum SUV of 6.6. Small hypermetabolic peripancreatic lymph nodes are present. I do not observe a definite hypermetabolic pancreatic mass. There is some accentuated metabolic activity throughout much of the ascending colon, without CT correlate. No hypermetabolic small bowel mass is identified. There is some accentuated activity in the vicinity of the anus, maximum SUV 7.7. Incidental CT findings: Scalloped contour of the left mid to lower kidney posteriorly with underlying mild precontrast accentuated density, as on image 193/3. This may be from localized scarring although the hyperdense appearance is slightly unusual. I do not observe abnormal accentuated metabolic activity in this vicinity compared to the rest of kidney. Aortoiliac atherosclerotic vascular disease. SKELETON: Diffuse mildly  accentuated activity throughout the skeleton with some mild heterogeneity but without CT correlate. Representative activity in the left upper sacral ala maximum SUV 6.3. I do not see obvious asymmetry. Incidental CT findings: none IMPRESSION: 1. Hypermetabolic masses are scattered in the liver, with the largest/most confluent in the caudate lobe with maximum SUV of 11.1. 2. There is adjacent hypermetabolic porta hepatis and retroperitoneal adenopathy. 3. I do not identify a definite separate primary. There is some accentuated activity throughout the ascending colon but without CT correlate, this probably physiologic. There is also some localized activity at the anus, probably physiologic, although correlation with digital rectal exam  is suggested. The lack of a definite separate primary raises the possibility of a primary liver lesion such as cholangiocarcinoma or hepatocellular carcinoma. 4. Outside of the abdomen, note is made of a 0.8 cm left neck level IV lymph node with maximum SUV of 3.7 which is mildly above the blood pool. This lesion is nonspecific but a metastatic lesion is not totally excluded. 5. There is diffuse accentuated activity throughout the skeleton with some mild heterogeneity but without a CT correlate or significant asymmetry. Electronically Signed   By: Van Clines M.D.   On: 01/26/2019 13:11   US Biopsy (liver)  Result Date: 01/27/2019 INDICATION: Multiple liver lesions throughout the liver with no current known source for metastatic disease. The patient presents for biopsy of the liver. EXAM: ULTRASOUND GUIDED CORE BIOPSY OF LIVER MEDICATIONS: None. ANESTHESIA/SEDATION: Fentanyl 75 mcg IV; Versed 2.0 mg IV Moderate Sedation Time:  13 minutes. The patient was continuously monitored during the procedure by the interventional radiology nurse under my direct supervision. PROCEDURE: The procedure, risks, benefits, and alternatives were explained to the patient. Questions regarding the  procedure were encouraged and answered. The patient understands and consents to the procedure. A time-out was performed prior to initiating the procedure. Ultrasound was initially performed of the liver to localize lesions. The abdominal wall was prepped with chlorhexidine in a sterile fashion, and a sterile drape was applied covering the operative field. A sterile gown and sterile gloves were used for the procedure. Local anesthesia was provided with 1% Lidocaine. Under ultrasound guidance, a 17 gauge trocar needle was advanced into the left lobe of the liver. Three separate 18 gauge core biopsy samples were then obtained under ultrasound guidance with an automated device. Core biopsy samples were submitted in formalin. Cut Gel-Foam pledgets were advanced through the outer needle as the needle was retracted. Additional ultrasound was performed. COMPLICATIONS: None immediate. FINDINGS: Innumerable hypoechoic lesions are seen throughout the liver parenchyma. Adjacent lesions were chosen in the medial segment of the left lobe of the liver with the largest measuring approximately 1.6 cm. Solid tissue was obtained. IMPRESSION: Ultrasound-guided core biopsy performed of lesions in the left lobe of the liver. Electronically Signed   By: Aletta Edouard M.D.   On: 01/27/2019 10:44   Ir Imaging Guided Port Insertion  Result Date: 02/13/2019 INDICATION: Poor venous access. In need of durable intravenous access for chemotherapy administration EXAM: IMPLANTED PORT A CATH PLACEMENT WITH ULTRASOUND AND FLUOROSCOPIC GUIDANCE COMPARISON:  None. MEDICATIONS: Ancef 2 gm IV; The antibiotic was administered within an appropriate time interval prior to skin puncture. ANESTHESIA/SEDATION: Moderate (conscious) sedation was employed during this procedure. A total of Versed 3 mg and Fentanyl 100 mcg was administered intravenously. Moderate Sedation Time: 27 minutes. The patient's level of consciousness and vital signs were monitored  continuously by radiology nursing throughout the procedure under my direct supervision. CONTRAST:  None FLUOROSCOPY TIME:  24 seconds (6.1 mGy) COMPLICATIONS: None immediate. PROCEDURE: The procedure, risks, benefits, and alternatives were explained to the patient. Questions regarding the procedure were encouraged and answered. The patient understands and consents to the procedure. The right neck and chest were prepped with chlorhexidine in a sterile fashion, and a sterile drape was applied covering the operative field. Maximum barrier sterile technique with sterile gowns and gloves were used for the procedure. A timeout was performed prior to the initiation of the procedure. Local anesthesia was provided with 1% lidocaine with epinephrine. After creating a small venotomy incision, a micropuncture kit was utilized  to access the internal jugular vein. Real-time ultrasound guidance was utilized for vascular access including the acquisition of a permanent ultrasound image documenting patency of the accessed vessel. The microwire was utilized to measure appropriate catheter length. A subcutaneous port pocket was then created along the upper chest wall utilizing a combination of sharp and blunt dissection. The pocket was irrigated with sterile saline. A single lumen power injectable port was chosen for placement. The 8 Fr catheter was tunneled from the port pocket site to the venotomy incision. The port was placed in the pocket. The external catheter was trimmed to appropriate length. At the venotomy, an 8 Fr peel-away sheath was placed over a guidewire under fluoroscopic guidance. The catheter was then placed through the sheath and the sheath was removed. Final catheter positioning was confirmed and documented with a fluoroscopic spot radiograph. The port was accessed with a Huber needle, aspirated and flushed with heparinized saline. The venotomy site was closed with an interrupted 4-0 Vicryl suture. The port pocket  incision was closed with interrupted 3-0 Vicryl suture and the skin was opposed with a running subcuticular 4-0 Vicryl suture. Dermabond and Steri-strips were applied to both incisions. Dressings were placed. The patient tolerated the procedure well without immediate post procedural complication. FINDINGS: After catheter placement, the tip lies within the superior cavoatrial junction. The catheter aspirates and flushes normally and is ready for immediate use. IMPRESSION: Successful placement of a right internal jugular approach power injectable Port-A-Cath. The catheter is ready for immediate use. Electronically Signed   By: Sandi Mariscal M.D.   On: 02/13/2019 09:55    ASSESSMENT: Cholangiocarcinoma.  PLAN:    1.Cholangiocarcinoma: PET scan results from January 26, 2019 reviewed independently with confluence hypermetabolic masses throughout the liver as well as porta hepatis and retroperitoneal lymphadenopathy.  Patient has a mildly's elevated CEA at 7.1, but the remainder of his tumor markers are either negative or within normal limits.  Case was discussed with pathology and the most likely diagnosis is cholangiocarcinoma.  Patient wishes to pursue palliative chemotherapy.  Patient has now had port placed.  Patient tolerated cycle 1, day 1 of cisplatin and gemcitabine relatively well.  Proceed with cycle 1, day 8 which is gemcitabine only.  Patient will return to clinic in 1 week for further evaluation and consideration of cycle 1, day 15 gemcitabine only.   2.  Leukocytosis: Improving.  Possibly reactive, monitor. 3.  Anemia: Patient's hemoglobin is decreased, but stable at 9.4.  He has a mild iron deficiency and may benefit from IV Feraheme in the future.   4.  Insomnia/anxiety: Unclear if patient is taking his prescribed Xanax appropriately.  Appreciate palliative care input. 5.  Cough: Continue Tessalon Perles as needed. 6.  Poor appetite: Patient was given a prescription for Megace.   Patient  expressed understanding and was in agreement with this plan. He also understands that He can call clinic at any time with any questions, concerns, or complaints.   Cancer Staging No matching staging information was found for the patient.  Lloyd Huger, MD   02/24/2019 11:58 AM

## 2019-02-23 ENCOUNTER — Other Ambulatory Visit: Payer: Self-pay

## 2019-02-24 ENCOUNTER — Inpatient Hospital Stay (HOSPITAL_BASED_OUTPATIENT_CLINIC_OR_DEPARTMENT_OTHER): Payer: PPO | Admitting: Oncology

## 2019-02-24 ENCOUNTER — Encounter: Payer: Self-pay | Admitting: Oncology

## 2019-02-24 ENCOUNTER — Inpatient Hospital Stay (HOSPITAL_BASED_OUTPATIENT_CLINIC_OR_DEPARTMENT_OTHER): Payer: PPO | Admitting: Hospice and Palliative Medicine

## 2019-02-24 ENCOUNTER — Other Ambulatory Visit: Payer: Self-pay

## 2019-02-24 ENCOUNTER — Inpatient Hospital Stay: Payer: PPO

## 2019-02-24 VITALS — BP 118/86 | HR 74

## 2019-02-24 VITALS — BP 104/72 | HR 76 | Temp 97.6°F | Resp 18

## 2019-02-24 DIAGNOSIS — Z5111 Encounter for antineoplastic chemotherapy: Secondary | ICD-10-CM | POA: Diagnosis not present

## 2019-02-24 DIAGNOSIS — G4700 Insomnia, unspecified: Secondary | ICD-10-CM | POA: Diagnosis not present

## 2019-02-24 DIAGNOSIS — C221 Intrahepatic bile duct carcinoma: Secondary | ICD-10-CM | POA: Diagnosis not present

## 2019-02-24 DIAGNOSIS — R531 Weakness: Secondary | ICD-10-CM

## 2019-02-24 DIAGNOSIS — Z515 Encounter for palliative care: Secondary | ICD-10-CM | POA: Diagnosis not present

## 2019-02-24 DIAGNOSIS — F419 Anxiety disorder, unspecified: Secondary | ICD-10-CM

## 2019-02-24 LAB — COMPREHENSIVE METABOLIC PANEL
ALT: 59 U/L — ABNORMAL HIGH (ref 0–44)
AST: 61 U/L — ABNORMAL HIGH (ref 15–41)
Albumin: 2.7 g/dL — ABNORMAL LOW (ref 3.5–5.0)
Alkaline Phosphatase: 842 U/L — ABNORMAL HIGH (ref 38–126)
Anion gap: 12 (ref 5–15)
BUN: 13 mg/dL (ref 8–23)
CO2: 22 mmol/L (ref 22–32)
Calcium: 8.4 mg/dL — ABNORMAL LOW (ref 8.9–10.3)
Chloride: 95 mmol/L — ABNORMAL LOW (ref 98–111)
Creatinine, Ser: 0.73 mg/dL (ref 0.61–1.24)
GFR calc Af Amer: >60 mL/min (ref >60)
GFR calc non Af Amer: >60 mL/min (ref >60)
Glucose, Bld: 93 mg/dL (ref 70–99)
Potassium: 4.3 mmol/L (ref 3.5–5.1)
Sodium: 129 mmol/L — ABNORMAL LOW (ref 135–145)
Total Bilirubin: 1.3 mg/dL — ABNORMAL HIGH (ref 0.3–1.2)
Total Protein: 7 g/dL (ref 6.5–8.1)

## 2019-02-24 LAB — CBC WITH DIFFERENTIAL/PLATELET
Abs Immature Granulocytes: 0.13 10*3/uL — ABNORMAL HIGH (ref 0.00–0.07)
Basophils Absolute: 0.1 10*3/uL (ref 0.0–0.1)
Basophils Relative: 1 %
Eosinophils Absolute: 0.2 10*3/uL (ref 0.0–0.5)
Eosinophils Relative: 1 %
HCT: 28.6 % — ABNORMAL LOW (ref 39.0–52.0)
Hemoglobin: 9.4 g/dL — ABNORMAL LOW (ref 13.0–17.0)
Immature Granulocytes: 1 %
Lymphocytes Relative: 6 %
Lymphs Abs: 1.1 10*3/uL (ref 0.7–4.0)
MCH: 24.1 pg — ABNORMAL LOW (ref 26.0–34.0)
MCHC: 32.9 g/dL (ref 30.0–36.0)
MCV: 73.3 fL — ABNORMAL LOW (ref 80.0–100.0)
Monocytes Absolute: 0.8 10*3/uL (ref 0.1–1.0)
Monocytes Relative: 4 %
Neutro Abs: 17.1 10*3/uL — ABNORMAL HIGH (ref 1.7–7.7)
Neutrophils Relative %: 87 %
Platelets: 204 10*3/uL (ref 150–400)
RBC: 3.9 MIL/uL — ABNORMAL LOW (ref 4.22–5.81)
RDW: 17.8 % — ABNORMAL HIGH (ref 11.5–15.5)
WBC: 19.5 10*3/uL — ABNORMAL HIGH (ref 4.0–10.5)
nRBC: 0 % (ref 0.0–0.2)

## 2019-02-24 MED ORDER — SODIUM CHLORIDE 0.9 % IV SOLN
2200.0000 mg | Freq: Once | INTRAVENOUS | Status: AC
  Administered 2019-02-24: 2200 mg via INTRAVENOUS
  Filled 2019-02-24: qty 52.6

## 2019-02-24 MED ORDER — ONDANSETRON HCL 4 MG/2ML IJ SOLN
8.0000 mg | Freq: Once | INTRAMUSCULAR | Status: AC
  Administered 2019-02-24: 8 mg via INTRAVENOUS
  Filled 2019-02-24: qty 4

## 2019-02-24 MED ORDER — SODIUM CHLORIDE 0.9 % IV SOLN
Freq: Once | INTRAVENOUS | Status: AC
  Administered 2019-02-24: 12:00:00 via INTRAVENOUS
  Filled 2019-02-24: qty 250

## 2019-02-24 MED ORDER — PROCHLORPERAZINE MALEATE 10 MG PO TABS: 10.0000 mg | ORAL_TABLET | Freq: Once | ORAL | Status: DC

## 2019-02-24 MED ORDER — MEGESTROL ACETATE 40 MG PO TABS: 40.0000 mg | ORAL_TABLET | Freq: Every day | ORAL | 1 refills | Status: DC

## 2019-02-24 MED ORDER — DEXAMETHASONE SODIUM PHOSPHATE 10 MG/ML IJ SOLN
10.0000 mg | Freq: Once | INTRAMUSCULAR | Status: AC
  Administered 2019-02-24: 10 mg via INTRAVENOUS
  Filled 2019-02-24: qty 1

## 2019-02-24 MED ORDER — TRAZODONE HCL 50 MG PO TABS: 25.0000 mg | ORAL_TABLET | Freq: Every evening | ORAL | 1 refills | Status: DC | PRN

## 2019-02-24 MED ORDER — SODIUM CHLORIDE 0.9% FLUSH
10.0000 mL | Freq: Once | INTRAVENOUS | Status: AC
  Administered 2019-02-24: 10:00:00 10 mL via INTRAVENOUS
  Filled 2019-02-24: qty 10

## 2019-02-24 MED ORDER — HEPARIN SOD (PORK) LOCK FLUSH 100 UNIT/ML IV SOLN
500.0000 [IU] | Freq: Once | INTRAVENOUS | Status: AC | PRN
  Administered 2019-02-24: 500 [IU]

## 2019-02-24 MED ORDER — HEPARIN SOD (PORK) LOCK FLUSH 100 UNIT/ML IV SOLN
500.0000 [IU] | Freq: Once | INTRAVENOUS | Status: AC
  Administered 2019-02-24: 13:00:00 500 [IU] via INTRAVENOUS
  Filled 2019-02-24: qty 5

## 2019-02-24 NOTE — Progress Notes (Signed)
02/24/19  Received order to give additional anti-nauseants prior to gemcitabine today due to excessive nausea.  Zofran 8 mg IV x 1 Dexamethasone 10 mg IV x 1  Orders entered to reflect additional medications.  Thank you for the opportunity to participate in Jose Dunlap's care.  T.O. Dr Philmore Pali, PharmD

## 2019-02-24 NOTE — Progress Notes (Signed)
[place]  [phone] [phone]   Name: Jose Dunlap Date: 02/24/2019 MRN: [id number]  DOB: 14-Nov-1950  Patient Care Team: Olin Hauser, DO as PCP - General (Family Medicine)    REASON FOR CONSULTATION: Palliative Care consult requested for this 68 y.o. 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  Patient was hospitalized 01/12/2019-01/14/2019 with weight loss, weakness and shortness of breath and on CT imaging was found to have innumerable liver masses and retroperitoneal lymphadenopathy concerning for metastatic disease.  Pathology consistent with cholangiocarcinoma.  He was started on regimen of gemcitabine and cisplatin.  He was referred to palliative care to help address goals and manage ongoing symptoms.  SOCIAL HISTORY:     reports that he has quit smoking. He has quit using smokeless tobacco. He reports previous alcohol use. He reports that he does not use drugs.   Patient is married and lives at home with his wife.  ADVANCE DIRECTIVES:    CODE STATUS:   PAST MEDICAL HISTORY: Past Medical History:  Diagnosis Date   Essential hypertension    Hyperlipidemia    Liver cancer ([place])    MI (myocardial infarction) ([place])    Obesity    Pacemaker    PACER/DEFIBRILLATOR [address], [place] [employer]   Shortness of breath dyspnea     PAST SURGICAL HISTORY:  Past Surgical History:  Procedure Laterality Date   ABDOMINAL HERNIA REPAIR  2001   BACK SURGERY  1986   COLONOSCOPY WITH PROPOFOL N/A 05/19/2015   Procedure: COLONOSCOPY WITH PROPOFOL;  Surgeon: Hulen Luster, MD;  Location: [facility] ENDOSCOPY;  Service: Gastroenterology;  Laterality: N/A;   CORONARY ANGIOPLASTY     INSERT / REPLACE / REMOVE PACEMAKER     IR IMAGING GUIDED PORT INSERTION   02/13/2019   KNEE ARTHROSCOPY     PACEMAKER PLACEMENT     PROSTATE SURGERY     TONSILLECTOMY      HEMATOLOGY/ONCOLOGY HISTORY:  Oncology History  Cholangiocarcinoma of liver ([place])  02/08/2019 Initial Diagnosis   Cholangiocarcinoma of liver ([place])   02/17/2019 -  Chemotherapy   The patient had palonosetron (ALOXI) injection 0.25 mg, 0.25 mg, Intravenous,  Once, 1 of 4 cycles Administration: 0.25 mg (02/17/2019) CISplatin (PLATINOL) 153 mg in sodium chloride 0.9 % 500 mL chemo infusion, 70 mg/m2 = 153 mg, Intravenous,  Once, 1 of 4 cycles Administration: 153 mg (02/17/2019) gemcitabine (GEMZAR) 2,200 mg in sodium chloride 0.9 % 250 mL chemo infusion, 2,204 mg, Intravenous,  Once, 1 of 4 cycles Administration: 2,200 mg (02/17/2019) fosaprepitant (EMEND) 150 mg, dexamethasone (DECADRON) 12 mg in sodium chloride 0.9 % 145 mL IVPB, , Intravenous,  Once, 1 of 4 cycles Administration:  (02/17/2019)  for chemotherapy treatment.      ALLERGIES:  has No Known Allergies.  MEDICATIONS:  Current Outpatient Medications  Medication Sig Dispense Refill   albuterol (VENTOLIN HFA) 108 (90 Base) MCG/ACT inhaler Inhale 2 puffs into the lungs every 4 (four) hours as needed for wheezing or shortness of breath (cough). 8 g 0   ALPRAZolam (XANAX) 0.5 MG tablet Take 1 tablet (0.5 mg total) by mouth at bedtime as needed for anxiety. 30 tablet 1   aspirin 81 MG tablet Chew 81 mg by mouth daily.     benzonatate (TESSALON) 100 MG capsule Take 1 capsule (100 mg  total) by mouth 3 (three) times daily as needed for cough. 90 capsule 0   Cholecalciferol (D 1000) 1000 units capsule Take 1 capsule by mouth daily.     clopidogrel (PLAVIX) 75 MG tablet Take 1 tablet (75 mg total) by mouth daily. 90 tablet 1   fluticasone (FLONASE) 50 MCG/ACT nasal spray Place 2 sprays into both nostrils daily. 16 g 6   Icosapent Ethyl (VASCEPA) 1 g CAPS Take 2 g by mouth 2 (two) times daily. 120 capsule 5   lidocaine-prilocaine  (EMLA) cream Apply to affected area once 30 g 3   Magnesium Cl-Calcium Carbonate (SLOW MAGNESIUM/CALCIUM) 70-117 MG TBEC Take 1 tablet by mouth daily.      megestrol (MEGACE) 40 MG tablet Take 1 tablet (40 mg total) by mouth daily. 30 tablet 1   Multiple Vitamin (MULTIVITAMIN) capsule Take 1 capsule by mouth daily.     nebivolol (BYSTOLIC) 5 MG tablet Take 1 tablet (5 mg total) by mouth daily. 90 tablet 1   nitroGLYCERIN (NITROSTAT) 0.4 MG SL tablet Place 1 tablet (0.4 mg total) under the tongue as needed. 25 tablet 4   Omega-3 Fatty Acids (FISH OIL PO) Take 1 capsule by mouth 2 (two) times daily.      ondansetron (ZOFRAN) 8 MG tablet Take 1 tablet (8 mg total) by mouth 2 (two) times daily as needed. 30 tablet 2   prochlorperazine (COMPAZINE) 10 MG tablet Take 1 tablet (10 mg total) by mouth every 6 (six) hours as needed (Nausea or vomiting). 60 tablet 2   traZODone (DESYREL) 50 MG tablet Take 0.5-1 tablets (25-50 mg total) by mouth at bedtime as needed for sleep. 30 tablet 1   vitamin B-12 (CYANOCOBALAMIN) 250 MCG tablet Take 1 tablet by mouth daily.     No current facility-administered medications for this visit.    Facility-Administered Medications Ordered in Other Visits  Medication Dose Route Frequency Provider Last Rate Last Dose   gemcitabine (GEMZAR) 2,200 mg in sodium chloride 0.9 % 250 mL chemo infusion  2,200 mg Intravenous Once Lloyd Huger, MD 616 mL/hr at 02/24/19 1229 2,200 mg at 02/24/19 1229   heparin lock flush 100 unit/mL  500 Units Intravenous Once Lloyd Huger, MD       heparin lock flush 100 unit/mL  500 Units Intracatheter Once PRN Lloyd Huger, MD       prochlorperazine (COMPAZINE) tablet 10 mg  10 mg Oral Once Lloyd Huger, MD        VITAL SIGNS: There were no vitals taken for this visit. There were no vitals filed for this visit.  Estimated body mass index is 26.31 kg/m as calculated from the following:   Height as of  02/17/19: 6' (1.829 m).   Weight as of 02/17/19: 194 lb (88 kg).  LABS: CBC:    Component Value Date/Time   WBC 19.5 (H) 02/24/2019 1023   HGB 9.4 (L) 02/24/2019 1023   HGB 15.6 04/20/2015 0816   HCT 28.6 (L) 02/24/2019 1023   HCT 44.4 04/20/2015 0816   PLT 204 02/24/2019 1023   PLT 215 04/20/2015 0816   MCV 73.3 (L) 02/24/2019 1023   MCV 85 04/20/2015 0816   NEUTROABS 17.1 (H) 02/24/2019 1023   NEUTROABS 6.1 04/20/2015 0816   LYMPHSABS 1.1 02/24/2019 1023   LYMPHSABS 1.3 04/20/2015 0816   MONOABS 0.8 02/24/2019 1023   EOSABS 0.2 02/24/2019 1023   EOSABS 0.2 04/20/2015 0816   BASOSABS 0.1 02/24/2019 1023  BASOSABS 0.0 04/20/2015 0816   Comprehensive Metabolic Panel:    Component Value Date/Time   NA 129 (L) 02/24/2019 1023   NA 138 04/20/2015 0816   K 4.3 02/24/2019 1023   CL 95 (L) 02/24/2019 1023   CO2 22 02/24/2019 1023   BUN 13 02/24/2019 1023   BUN 12 04/20/2015 0816   CREATININE 0.73 02/24/2019 1023   CREATININE 0.83 08/06/2017 0811   GLUCOSE 93 02/24/2019 1023   CALCIUM 8.4 (L) 02/24/2019 1023   AST 61 (H) 02/24/2019 1023   ALT 59 (H) 02/24/2019 1023   ALKPHOS 842 (H) 02/24/2019 1023   BILITOT 1.3 (H) 02/24/2019 1023   BILITOT 0.9 04/20/2015 0816   PROT 7.0 02/24/2019 1023   PROT 6.6 04/20/2015 0816   ALBUMIN 2.7 (L) 02/24/2019 1023   ALBUMIN 4.2 04/20/2015 0816    RADIOGRAPHIC STUDIES: Nm Pet Image Initial (pi) Skull Base To Thigh  Result Date: 01/26/2019 CLINICAL DATA:  Initial treatment strategy for liver masses and retroperitoneal adenopathy. EXAM: NUCLEAR MEDICINE PET SKULL BASE TO THIGH TECHNIQUE: 10.9 mCi F-18 FDG was injected intravenously. Full-ring PET imaging was performed from the skull base to thigh after the radiotracer. CT data was obtained and used for attenuation correction and anatomic localization. Fasting blood glucose: 102 mg/dl COMPARISON:  Multiple exams, including CT from 01/14/2019 and ultrasound from 01/12/2019 FINDINGS:  Mediastinal blood pool activity: SUV max 2.5 Liver activity: SUV max NA NECK: 0.8 cm left level IV lymph node on image 69/3 with maximum SUV 3.7. Incidental CT findings: Bilateral common carotid atherosclerotic calcification. CHEST: No significant abnormal hypermetabolic activity in this region. Incidental CT findings: Mild cardiomegaly. Pacer/AICD. No hypermetabolic or pathologically enlarged adenopathy in the chest. ABDOMEN/PELVIS: Hypermetabolic scattered hepatic activity corresponding to the innumerable hepatic nodule shown on recent PET-CT. The confluent mass involving the caudate lobe of the liver has a maximum SUV of 11.1. Multifocal hypermetabolic activity in segment 4b of the liver has maximum SUV of 8.4. Other areas of confluence of hypermetabolic activity include the dome of segment 2 laterally in segment 4a of the liver, but there scattered hypermetabolic nodules throughout the liver. Just below the caudate lobe a hypermetabolic lymph node measuring approximately 2.6 cm in short axis on image 173/3 has a maximum SUV of 10.1. Additional hypermetabolic porta hepatis and retroperitoneal lymph nodes are present. An index left periaortic lymph node measuring 1.2 cm in short axis on image 176/3 has maximum SUV of 6.6. Small hypermetabolic peripancreatic lymph nodes are present. I do not observe a definite hypermetabolic pancreatic mass. There is some accentuated metabolic activity throughout much of the ascending colon, without CT correlate. No hypermetabolic small bowel mass is identified. There is some accentuated activity in the vicinity of the anus, maximum SUV 7.7. Incidental CT findings: Scalloped contour of the left mid to lower kidney posteriorly with underlying mild precontrast accentuated density, as on image 193/3. This may be from localized scarring although the hyperdense appearance is slightly unusual. I do not observe abnormal accentuated metabolic activity in this vicinity compared to the rest  of kidney. Aortoiliac atherosclerotic vascular disease. SKELETON: Diffuse mildly accentuated activity throughout the skeleton with some mild heterogeneity but without CT correlate. Representative activity in the left upper sacral ala maximum SUV 6.3. I do not see obvious asymmetry. Incidental CT findings: none IMPRESSION: 1. Hypermetabolic masses are scattered in the liver, with the largest/most confluent in the caudate lobe with maximum SUV of 11.1. 2. There is adjacent hypermetabolic porta hepatis and retroperitoneal adenopathy.  3. I do not identify a definite separate primary. There is some accentuated activity throughout the ascending colon but without CT correlate, this probably physiologic. There is also some localized activity at the anus, probably physiologic, although correlation with digital rectal exam is suggested. The lack of a definite separate primary raises the possibility of a primary liver lesion such as cholangiocarcinoma or hepatocellular carcinoma. 4. Outside of the abdomen, note is made of a 0.8 cm left neck level IV lymph node with maximum SUV of 3.7 which is mildly above the blood pool. This lesion is nonspecific but a metastatic lesion is not totally excluded. 5. There is diffuse accentuated activity throughout the skeleton with some mild heterogeneity but without a CT correlate or significant asymmetry. Electronically Signed   By: Van Clines M.D.   On: 01/26/2019 13:11   US Biopsy (liver)  Result Date: 01/27/2019 INDICATION: Multiple liver lesions throughout the liver with no current known source for metastatic disease. The patient presents for biopsy of the liver. EXAM: ULTRASOUND GUIDED CORE BIOPSY OF LIVER MEDICATIONS: None. ANESTHESIA/SEDATION: Fentanyl 75 mcg IV; Versed 2.0 mg IV Moderate Sedation Time:  13 minutes. The patient was continuously monitored during the procedure by the interventional radiology nurse under my direct supervision. PROCEDURE: The procedure, risks,  benefits, and alternatives were explained to the patient. Questions regarding the procedure were encouraged and answered. The patient understands and consents to the procedure. A time-out was performed prior to initiating the procedure. Ultrasound was initially performed of the liver to localize lesions. The abdominal wall was prepped with chlorhexidine in a sterile fashion, and a sterile drape was applied covering the operative field. A sterile gown and sterile gloves were used for the procedure. Local anesthesia was provided with 1% Lidocaine. Under ultrasound guidance, a 17 gauge trocar needle was advanced into the left lobe of the liver. Three separate 18 gauge core biopsy samples were then obtained under ultrasound guidance with an automated device. Core biopsy samples were submitted in formalin. Cut Gel-Foam pledgets were advanced through the outer needle as the needle was retracted. Additional ultrasound was performed. COMPLICATIONS: None immediate. FINDINGS: Innumerable hypoechoic lesions are seen throughout the liver parenchyma. Adjacent lesions were chosen in the medial segment of the left lobe of the liver with the largest measuring approximately 1.6 cm. Solid tissue was obtained. IMPRESSION: Ultrasound-guided core biopsy performed of lesions in the left lobe of the liver. Electronically Signed   By: Aletta Edouard M.D.   On: 01/27/2019 10:44   Ir Imaging Guided Port Insertion  Result Date: 02/13/2019 INDICATION: Poor venous access. In need of durable intravenous access for chemotherapy administration EXAM: IMPLANTED PORT A CATH PLACEMENT WITH ULTRASOUND AND FLUOROSCOPIC GUIDANCE COMPARISON:  None. MEDICATIONS: Ancef 2 gm IV; The antibiotic was administered within an appropriate time interval prior to skin puncture. ANESTHESIA/SEDATION: Moderate (conscious) sedation was employed during this procedure. A total of Versed 3 mg and Fentanyl 100 mcg was administered intravenously. Moderate Sedation Time:  27 minutes. The patient's level of consciousness and vital signs were monitored continuously by radiology nursing throughout the procedure under my direct supervision. CONTRAST:  None FLUOROSCOPY TIME:  24 seconds (6.1 mGy) COMPLICATIONS: None immediate. PROCEDURE: The procedure, risks, benefits, and alternatives were explained to the patient. Questions regarding the procedure were encouraged and answered. The patient understands and consents to the procedure. The right neck and chest were prepped with chlorhexidine in a sterile fashion, and a sterile drape was applied covering the operative field. Maximum barrier sterile  technique with sterile gowns and gloves were used for the procedure. A timeout was performed prior to the initiation of the procedure. Local anesthesia was provided with 1% lidocaine with epinephrine. After creating a small venotomy incision, a micropuncture kit was utilized to access the internal jugular vein. Real-time ultrasound guidance was utilized for vascular access including the acquisition of a permanent ultrasound image documenting patency of the accessed vessel. The microwire was utilized to measure appropriate catheter length. A subcutaneous port pocket was then created along the upper chest wall utilizing a combination of sharp and blunt dissection. The pocket was irrigated with sterile saline. A single lumen power injectable port was chosen for placement. The 8 Fr catheter was tunneled from the port pocket site to the venotomy incision. The port was placed in the pocket. The external catheter was trimmed to appropriate length. At the venotomy, an 8 Fr peel-away sheath was placed over a guidewire under fluoroscopic guidance. The catheter was then placed through the sheath and the sheath was removed. Final catheter positioning was confirmed and documented with a fluoroscopic spot radiograph. The port was accessed with a Huber needle, aspirated and flushed with heparinized saline. The  venotomy site was closed with an interrupted 4-0 Vicryl suture. The port pocket incision was closed with interrupted 3-0 Vicryl suture and the skin was opposed with a running subcuticular 4-0 Vicryl suture. Dermabond and Steri-strips were applied to both incisions. Dressings were placed. The patient tolerated the procedure well without immediate post procedural complication. FINDINGS: After catheter placement, the tip lies within the superior cavoatrial junction. The catheter aspirates and flushes normally and is ready for immediate use. IMPRESSION: Successful placement of a right internal jugular approach power injectable Port-A-Cath. The catheter is ready for immediate use. Electronically Signed   By: Sandi Mariscal M.D.   On: 02/13/2019 09:55    PERFORMANCE STATUS (ECOG) : 1 - Symptomatic but completely ambulatory  Review of Systems Unless otherwise noted, a complete review of systems is negative.  Physical Exam General: NAD, frail appearing, thin Pulmonary: Unlabored Extremities: no edema, no joint deformities Skin: no rashes Neurological: Weakness but otherwise nonfocal  IMPRESSION: Met with patient briefly following his appointment with Dr. Grayland Ormond.  Introduced palliative care services and attempted to establish therapeutic rapport.  Patient feels generally lousy following his chemotherapy.  He has had weakness and fatigue, poor oral intake, and persistent insomnia.  Patient says that he thinks all his symptoms would be better if only he could get some sleep.  He describes difficulty with initiation and maintaining sleep.  He reports sleeping 30 minutes to an hour and then waking up and being unable to return to sleep.  He denies daytime napping.  He is supposed to wear a CPAP but has been unable to tolerate.  He has been taking Xanax at bedtime but does not feel it is helped.  He also feels "spacey" in the morning and attributes that to the alprazolam.  We will trial trazodone.  Patient has  had a substantial amount of weight loss in the previous months.  He reports poor oral intake.  Will trial Megace.  Would consider mirtazapine for both sleep and appetite but patient thinks he has tried it in the past, although I could not locate it on his medication history.  Would also consider trial of dexamethasone but it would potentially exacerbate his insomnia.  We will plan to follow-up with patient and discuss his goals and advance care planning in more detail when he  is feeling better.  I tried calling patient's wife but did not reach her.  Case and plan discussed with Dr. Grayland Ormond.  PLAN: -Continue current prescription treatment -Megace 40 mg daily -Trial trazodone 25 to 50 mg nightly as needed for sleep -Referral RD -RTC in 1 to 2 weeks   Patient expressed understanding and was in agreement with this plan. He also understands that He can call the clinic at any time with any questions, concerns, or complaints.     Time Total: 30 minutes  Visit consisted of counseling and education dealing with the complex and emotionally intense issues of symptom management and palliative care in the setting of serious and potentially life-threatening illness.Greater than 50%  of this time was spent counseling and coordinating care related to the above assessment and plan.  Signed by: Altha Harm, PhD, NP-C [phone] (Work Cell)

## 2019-02-24 NOTE — Progress Notes (Signed)
Pt in for follow up, reports unable to eat, sleep since Tuesday. Pt reports nausea not relived by medications.

## 2019-02-26 ENCOUNTER — Ambulatory Visit: Payer: PPO | Admitting: Gastroenterology

## 2019-02-27 NOTE — Progress Notes (Signed)
[place]  [phone] [phone]) [phone]  ID: Jose Dunlap OB: July 10, 1950  MR#: [id number]  AOZ#:[id number]  Patient Care Team: Olin Hauser, DO as PCP - General (Family Medicine)  CHIEF COMPLAINT: Cholangiocarcinoma  INTERVAL HISTORY: Patient returns to clinic today for further evaluation and consideration of cycle 1, day 15 of cisplatin and gemcitabine.  Gemcitabine only today.  He continues to have weakness and fatigue, but state he felt significantly improved this week.  His appetite has improved and he is sleeping better.  He does not complain of pain.  He has no neurologic complaints.  He denies any recent fevers or illnesses.  He has no chest pain, shortness of breath, or hemoptysis.  He continues to have chronic cough.  He denies any nausea, vomiting, constipation, or diarrhea.  He has no melena or hematochezia.  He has no urinary complaints.  Patient offers no further specific complaints today.  REVIEW OF SYSTEMS:   Review of Systems  Constitutional: Positive for malaise/fatigue. Negative for fever and weight loss.  Respiratory: Positive for cough. Negative for hemoptysis and shortness of breath.   Cardiovascular: Negative.  Negative for chest pain and leg swelling.  Gastrointestinal: Negative.  Negative for abdominal pain, blood in stool, constipation, diarrhea, melena, nausea and vomiting.  Genitourinary: Negative.  Negative for dysuria.  Musculoskeletal: Negative.  Negative for back pain.  Skin: Negative.  Negative for rash.  Neurological: Positive for weakness. Negative for dizziness, focal weakness and headaches.  Psychiatric/Behavioral: Negative.  The patient is not nervous/anxious and does not have insomnia.     As per HPI. Otherwise, a complete review of systems is negative.  PAST MEDICAL HISTORY: Past Medical History:  Diagnosis Date   Essential hypertension    Hyperlipidemia    Liver cancer ([place])    MI  (myocardial infarction) ([place])    Obesity    Pacemaker    PACER/DEFIBRILLATOR [address] [place] [employer]   Shortness of breath dyspnea     PAST SURGICAL HISTORY: Past Surgical History:  Procedure Laterality Date   ABDOMINAL HERNIA REPAIR  2001   BACK SURGERY  1986   COLONOSCOPY WITH PROPOFOL N/A 05/19/2015   Procedure: COLONOSCOPY WITH PROPOFOL;  Surgeon: Hulen Luster, MD;  Location: [facility] ENDOSCOPY;  Service: Gastroenterology;  Laterality: N/A;   CORONARY ANGIOPLASTY     INSERT / REPLACE / REMOVE PACEMAKER     IR IMAGING GUIDED PORT INSERTION  02/13/2019   KNEE ARTHROSCOPY     PACEMAKER PLACEMENT     PROSTATE SURGERY     TONSILLECTOMY      FAMILY HISTORY: History reviewed. No pertinent family history.  ADVANCED DIRECTIVES (Y/N):  N  HEALTH MAINTENANCE: Social History   Tobacco Use   Smoking status: Former Smoker   Smokeless tobacco: Former Network engineer Use Topics   Alcohol use: Not Currently   Drug use: No     Colonoscopy:  PAP:  Bone density:  Lipid panel:  No Known Allergies  Current Outpatient Medications  Medication Sig Dispense Refill   albuterol (VENTOLIN HFA) 108 (90 Base) MCG/ACT inhaler Inhale 2 puffs into the lungs every 4 (four) hours as needed for wheezing or shortness of breath (cough). 8 g 0   ALPRAZolam (XANAX) 0.5 MG tablet Take 1 tablet (0.5 mg total) by mouth at bedtime as needed for anxiety. 30 tablet 1   aspirin 81 MG tablet Chew 81 mg by mouth daily.     benzonatate (TESSALON) 100 MG capsule  Take 1 capsule (100 mg total) by mouth 3 (three) times daily as needed for cough. 90 capsule 0   Cholecalciferol (D 1000) 1000 units capsule Take 1 capsule by mouth daily.     clopidogrel (PLAVIX) 75 MG tablet Take 1 tablet (75 mg total) by mouth daily. 90 tablet 1   fluticasone (FLONASE) 50 MCG/ACT nasal spray Place 2 sprays into both nostrils daily. 16 g 6   Icosapent Ethyl (VASCEPA) 1 g CAPS Take 2 g by mouth 2 (two) times  daily. 120 capsule 5   lidocaine-prilocaine (EMLA) cream Apply to affected area once 30 g 3   megestrol (MEGACE) 40 MG tablet Take 1 tablet (40 mg total) by mouth daily. 30 tablet 1   Multiple Vitamin (MULTIVITAMIN) capsule Take 1 capsule by mouth daily.     nebivolol (BYSTOLIC) 5 MG tablet Take 1 tablet (5 mg total) by mouth daily. 90 tablet 1   nitroGLYCERIN (NITROSTAT) 0.4 MG SL tablet Place 1 tablet (0.4 mg total) under the tongue as needed. 25 tablet 4   Omega-3 Fatty Acids (FISH OIL PO) Take 1 capsule by mouth 2 (two) times daily.      ondansetron (ZOFRAN) 8 MG tablet Take 1 tablet (8 mg total) by mouth 2 (two) times daily as needed. 30 tablet 2   prochlorperazine (COMPAZINE) 10 MG tablet Take 1 tablet (10 mg total) by mouth every 6 (six) hours as needed (Nausea or vomiting). 60 tablet 2   traZODone (DESYREL) 50 MG tablet Take 0.5-1 tablets (25-50 mg total) by mouth at bedtime as needed for sleep. 30 tablet 1   vitamin B-12 (CYANOCOBALAMIN) 250 MCG tablet Take 1 tablet by mouth daily.     Magnesium Cl-Calcium Carbonate (SLOW MAGNESIUM/CALCIUM) 70-117 MG TBEC Take 1 tablet by mouth daily.      No current facility-administered medications for this visit.     OBJECTIVE: Vitals:   03/03/19 0931  BP: 111/70  Pulse: 75  Resp: 18  Temp: (!) 97.5 F (36.4 C)     Body mass index is 25.76 kg/m.    ECOG FS:2 - Symptomatic, <50% confined to bed  General: No acute distress. Eyes: Pink conjunctiva, anicteric sclera. HEENT: Normocephalic, moist mucous membranes. Lungs: Clear to auscultation bilaterally. Heart: Regular rate and rhythm. No rubs, murmurs, or gallops. Abdomen: Soft, nontender, nondistended. No organomegaly noted, normoactive bowel sounds. Musculoskeletal: No edema, cyanosis, or clubbing. Neuro: Alert, answering all questions appropriately. Cranial nerves grossly intact. Skin: No rashes or petechiae noted. Psych: Normal affect.  LAB RESULTS:  Lab Results    Component Value Date   NA 136 03/03/2019   K 4.3 03/03/2019   CL 103 03/03/2019   CO2 24 03/03/2019   GLUCOSE 98 03/03/2019   BUN 12 03/03/2019   CREATININE 0.68 03/03/2019   CALCIUM 8.8 (L) 03/03/2019   PROT 6.8 03/03/2019   ALBUMIN 2.8 (L) 03/03/2019   AST 38 03/03/2019   ALT 37 03/03/2019   ALKPHOS 489 (H) 03/03/2019   BILITOT 1.0 03/03/2019   GFRNONAA >60 03/03/2019   GFRAA >60 03/03/2019    Lab Results  Component Value Date   WBC 8.6 03/03/2019   NEUTROABS 7.0 03/03/2019   HGB 8.2 (L) 03/03/2019   HCT 25.2 (L) 03/03/2019   MCV 74.8 (L) 03/03/2019   PLT 77 (L) 03/03/2019     STUDIES: Ir Imaging Guided Port Insertion  Result Date: 02/13/2019 INDICATION: Poor venous access. In need of durable intravenous access for chemotherapy administration EXAM: IMPLANTED PORT A  CATH PLACEMENT WITH ULTRASOUND AND FLUOROSCOPIC GUIDANCE COMPARISON:  None. MEDICATIONS: Ancef 2 gm IV; The antibiotic was administered within an appropriate time interval prior to skin puncture. ANESTHESIA/SEDATION: Moderate (conscious) sedation was employed during this procedure. A total of Versed 3 mg and Fentanyl 100 mcg was administered intravenously. Moderate Sedation Time: 27 minutes. The patient's level of consciousness and vital signs were monitored continuously by radiology nursing throughout the procedure under my direct supervision. CONTRAST:  None FLUOROSCOPY TIME:  24 seconds (6.1 mGy) COMPLICATIONS: None immediate. PROCEDURE: The procedure, risks, benefits, and alternatives were explained to the patient. Questions regarding the procedure were encouraged and answered. The patient understands and consents to the procedure. The right neck and chest were prepped with chlorhexidine in a sterile fashion, and a sterile drape was applied covering the operative field. Maximum barrier sterile technique with sterile gowns and gloves were used for the procedure. A timeout was performed prior to the initiation of the  procedure. Local anesthesia was provided with 1% lidocaine with epinephrine. After creating a small venotomy incision, a micropuncture kit was utilized to access the internal jugular vein. Real-time ultrasound guidance was utilized for vascular access including the acquisition of a permanent ultrasound image documenting patency of the accessed vessel. The microwire was utilized to measure appropriate catheter length. A subcutaneous port pocket was then created along the upper chest wall utilizing a combination of sharp and blunt dissection. The pocket was irrigated with sterile saline. A single lumen power injectable port was chosen for placement. The 8 Fr catheter was tunneled from the port pocket site to the venotomy incision. The port was placed in the pocket. The external catheter was trimmed to appropriate length. At the venotomy, an 8 Fr peel-away sheath was placed over a guidewire under fluoroscopic guidance. The catheter was then placed through the sheath and the sheath was removed. Final catheter positioning was confirmed and documented with a fluoroscopic spot radiograph. The port was accessed with a Huber needle, aspirated and flushed with heparinized saline. The venotomy site was closed with an interrupted 4-0 Vicryl suture. The port pocket incision was closed with interrupted 3-0 Vicryl suture and the skin was opposed with a running subcuticular 4-0 Vicryl suture. Dermabond and Steri-strips were applied to both incisions. Dressings were placed. The patient tolerated the procedure well without immediate post procedural complication. FINDINGS: After catheter placement, the tip lies within the superior cavoatrial junction. The catheter aspirates and flushes normally and is ready for immediate use. IMPRESSION: Successful placement of a right internal jugular approach power injectable Port-A-Cath. The catheter is ready for immediate use. Electronically Signed   By: Sandi Mariscal M.D.   On: 02/13/2019 09:55     ASSESSMENT: Cholangiocarcinoma.  PLAN:    1.Cholangiocarcinoma: PET scan results from January 26, 2019 reviewed independently with confluence hypermetabolic masses throughout the liver as well as porta hepatis and retroperitoneal lymphadenopathy.  Patient has a mildly's elevated CEA at 7.1, but the remainder of his tumor markers are either negative or within normal limits.  Case was discussed with pathology and the most likely diagnosis is cholangiocarcinoma.  Patient wishes to pursue palliative chemotherapy.  Patient has now had port placed.  Delay cycle 1, day 15 of treatment today which is gemcitabine only secondary to thrombocytopenia.  Return to clinic in 1 week for further evaluation and reconsideration of treatment.    2.  Leukocytosis: Resolved. 3.  Anemia: Patient's hemoglobin continues to trend down and is now 8.2.  He has a mild iron  deficiency and may benefit from IV Feraheme in the future.   4.  Insomnia/anxiety: Improved.  Appreciate palliative care input. 5.  Cough: Continue Tessalon Perles as needed. 6.  Poor appetite: Improved.  Continue Megace as prescribed. 7.  Thrombocytopenia: Delay treatment 1 week as above.  Patient expressed understanding and was in agreement with this plan. He also understands that He can call clinic at any time with any questions, concerns, or complaints.   Cancer Staging No matching staging information was found for the patient.  Lloyd Huger, MD   03/03/2019 4:11 PM

## 2019-03-03 ENCOUNTER — Encounter: Payer: Self-pay | Admitting: Oncology

## 2019-03-03 ENCOUNTER — Inpatient Hospital Stay: Payer: PPO

## 2019-03-03 ENCOUNTER — Inpatient Hospital Stay (HOSPITAL_BASED_OUTPATIENT_CLINIC_OR_DEPARTMENT_OTHER): Payer: PPO | Admitting: Oncology

## 2019-03-03 ENCOUNTER — Other Ambulatory Visit: Payer: Self-pay

## 2019-03-03 VITALS — BP 111/70 | HR 75 | Temp 97.5°F | Resp 18 | Wt 189.9 lb

## 2019-03-03 DIAGNOSIS — C221 Intrahepatic bile duct carcinoma: Secondary | ICD-10-CM | POA: Diagnosis not present

## 2019-03-03 DIAGNOSIS — Z5111 Encounter for antineoplastic chemotherapy: Secondary | ICD-10-CM | POA: Diagnosis not present

## 2019-03-03 LAB — CBC WITH DIFFERENTIAL/PLATELET
Abs Immature Granulocytes: 0.08 10*3/uL — ABNORMAL HIGH (ref 0.00–0.07)
Basophils Absolute: 0.1 10*3/uL (ref 0.0–0.1)
Basophils Relative: 1 %
Eosinophils Absolute: 0 10*3/uL (ref 0.0–0.5)
Eosinophils Relative: 1 %
HCT: 25.2 % — ABNORMAL LOW (ref 39.0–52.0)
Hemoglobin: 8.2 g/dL — ABNORMAL LOW (ref 13.0–17.0)
Immature Granulocytes: 1 %
Lymphocytes Relative: 9 %
Lymphs Abs: 0.8 10*3/uL (ref 0.7–4.0)
MCH: 24.3 pg — ABNORMAL LOW (ref 26.0–34.0)
MCHC: 32.5 g/dL (ref 30.0–36.0)
MCV: 74.8 fL — ABNORMAL LOW (ref 80.0–100.0)
Monocytes Absolute: 0.7 10*3/uL (ref 0.1–1.0)
Monocytes Relative: 8 %
Neutro Abs: 7 10*3/uL (ref 1.7–7.7)
Neutrophils Relative %: 80 %
Platelets: 77 10*3/uL — ABNORMAL LOW (ref 150–400)
RBC: 3.37 MIL/uL — ABNORMAL LOW (ref 4.22–5.81)
RDW: 18.8 % — ABNORMAL HIGH (ref 11.5–15.5)
WBC: 8.6 10*3/uL (ref 4.0–10.5)
nRBC: 0 % (ref 0.0–0.2)

## 2019-03-03 LAB — COMPREHENSIVE METABOLIC PANEL
ALT: 37 U/L (ref 0–44)
AST: 38 U/L (ref 15–41)
Albumin: 2.8 g/dL — ABNORMAL LOW (ref 3.5–5.0)
Alkaline Phosphatase: 489 U/L — ABNORMAL HIGH (ref 38–126)
Anion gap: 9 (ref 5–15)
BUN: 12 mg/dL (ref 8–23)
CO2: 24 mmol/L (ref 22–32)
Calcium: 8.8 mg/dL — ABNORMAL LOW (ref 8.9–10.3)
Chloride: 103 mmol/L (ref 98–111)
Creatinine, Ser: 0.68 mg/dL (ref 0.61–1.24)
GFR calc Af Amer: >60 mL/min (ref >60)
GFR calc non Af Amer: >60 mL/min (ref >60)
Glucose, Bld: 98 mg/dL (ref 70–99)
Potassium: 4.3 mmol/L (ref 3.5–5.1)
Sodium: 136 mmol/L (ref 135–145)
Total Bilirubin: 1 mg/dL (ref 0.3–1.2)
Total Protein: 6.8 g/dL (ref 6.5–8.1)

## 2019-03-03 MED ORDER — SODIUM CHLORIDE 0.9% FLUSH
10.0000 mL | Freq: Once | INTRAVENOUS | Status: AC
  Administered 2019-03-03: 10 mL via INTRAVENOUS
  Filled 2019-03-03: qty 10

## 2019-03-03 MED ORDER — HEPARIN SOD (PORK) LOCK FLUSH 100 UNIT/ML IV SOLN
INTRAVENOUS | Status: AC
  Filled 2019-03-03: qty 5

## 2019-03-03 MED ORDER — HEPARIN SOD (PORK) LOCK FLUSH 100 UNIT/ML IV SOLN
500.0000 [IU] | Freq: Once | INTRAVENOUS | Status: AC
  Administered 2019-03-03: 500 [IU] via INTRAVENOUS

## 2019-03-03 NOTE — Progress Notes (Signed)
Pt in for follow up reports improved appetite and sleeping better.

## 2019-03-05 ENCOUNTER — Telehealth: Payer: Self-pay | Admitting: *Deleted

## 2019-03-05 MED ORDER — LACTULOSE 10 GM/15ML PO SOLN: 10.0000 g | Freq: Three times a day (TID) | ORAL | 1 refills | Status: AC | PRN

## 2019-03-05 NOTE — Telephone Encounter (Signed)
Patient called reporting that he is severely constipated and the over the counter laxatives and metamucil are not working. He is asking if doctor will order the medicine you take before a colonoscopy for him

## 2019-03-05 NOTE — Telephone Encounter (Signed)
Left message on patient voice mail that prescription sent to [facility] in [place]

## 2019-03-05 NOTE — Telephone Encounter (Signed)
He can have lactulose.

## 2019-03-07 NOTE — Progress Notes (Signed)
[place]  Telephone:(336) [phone] Fax:(336) [phone]  ID: Jose Dunlap OB: 11-07-1950  MR#: [id number]  RXY#:[id number]  Patient Care Team: Olin Hauser, DO as PCP - General (Family Medicine)  CHIEF COMPLAINT: Cholangiocarcinoma  INTERVAL HISTORY: Patient returns to clinic today for further evaluation and reconsideration of cycle 1, day 15 of cisplatin and gemcitabine.  Gemcitabine only today.  His only complaint this week is of significant constipation.  Patient states that this were resolved, he would feel nearly back to his baseline. He does not complain of pain.  He has no neurologic complaints.  He denies any recent fevers or illnesses.  He has no chest pain, shortness of breath, or hemoptysis.  He continues to have chronic cough.  He denies any nausea, vomiting, constipation, or diarrhea.  He has no melena or hematochezia.  He has no urinary complaints.  Patient offers no further specific complaints today.  REVIEW OF SYSTEMS:   Review of Systems  Constitutional: Positive for malaise/fatigue. Negative for fever and weight loss.  Respiratory: Positive for cough. Negative for hemoptysis and shortness of breath.   Cardiovascular: Negative.  Negative for chest pain and leg swelling.  Gastrointestinal: Positive for constipation. Negative for abdominal pain, blood in stool, diarrhea, melena, nausea and vomiting.  Genitourinary: Negative.  Negative for dysuria.  Musculoskeletal: Negative.  Negative for back pain.  Skin: Negative.  Negative for rash.  Neurological: Positive for weakness. Negative for dizziness, focal weakness and headaches.  Psychiatric/Behavioral: Negative.  The patient is not nervous/anxious and does not have insomnia.     As per HPI. Otherwise, a complete review of systems is negative.  PAST MEDICAL HISTORY: Past Medical History:  Diagnosis Date  . Essential hypertension   . Hyperlipidemia   . Liver cancer ([place])   . MI  (myocardial infarction) ([place])   . Obesity   . Pacemaker    PACER/DEFIBRILLATOR [address] [place]  . Shortness of breath dyspnea     PAST SURGICAL HISTORY: Past Surgical History:  Procedure Laterality Date  . ABDOMINAL HERNIA REPAIR  2001  . BACK SURGERY  1986  . COLONOSCOPY WITH PROPOFOL N/A 05/19/2015   Procedure: COLONOSCOPY WITH PROPOFOL;  Surgeon: Hulen Luster, MD;  Location: [facility] ENDOSCOPY;  Service: Gastroenterology;  Laterality: N/A;  . CORONARY ANGIOPLASTY    . INSERT / REPLACE / REMOVE PACEMAKER    . IR IMAGING GUIDED PORT INSERTION  02/13/2019  . KNEE ARTHROSCOPY    . PACEMAKER PLACEMENT    . PROSTATE SURGERY    . TONSILLECTOMY      FAMILY HISTORY: History reviewed. No pertinent family history.  ADVANCED DIRECTIVES (Y/N):  N  HEALTH MAINTENANCE: Social History   Tobacco Use  . Smoking status: Former Research scientist (life sciences)  . Smokeless tobacco: Former Network engineer Use Topics  . Alcohol use: Not Currently  . Drug use: No     Colonoscopy:  PAP:  Bone density:  Lipid panel:  No Known Allergies  Current Outpatient Medications  Medication Sig Dispense Refill  . ALPRAZolam (XANAX) 0.5 MG tablet Take 1 tablet (0.5 mg total) by mouth at bedtime as needed for anxiety. 30 tablet 1  . aspirin 81 MG tablet Chew 81 mg by mouth daily.    . Cholecalciferol (D 1000) 1000 units capsule Take 1 capsule by mouth daily.    . clopidogrel (PLAVIX) 75 MG tablet Take 1 tablet (75 mg total) by mouth daily. 90 tablet 1  . lactulose (CHRONULAC) 10 GM/15ML  solution Take 15 mLs (10 g total) by mouth 3 (three) times daily as needed for mild constipation. 240 mL 1  . lidocaine-prilocaine (EMLA) cream Apply to affected area once 30 g 3  . Magnesium Cl-Calcium Carbonate (SLOW MAGNESIUM/CALCIUM) 70-117 MG TBEC Take 1 tablet by mouth daily.     . megestrol (MEGACE) 40 MG tablet Take 1 tablet (40 mg total) by mouth daily. 30 tablet 1  . Multiple Vitamin (MULTIVITAMIN) capsule Take 1 capsule by  mouth daily.    . nebivolol (BYSTOLIC) 5 MG tablet Take 1 tablet (5 mg total) by mouth daily. 90 tablet 1  . nitroGLYCERIN (NITROSTAT) 0.4 MG SL tablet Place 1 tablet (0.4 mg total) under the tongue as needed. 25 tablet 4  . ondansetron (ZOFRAN) 8 MG tablet Take 1 tablet (8 mg total) by mouth 2 (two) times daily as needed. 30 tablet 2  . prochlorperazine (COMPAZINE) 10 MG tablet Take 1 tablet (10 mg total) by mouth every 6 (six) hours as needed (Nausea or vomiting). 60 tablet 2  . vitamin B-12 (CYANOCOBALAMIN) 250 MCG tablet Take 1 tablet by mouth daily.    Marland Kitchen albuterol (VENTOLIN HFA) 108 (90 Base) MCG/ACT inhaler Inhale 2 puffs into the lungs every 4 (four) hours as needed for wheezing or shortness of breath (cough). (Patient not taking: Reported on 03/10/2019) 8 g 0  . benzonatate (TESSALON) 100 MG capsule Take 1 capsule (100 mg total) by mouth 3 (three) times daily as needed for cough. (Patient not taking: Reported on 03/10/2019) 90 capsule 0  . fluticasone (FLONASE) 50 MCG/ACT nasal spray Place 2 sprays into both nostrils daily. (Patient not taking: Reported on 03/10/2019) 16 g 6  . Icosapent Ethyl (VASCEPA) 1 g CAPS Take 2 g by mouth 2 (two) times daily. (Patient not taking: Reported on 03/10/2019) 120 capsule 5  . Omega-3 Fatty Acids (FISH OIL PO) Take 1 capsule by mouth 2 (two) times daily.     Marland Kitchen PEG 3350-KCl-NaBcb-NaCl-NaSulf (GOLYTELY) 227.1 g PACK Take 1 each by mouth once for 1 dose. 1 each 0  . traZODone (DESYREL) 50 MG tablet Take 0.5-1 tablets (25-50 mg total) by mouth at bedtime as needed for sleep. 30 tablet 1   No current facility-administered medications for this visit.     OBJECTIVE: Vitals:   03/10/19 1032  BP: 114/82  Pulse: (!) 101  Resp: (!) 24  Temp: 98 F (36.7 C)     Body mass index is 26 kg/m.    ECOG FS:1 - Symptomatic but completely ambulatory  General: Thin, no acute distress. Eyes: Pink conjunctiva, anicteric sclera. HEENT: Normocephalic, moist mucous membranes,  clear oropharnyx. Lungs: Clear to auscultation bilaterally. Heart: Regular rate and rhythm. No rubs, murmurs, or gallops. Abdomen: Soft, nontender, nondistended. No organomegaly noted, normoactive bowel sounds. Musculoskeletal: No edema, cyanosis, or clubbing. Neuro: Alert, answering all questions appropriately. Cranial nerves grossly intact. Skin: No rashes or petechiae noted. Psych: Normal affect.  LAB RESULTS:  Lab Results  Component Value Date   NA 132 (L) 03/10/2019   K 4.3 03/10/2019   CL 100 03/10/2019   CO2 20 (L) 03/10/2019   GLUCOSE 126 (H) 03/10/2019   BUN 14 03/10/2019   CREATININE 0.72 03/10/2019   CALCIUM 8.6 (L) 03/10/2019   PROT 7.3 03/10/2019   ALBUMIN 2.9 (L) 03/10/2019   AST 56 (H) 03/10/2019   ALT 20 03/10/2019   ALKPHOS 535 (H) 03/10/2019   BILITOT 0.9 03/10/2019   GFRNONAA >60 03/10/2019   GFRAA >60  03/10/2019    Lab Results  Component Value Date   WBC 29.4 (H) 03/10/2019   NEUTROABS 23.9 (H) 03/10/2019   HGB 8.2 (L) 03/10/2019   HCT 25.9 (L) 03/10/2019   MCV 76.0 (L) 03/10/2019   PLT 1,011 ([place]) 03/10/2019     STUDIES: Ir Imaging Guided Port Insertion  Result Date: 02/13/2019 INDICATION: Poor venous access. In need of durable intravenous access for chemotherapy administration EXAM: IMPLANTED PORT A CATH PLACEMENT WITH ULTRASOUND AND FLUOROSCOPIC GUIDANCE COMPARISON:  None. MEDICATIONS: Ancef 2 gm IV; The antibiotic was administered within an appropriate time interval prior to skin puncture. ANESTHESIA/SEDATION: Moderate (conscious) sedation was employed during this procedure. A total of Versed 3 mg and Fentanyl 100 mcg was administered intravenously. Moderate Sedation Time: 27 minutes. The patient's level of consciousness and vital signs were monitored continuously by radiology nursing throughout the procedure under my direct supervision. CONTRAST:  None FLUOROSCOPY TIME:  24 seconds (6.1 mGy) COMPLICATIONS: None immediate. PROCEDURE: The procedure,  risks, benefits, and alternatives were explained to the patient. Questions regarding the procedure were encouraged and answered. The patient understands and consents to the procedure. The right neck and chest were prepped with chlorhexidine in a sterile fashion, and a sterile drape was applied covering the operative field. Maximum barrier sterile technique with sterile gowns and gloves were used for the procedure. A timeout was performed prior to the initiation of the procedure. Local anesthesia was provided with 1% lidocaine with epinephrine. After creating a small venotomy incision, a micropuncture kit was utilized to access the internal jugular vein. Real-time ultrasound guidance was utilized for vascular access including the acquisition of a permanent ultrasound image documenting patency of the accessed vessel. The microwire was utilized to measure appropriate catheter length. A subcutaneous port pocket was then created along the upper chest wall utilizing a combination of sharp and blunt dissection. The pocket was irrigated with sterile saline. A single lumen power injectable port was chosen for placement. The 8 Fr catheter was tunneled from the port pocket site to the venotomy incision. The port was placed in the pocket. The external catheter was trimmed to appropriate length. At the venotomy, an 8 Fr peel-away sheath was placed over a guidewire under fluoroscopic guidance. The catheter was then placed through the sheath and the sheath was removed. Final catheter positioning was confirmed and documented with a fluoroscopic spot radiograph. The port was accessed with a Huber needle, aspirated and flushed with heparinized saline. The venotomy site was closed with an interrupted 4-0 Vicryl suture. The port pocket incision was closed with interrupted 3-0 Vicryl suture and the skin was opposed with a running subcuticular 4-0 Vicryl suture. Dermabond and Steri-strips were applied to both incisions. Dressings were  placed. The patient tolerated the procedure well without immediate post procedural complication. FINDINGS: After catheter placement, the tip lies within the superior cavoatrial junction. The catheter aspirates and flushes normally and is ready for immediate use. IMPRESSION: Successful placement of a right internal jugular approach power injectable Port-A-Cath. The catheter is ready for immediate use. Electronically Signed   By: Sandi Mariscal M.D.   On: 02/13/2019 09:55    ASSESSMENT: Cholangiocarcinoma.  PLAN:    1.Cholangiocarcinoma: PET scan results from January 26, 2019 reviewed independently with confluence hypermetabolic masses throughout the liver as well as porta hepatis and retroperitoneal lymphadenopathy.  Patient has a mildly's elevated CEA at 7.1, but the remainder of his tumor markers are either negative or within normal limits.  Case was discussed with pathology  and the most likely diagnosis is cholangiocarcinoma.  Patient wishes to pursue palliative chemotherapy.  Patient has now had port placed.  Proceed with cycle 1, day 15 of treatment today which is gemcitabine only.  Return to clinic in 2 weeks for further evaluation and consideration of cycle 2, day 1 of cisplatin and gemcitabine.  2.  Leukocytosis: Patient's white count is significantly elevated today.  He has no obvious symptoms of infection, therefore may be reactive.  Monitor. 3.  Anemia: Hemoglobin is decreased, but stable at 8.2.  He has a mild iron deficiency and may benefit from IV Feraheme in the future.   4.  Insomnia/anxiety: Improved.  Appreciate palliative care input. 5.  Cough: Continue Tessalon Perles as needed. 6.  Poor appetite: Improved.  Continue Megace as prescribed. 7.  Thrombocytosis: Likely reactive, monitor. 8.  Constipation: Patient states the only thing that works for him is colonoscopy prep, therefore was given a prescription for GoLYTELY.  Patient expressed understanding and was in agreement with this plan.  He also understands that He can call clinic at any time with any questions, concerns, or complaints.   Cancer Staging No matching staging information was found for the patient.  Lloyd Huger, MD   03/10/2019 1:05 PM

## 2019-03-09 ENCOUNTER — Telehealth: Payer: Self-pay

## 2019-03-09 NOTE — Telephone Encounter (Signed)
Nutrition  Referral received from [facility], NP for weight loss  Chart reviewed.   Called patient this pm.  No answer.  Left message with call back number.    Lamis Behrmann B. Zenia Resides, [place], [place] Registered Dietitian [phone] (pager)

## 2019-03-10 ENCOUNTER — Inpatient Hospital Stay: Payer: PPO

## 2019-03-10 ENCOUNTER — Inpatient Hospital Stay: Payer: PPO | Attending: Oncology

## 2019-03-10 ENCOUNTER — Encounter: Payer: Self-pay | Admitting: Oncology

## 2019-03-10 ENCOUNTER — Other Ambulatory Visit: Payer: Self-pay

## 2019-03-10 ENCOUNTER — Inpatient Hospital Stay (HOSPITAL_BASED_OUTPATIENT_CLINIC_OR_DEPARTMENT_OTHER): Payer: PPO | Admitting: Oncology

## 2019-03-10 ENCOUNTER — Inpatient Hospital Stay (HOSPITAL_BASED_OUTPATIENT_CLINIC_OR_DEPARTMENT_OTHER): Payer: PPO | Admitting: Hospice and Palliative Medicine

## 2019-03-10 VITALS — HR 99

## 2019-03-10 VITALS — BP 114/82 | HR 101 | Temp 98.0°F | Resp 24 | Ht 72.0 in | Wt 191.7 lb

## 2019-03-10 DIAGNOSIS — C221 Intrahepatic bile duct carcinoma: Secondary | ICD-10-CM

## 2019-03-10 DIAGNOSIS — Z95828 Presence of other vascular implants and grafts: Secondary | ICD-10-CM

## 2019-03-10 DIAGNOSIS — D509 Iron deficiency anemia, unspecified: Secondary | ICD-10-CM | POA: Diagnosis not present

## 2019-03-10 DIAGNOSIS — Z515 Encounter for palliative care: Secondary | ICD-10-CM

## 2019-03-10 DIAGNOSIS — Z5111 Encounter for antineoplastic chemotherapy: Secondary | ICD-10-CM | POA: Diagnosis not present

## 2019-03-10 LAB — CBC WITH DIFFERENTIAL/PLATELET
Abs Immature Granulocytes: 1.06 10*3/uL — ABNORMAL HIGH (ref 0.00–0.07)
Basophils Absolute: 0.2 10*3/uL — ABNORMAL HIGH (ref 0.0–0.1)
Basophils Relative: 1 %
Eosinophils Absolute: 0.7 10*3/uL — ABNORMAL HIGH (ref 0.0–0.5)
Eosinophils Relative: 2 %
HCT: 25.9 % — ABNORMAL LOW (ref 39.0–52.0)
Hemoglobin: 8.2 g/dL — ABNORMAL LOW (ref 13.0–17.0)
Immature Granulocytes: 4 %
Lymphocytes Relative: 4 %
Lymphs Abs: 1.2 10*3/uL (ref 0.7–4.0)
MCH: 24 pg — ABNORMAL LOW (ref 26.0–34.0)
MCHC: 31.7 g/dL (ref 30.0–36.0)
MCV: 76 fL — ABNORMAL LOW (ref 80.0–100.0)
Monocytes Absolute: 2.5 10*3/uL — ABNORMAL HIGH (ref 0.1–1.0)
Monocytes Relative: 9 %
Neutro Abs: 23.9 10*3/uL — ABNORMAL HIGH (ref 1.7–7.7)
Neutrophils Relative %: 80 %
Platelets: 1011 10*3/uL (ref 150–400)
RBC: 3.41 MIL/uL — ABNORMAL LOW (ref 4.22–5.81)
RDW: 20.9 % — ABNORMAL HIGH (ref 11.5–15.5)
WBC: 29.4 10*3/uL — ABNORMAL HIGH (ref 4.0–10.5)
nRBC: 0 % (ref 0.0–0.2)

## 2019-03-10 LAB — COMPREHENSIVE METABOLIC PANEL
ALT: 20 U/L (ref 0–44)
AST: 56 U/L — ABNORMAL HIGH (ref 15–41)
Albumin: 2.9 g/dL — ABNORMAL LOW (ref 3.5–5.0)
Alkaline Phosphatase: 535 U/L — ABNORMAL HIGH (ref 38–126)
Anion gap: 12 (ref 5–15)
BUN: 14 mg/dL (ref 8–23)
CO2: 20 mmol/L — ABNORMAL LOW (ref 22–32)
Calcium: 8.6 mg/dL — ABNORMAL LOW (ref 8.9–10.3)
Chloride: 100 mmol/L (ref 98–111)
Creatinine, Ser: 0.72 mg/dL (ref 0.61–1.24)
GFR calc Af Amer: >60 mL/min (ref >60)
GFR calc non Af Amer: >60 mL/min (ref >60)
Glucose, Bld: 126 mg/dL — ABNORMAL HIGH (ref 70–99)
Potassium: 4.3 mmol/L (ref 3.5–5.1)
Sodium: 132 mmol/L — ABNORMAL LOW (ref 135–145)
Total Bilirubin: 0.9 mg/dL (ref 0.3–1.2)
Total Protein: 7.3 g/dL (ref 6.5–8.1)

## 2019-03-10 MED ORDER — SODIUM CHLORIDE 0.9 % IV SOLN
Freq: Once | INTRAVENOUS | Status: AC
  Administered 2019-03-10: 12:00:00 via INTRAVENOUS
  Filled 2019-03-10: qty 250

## 2019-03-10 MED ORDER — HEPARIN SOD (PORK) LOCK FLUSH 100 UNIT/ML IV SOLN
500.0000 [IU] | Freq: Once | INTRAVENOUS | Status: AC
  Administered 2019-03-10: 500 [IU] via INTRAVENOUS
  Filled 2019-03-10: qty 5

## 2019-03-10 MED ORDER — SODIUM CHLORIDE 0.9% FLUSH
10.0000 mL | Freq: Once | INTRAVENOUS | Status: AC
  Administered 2019-03-10: 10:00:00 10 mL via INTRAVENOUS
  Filled 2019-03-10: qty 10

## 2019-03-10 MED ORDER — PROCHLORPERAZINE MALEATE 10 MG PO TABS
10.0000 mg | ORAL_TABLET | Freq: Once | ORAL | Status: AC
  Administered 2019-03-10: 10 mg via ORAL
  Filled 2019-03-10: qty 1

## 2019-03-10 MED ORDER — SODIUM CHLORIDE 0.9 % IV SOLN
2000.0000 mg | Freq: Once | INTRAVENOUS | Status: AC
  Administered 2019-03-10: 12:00:00 2000 mg via INTRAVENOUS
  Filled 2019-03-10: qty 52.6

## 2019-03-10 MED ORDER — GOLYTELY 227.1 G PO SOLR: 1.0000 | Freq: Once | ORAL | 0 refills | Status: AC

## 2019-03-10 NOTE — Progress Notes (Signed)
Here today for treatment. Reports pain level 9 related to constipation. When he can go he has to strain to the point it is unbearable and only gets a "thumb size" piece of stool out. Tried lactulose and reports it did not help any.

## 2019-03-11 NOTE — Progress Notes (Signed)
Entered in error. Patient not seen.

## 2019-03-12 ENCOUNTER — Telehealth: Payer: Self-pay

## 2019-03-12 NOTE — Telephone Encounter (Signed)
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  Past medical history of ischemic cardiomyopathy EF 30%, AICD, CAD, MI, HTN, HLD, MI, pacemaker.    Spoke with patient via phone this pm.  Patient reports that his constipated and miserable.  Has not been able to pick up Golytely prescription and [facility] is out and does not know when they will get it.  He does not really want to talk about nutrition at this time due to constipation.     Nutrition Focused Physical Exam: deferred   Medications: Vit D, slow Mag, MVI, omega 3, zofran, compazine, ,vit b 12   Labs: reviewed   Anthropometrics:   Height: 72 inches Weight: 191 lb 11.2 oz on 9/1 Noted 240 lb on 07/25/2018 BMI: 25  20% weight loss in the last 8 months, significant  Estimated Energy Needs  Kcals: 2600-3000 Protein: 130-150 g Fluid: 2.6 L   NUTRITION DIAGNOSIS: Inadequate oral intake related to altered GI function (constipation) and cancer diagnosis as evidenced by 20% weight loss in the last 8 months    INTERVENTION:  Message sent to provider and nurse regarding patient's request for prescription to be sent to pharmacy that has Golytely in stock.   Will plan to reach back out to patient in 2 weeks to discuss nutrition.     MONITORING, EVALUATION, GOAL: Patient will consume adequate calories and protein to maintain weight   Next Visit: phone f/u Sept 24  Miyonna Ormiston B. Zenia Resides, [place], [place] Registered Dietitian [phone] (pager)

## 2019-03-13 NOTE — Progress Notes (Signed)
[place]  [phone] Fax:(336) [phone]  ID: Jose Dunlap OB: 1950/10/22  MR#: [id number]  [id number]  Patient Care Team: Olin Hauser, DO as PCP - General (Family Medicine)  CHIEF COMPLAINT: Cholangiocarcinoma  INTERVAL HISTORY: Patient returns to clinic today for further evaluation and consideration of cycle 2, day 1 of cisplatin and gemcitabine.  His constipation has significantly improved.  He continues to have occasional right flank pain, but otherwise feels well.  He has chronic weakness and fatigue.  He has no neurologic complaints.  He denies any recent fevers or illnesses.  He has no chest pain, shortness of breath, or hemoptysis.  He continues to have chronic cough.  He denies any nausea, vomiting, constipation, or diarrhea.  He has no melena or hematochezia.  He has no urinary complaints.  Patient offers no further specific complaints today.  REVIEW OF SYSTEMS:   Review of Systems  Constitutional: Positive for malaise/fatigue. Negative for fever and weight loss.  Respiratory: Positive for cough. Negative for hemoptysis and shortness of breath.   Cardiovascular: Negative.  Negative for chest pain and leg swelling.  Gastrointestinal: Negative.  Negative for abdominal pain, blood in stool, constipation, diarrhea, melena, nausea and vomiting.  Genitourinary: Positive for flank pain. Negative for dysuria.  Musculoskeletal: Negative for back pain.  Skin: Negative.  Negative for rash.  Neurological: Positive for weakness. Negative for dizziness, focal weakness and headaches.  Psychiatric/Behavioral: Negative.  The patient is not nervous/anxious and does not have insomnia.     As per HPI. Otherwise, a complete review of systems is negative.  PAST MEDICAL HISTORY: Past Medical History:  Diagnosis Date  . Essential hypertension   . Hyperlipidemia   . Liver cancer ([place])   . MI (myocardial infarction) ([place])   . Obesity   .  Pacemaker    PACER/DEFIBRILLATOR [address], [place]  . Shortness of breath dyspnea     PAST SURGICAL HISTORY: Past Surgical History:  Procedure Laterality Date  . ABDOMINAL HERNIA REPAIR  2001  . BACK SURGERY  1986  . COLONOSCOPY WITH PROPOFOL N/A 05/19/2015   Procedure: COLONOSCOPY WITH PROPOFOL;  Surgeon: Hulen Luster, MD;  Location: [facility] ENDOSCOPY;  Service: Gastroenterology;  Laterality: N/A;  . CORONARY ANGIOPLASTY    . INSERT / REPLACE / REMOVE PACEMAKER    . IR IMAGING GUIDED PORT INSERTION  02/13/2019  . KNEE ARTHROSCOPY    . PACEMAKER PLACEMENT    . PROSTATE SURGERY    . TONSILLECTOMY      FAMILY HISTORY: History reviewed. No pertinent family history.  ADVANCED DIRECTIVES (Y/N):  N  HEALTH MAINTENANCE: Social History   Tobacco Use  . Smoking status: Former Research scientist (life sciences)  . Smokeless tobacco: Former Network engineer Use Topics  . Alcohol use: Not Currently  . Drug use: No     Colonoscopy:  PAP:  Bone density:  Lipid panel:  No Known Allergies  Current Outpatient Medications  Medication Sig Dispense Refill  . albuterol (VENTOLIN HFA) 108 (90 Base) MCG/ACT inhaler Inhale 2 puffs into the lungs every 4 (four) hours as needed for wheezing or shortness of breath (cough). 8 g 0  . ALPRAZolam (XANAX) 0.5 MG tablet Take 1 tablet (0.5 mg total) by mouth at bedtime as needed for anxiety. 30 tablet 1  . aspirin 81 MG tablet Chew 81 mg by mouth daily.    . benzonatate (TESSALON) 100 MG capsule Take 1 capsule (100 mg total) by mouth 3 (three) times  daily as needed for cough. 90 capsule 0  . Cholecalciferol (D 1000) 1000 units capsule Take 1 capsule by mouth daily.    . clopidogrel (PLAVIX) 75 MG tablet Take 1 tablet (75 mg total) by mouth daily. 90 tablet 1  . fluticasone (FLONASE) 50 MCG/ACT nasal spray Place 2 sprays into both nostrils daily. 16 g 6  . Icosapent Ethyl (VASCEPA) 1 g CAPS Take 2 g by mouth 2 (two) times daily. 120 capsule 5  . lactulose (CHRONULAC) 10  GM/15ML solution Take 15 mLs (10 g total) by mouth 3 (three) times daily as needed for mild constipation. 240 mL 1  . lidocaine-prilocaine (EMLA) cream Apply to affected area once 30 g 3  . Magnesium Cl-Calcium Carbonate (SLOW MAGNESIUM/CALCIUM) 70-117 MG TBEC Take 1 tablet by mouth daily.     . megestrol (MEGACE) 40 MG tablet Take 1 tablet (40 mg total) by mouth daily. 30 tablet 1  . Multiple Vitamin (MULTIVITAMIN) capsule Take 1 capsule by mouth daily.    . nebivolol (BYSTOLIC) 5 MG tablet Take 1 tablet (5 mg total) by mouth daily. 90 tablet 1  . nitroGLYCERIN (NITROSTAT) 0.4 MG SL tablet Place 1 tablet (0.4 mg total) under the tongue as needed. 25 tablet 4  . Omega-3 Fatty Acids (FISH OIL PO) Take 1 capsule by mouth 2 (two) times daily.     . ondansetron (ZOFRAN) 8 MG tablet Take 1 tablet (8 mg total) by mouth 2 (two) times daily as needed. 30 tablet 2  . prochlorperazine (COMPAZINE) 10 MG tablet Take 1 tablet (10 mg total) by mouth every 6 (six) hours as needed (Nausea or vomiting). 60 tablet 2  . traZODone (DESYREL) 50 MG tablet Take 0.5-1 tablets (25-50 mg total) by mouth at bedtime as needed for sleep. 30 tablet 1  . vitamin B-12 (CYANOCOBALAMIN) 250 MCG tablet Take 1 tablet by mouth daily.     No current facility-administered medications for this visit.     OBJECTIVE: Vitals:   03/25/19 0830  BP: 102/89  Pulse: 80  Temp: 97.8 F (36.6 C)     Body mass index is 25.09 kg/m.    ECOG FS:1 - Symptomatic but completely ambulatory  General: Thin, no acute distress. Eyes: Pink conjunctiva, anicteric sclera. HEENT: Normocephalic, moist mucous membranes. Lungs: Clear to auscultation bilaterally. Heart: Regular rate and rhythm. No rubs, murmurs, or gallops. Abdomen: Soft, nontender, nondistended. No organomegaly noted, normoactive bowel sounds. Musculoskeletal: No edema, cyanosis, or clubbing. Neuro: Alert, answering all questions appropriately. Cranial nerves grossly intact. Skin: No  rashes or petechiae noted. Psych: Normal affect.  LAB RESULTS:  Lab Results  Component Value Date   NA 133 (L) 03/25/2019   K 4.0 03/25/2019   CL 102 03/25/2019   CO2 19 (L) 03/25/2019   GLUCOSE 148 (H) 03/25/2019   BUN 12 03/25/2019   CREATININE 0.67 03/25/2019   CALCIUM 8.5 (L) 03/25/2019   PROT 6.9 03/25/2019   ALBUMIN 2.7 (L) 03/25/2019   AST 60 (H) 03/25/2019   ALT 33 03/25/2019   ALKPHOS 903 (H) 03/25/2019   BILITOT 1.1 03/25/2019   GFRNONAA >60 03/25/2019   GFRAA >60 03/25/2019    Lab Results  Component Value Date   WBC 37.8 (H) 03/25/2019   NEUTROABS 32.9 (H) 03/25/2019   HGB 7.9 (L) 03/25/2019   HCT 25.1 (L) 03/25/2019   MCV 78.7 (L) 03/25/2019   PLT 400 03/25/2019     STUDIES: No results found.  ASSESSMENT: Cholangiocarcinoma.  PLAN:  1.Cholangiocarcinoma: PET scan results from January 26, 2019 reviewed independently with confluence hypermetabolic masses throughout the liver as well as porta hepatis and retroperitoneal lymphadenopathy.  Patient has a mildly's elevated CEA at 7.1, but the remainder of his tumor markers are either negative or within normal limits.  Case was discussed with pathology and the most likely diagnosis is cholangiocarcinoma.  Patient wishes to pursue palliative chemotherapy.  Patient has now had port placed.  Proceed with cycle 2, day 1 of cisplatin and gemcitabine today.  Return to clinic in 1 week for further evaluation and consideration of cycle 2, day 8 which is gemcitabine only.  2.  Leukocytosis: Patient's white blood cell count remains significantly elevated.  He has no obvious symptoms of infection, therefore may be reactive.  Monitor. 3.  Anemia: Hemoglobin slightly trended down to 7.9, monitor.  He has a mild iron deficiency and may benefit from IV Feraheme in the future.   4.  Insomnia/anxiety: Improved.  Appreciate palliative care input. 5.  Cough: Chronic and unchanged.  Continue Tessalon Perles as needed. 6.  Poor  appetite: Improved.  Continue Megace as prescribed. 7.  Thrombocytosis: Improved.  Likely reactive, monitor. 8.  Constipation: Resolved.  Patient used GoLYTELY.  Patient expressed understanding and was in agreement with this plan. He also understands that He can call clinic at any time with any questions, concerns, or complaints.   Cancer Staging No matching staging information was found for the patient.  Lloyd Huger, MD   03/25/2019 4:40 PM

## 2019-03-23 DIAGNOSIS — R7989 Other specified abnormal findings of blood chemistry: Secondary | ICD-10-CM

## 2019-03-24 ENCOUNTER — Ambulatory Visit: Payer: PPO | Admitting: Oncology

## 2019-03-24 ENCOUNTER — Other Ambulatory Visit: Payer: PPO

## 2019-03-24 ENCOUNTER — Ambulatory Visit: Payer: PPO

## 2019-03-24 DIAGNOSIS — I259 Chronic ischemic heart disease, unspecified: Secondary | ICD-10-CM | POA: Diagnosis not present

## 2019-03-24 DIAGNOSIS — C221 Intrahepatic bile duct carcinoma: Secondary | ICD-10-CM | POA: Diagnosis not present

## 2019-03-24 DIAGNOSIS — Z95810 Presence of automatic (implantable) cardiac defibrillator: Secondary | ICD-10-CM | POA: Diagnosis not present

## 2019-03-24 DIAGNOSIS — I472 Ventricular tachycardia: Secondary | ICD-10-CM | POA: Diagnosis not present

## 2019-03-24 DIAGNOSIS — I10 Essential (primary) hypertension: Secondary | ICD-10-CM | POA: Diagnosis not present

## 2019-03-24 DIAGNOSIS — E7801 Familial hypercholesterolemia: Secondary | ICD-10-CM | POA: Diagnosis not present

## 2019-03-24 DIAGNOSIS — I517 Cardiomegaly: Secondary | ICD-10-CM | POA: Diagnosis not present

## 2019-03-24 DIAGNOSIS — I255 Ischemic cardiomyopathy: Secondary | ICD-10-CM | POA: Diagnosis not present

## 2019-03-25 ENCOUNTER — Inpatient Hospital Stay: Payer: PPO

## 2019-03-25 ENCOUNTER — Inpatient Hospital Stay: Payer: PPO | Admitting: *Deleted

## 2019-03-25 ENCOUNTER — Other Ambulatory Visit: Payer: Self-pay

## 2019-03-25 ENCOUNTER — Encounter: Payer: Self-pay | Admitting: Oncology

## 2019-03-25 ENCOUNTER — Inpatient Hospital Stay (HOSPITAL_BASED_OUTPATIENT_CLINIC_OR_DEPARTMENT_OTHER): Payer: PPO | Admitting: Hospice and Palliative Medicine

## 2019-03-25 ENCOUNTER — Inpatient Hospital Stay (HOSPITAL_BASED_OUTPATIENT_CLINIC_OR_DEPARTMENT_OTHER): Payer: PPO | Admitting: Oncology

## 2019-03-25 VITALS — BP 102/89 | HR 80 | Temp 97.8°F | Ht 72.0 in | Wt 185.0 lb

## 2019-03-25 DIAGNOSIS — C221 Intrahepatic bile duct carcinoma: Secondary | ICD-10-CM

## 2019-03-25 DIAGNOSIS — Z515 Encounter for palliative care: Secondary | ICD-10-CM

## 2019-03-25 DIAGNOSIS — Z5111 Encounter for antineoplastic chemotherapy: Secondary | ICD-10-CM | POA: Diagnosis not present

## 2019-03-25 DIAGNOSIS — Z7189 Other specified counseling: Secondary | ICD-10-CM | POA: Diagnosis not present

## 2019-03-25 DIAGNOSIS — Z95828 Presence of other vascular implants and grafts: Secondary | ICD-10-CM

## 2019-03-25 LAB — COMPREHENSIVE METABOLIC PANEL
ALT: 33 U/L (ref 0–44)
AST: 60 U/L — ABNORMAL HIGH (ref 15–41)
Albumin: 2.7 g/dL — ABNORMAL LOW (ref 3.5–5.0)
Alkaline Phosphatase: 903 U/L — ABNORMAL HIGH (ref 38–126)
Anion gap: 12 (ref 5–15)
BUN: 12 mg/dL (ref 8–23)
CO2: 19 mmol/L — ABNORMAL LOW (ref 22–32)
Calcium: 8.5 mg/dL — ABNORMAL LOW (ref 8.9–10.3)
Chloride: 102 mmol/L (ref 98–111)
Creatinine, Ser: 0.67 mg/dL (ref 0.61–1.24)
GFR calc Af Amer: >60 mL/min (ref >60)
GFR calc non Af Amer: >60 mL/min (ref >60)
Glucose, Bld: 148 mg/dL — ABNORMAL HIGH (ref 70–99)
Potassium: 4 mmol/L (ref 3.5–5.1)
Sodium: 133 mmol/L — ABNORMAL LOW (ref 135–145)
Total Bilirubin: 1.1 mg/dL (ref 0.3–1.2)
Total Protein: 6.9 g/dL (ref 6.5–8.1)

## 2019-03-25 LAB — CBC WITH DIFFERENTIAL/PLATELET
Abs Immature Granulocytes: 0.96 10*3/uL — ABNORMAL HIGH (ref 0.00–0.07)
Basophils Absolute: 0.2 10*3/uL — ABNORMAL HIGH (ref 0.0–0.1)
Basophils Relative: 1 %
Eosinophils Absolute: 1 10*3/uL — ABNORMAL HIGH (ref 0.0–0.5)
Eosinophils Relative: 3 %
HCT: 25.1 % — ABNORMAL LOW (ref 39.0–52.0)
Hemoglobin: 7.9 g/dL — ABNORMAL LOW (ref 13.0–17.0)
Immature Granulocytes: 3 %
Lymphocytes Relative: 3 %
Lymphs Abs: 1 10*3/uL (ref 0.7–4.0)
MCH: 24.8 pg — ABNORMAL LOW (ref 26.0–34.0)
MCHC: 31.5 g/dL (ref 30.0–36.0)
MCV: 78.7 fL — ABNORMAL LOW (ref 80.0–100.0)
Monocytes Absolute: 1.8 10*3/uL — ABNORMAL HIGH (ref 0.1–1.0)
Monocytes Relative: 5 %
Neutro Abs: 32.9 10*3/uL — ABNORMAL HIGH (ref 1.7–7.7)
Neutrophils Relative %: 85 %
Platelets: 400 10*3/uL (ref 150–400)
RBC: 3.19 MIL/uL — ABNORMAL LOW (ref 4.22–5.81)
RDW: 22.5 % — ABNORMAL HIGH (ref 11.5–15.5)
WBC: 37.8 10*3/uL — ABNORMAL HIGH (ref 4.0–10.5)
nRBC: 0 % (ref 0.0–0.2)

## 2019-03-25 MED ORDER — HEPARIN SOD (PORK) LOCK FLUSH 100 UNIT/ML IV SOLN
500.0000 [IU] | Freq: Once | INTRAVENOUS | Status: AC | PRN
  Administered 2019-03-25: 500 [IU]
  Filled 2019-03-25: qty 5

## 2019-03-25 MED ORDER — PALONOSETRON HCL INJECTION 0.25 MG/5ML
0.2500 mg | Freq: Once | INTRAVENOUS | Status: AC
  Administered 2019-03-25: 0.25 mg via INTRAVENOUS
  Filled 2019-03-25: qty 5

## 2019-03-25 MED ORDER — SODIUM CHLORIDE 0.9% FLUSH
10.0000 mL | Freq: Once | INTRAVENOUS | Status: AC
  Administered 2019-03-25: 08:00:00 10 mL via INTRAVENOUS
  Filled 2019-03-25: qty 10

## 2019-03-25 MED ORDER — SODIUM CHLORIDE 0.9 % IV SOLN
70.0000 mg/m2 | Freq: Once | INTRAVENOUS | Status: AC
  Administered 2019-03-25: 153 mg via INTRAVENOUS
  Filled 2019-03-25: qty 153

## 2019-03-25 MED ORDER — SODIUM CHLORIDE 0.9 % IV SOLN
2000.0000 mg | Freq: Once | INTRAVENOUS | Status: AC
  Administered 2019-03-25: 2000 mg via INTRAVENOUS
  Filled 2019-03-25: qty 52.6

## 2019-03-25 MED ORDER — SODIUM CHLORIDE 0.9 % IV SOLN
Freq: Once | INTRAVENOUS | Status: AC
  Administered 2019-03-25: 10:00:00 via INTRAVENOUS
  Filled 2019-03-25: qty 250

## 2019-03-25 MED ORDER — SODIUM CHLORIDE 0.9 % IV SOLN
Freq: Once | INTRAVENOUS | Status: AC
  Administered 2019-03-25: 12:00:00 via INTRAVENOUS
  Filled 2019-03-25: qty 5

## 2019-03-25 MED ORDER — POTASSIUM CHLORIDE 2 MEQ/ML IV SOLN
Freq: Once | INTRAVENOUS | Status: AC
  Administered 2019-03-25: 10:00:00 via INTRAVENOUS
  Filled 2019-03-25: qty 1000

## 2019-03-25 NOTE — Progress Notes (Signed)
[place]  [phone] [phone]   Name: Jose Dunlap Date: 03/25/2019 MRN: [id number]  DOB: 10/01/1950  Patient Care Team: Olin Hauser, DO as PCP - General (Family Medicine)    REASON FOR CONSULTATION: Palliative Care consult requested for this 67 y.o. 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  Patient was hospitalized 01/12/2019-01/14/2019 with weight loss, weakness and shortness of breath and on CT imaging was found to have innumerable liver masses and retroperitoneal lymphadenopathy concerning for metastatic disease.  Pathology consistent with cholangiocarcinoma.  He was started on regimen of gemcitabine and cisplatin.  He was referred to palliative care to help address goals and manage ongoing symptoms.  SOCIAL HISTORY:     reports that he has quit smoking. He has quit using smokeless tobacco. He reports previous alcohol use. He reports that he does not use drugs.   Patient is married and lives at home with his wife.  ADVANCE DIRECTIVES:    CODE STATUS:   PAST MEDICAL HISTORY: Past Medical History:  Diagnosis Date  . Essential hypertension   . Hyperlipidemia   . Liver cancer ([place])   . MI (myocardial infarction) ([place])   . Obesity   . Pacemaker    PACER/DEFIBRILLATOR [address] [place]  . Shortness of breath dyspnea     PAST SURGICAL HISTORY:  Past Surgical History:  Procedure Laterality Date  . ABDOMINAL HERNIA REPAIR  2001  . BACK SURGERY  1986  . COLONOSCOPY WITH PROPOFOL N/A 05/19/2015   Procedure: COLONOSCOPY WITH PROPOFOL;  Surgeon: Hulen Luster, MD;  Location: [facility] ENDOSCOPY;  Service: Gastroenterology;  Laterality: N/A;  . CORONARY ANGIOPLASTY    . INSERT / REPLACE / REMOVE PACEMAKER    . IR IMAGING GUIDED PORT INSERTION   02/13/2019  . KNEE ARTHROSCOPY    . PACEMAKER PLACEMENT    . PROSTATE SURGERY    . TONSILLECTOMY      HEMATOLOGY/ONCOLOGY HISTORY:  Oncology History  Cholangiocarcinoma of liver ([place])  02/08/2019 Initial Diagnosis   Cholangiocarcinoma of liver ([place])   02/17/2019 -  Chemotherapy   The patient had palonosetron (ALOXI) injection 0.25 mg, 0.25 mg, Intravenous,  Once, 2 of 4 cycles Administration: 0.25 mg (02/17/2019) CISplatin (PLATINOL) 153 mg in sodium chloride 0.9 % 500 mL chemo infusion, 70 mg/m2 = 153 mg, Intravenous,  Once, 2 of 4 cycles Administration: 153 mg (02/17/2019) gemcitabine (GEMZAR) 2,200 mg in sodium chloride 0.9 % 250 mL chemo infusion, 2,204 mg, Intravenous,  Once, 2 of 4 cycles Administration: 2,200 mg (02/17/2019), 2,200 mg (02/24/2019), 2,000 mg (03/10/2019) fosaprepitant (EMEND) 150 mg, dexamethasone (DECADRON) 12 mg in sodium chloride 0.9 % 145 mL IVPB, , Intravenous,  Once, 2 of 4 cycles Administration:  (02/17/2019)  for chemotherapy treatment.      ALLERGIES:  has No Known Allergies.  MEDICATIONS:  Current Outpatient Medications  Medication Sig Dispense Refill  . albuterol (VENTOLIN HFA) 108 (90 Base) MCG/ACT inhaler Inhale 2 puffs into the lungs every 4 (four) hours as needed for wheezing or shortness of breath (cough). 8 g 0  . ALPRAZolam (XANAX) 0.5 MG tablet Take 1 tablet (0.5 mg total) by mouth at bedtime as needed for anxiety. 30 tablet 1  . aspirin 81 MG tablet Chew 81 mg by mouth daily.    . benzonatate (TESSALON) 100 MG  capsule Take 1 capsule (100 mg total) by mouth 3 (three) times daily as needed for cough. 90 capsule 0  . Cholecalciferol (D 1000) 1000 units capsule Take 1 capsule by mouth daily.    . clopidogrel (PLAVIX) 75 MG tablet Take 1 tablet (75 mg total) by mouth daily. 90 tablet 1  . fluticasone (FLONASE) 50 MCG/ACT nasal spray Place 2 sprays into both nostrils daily. 16 g 6  . Icosapent Ethyl (VASCEPA) 1 g CAPS Take 2 g by mouth 2 (two) times  daily. 120 capsule 5  . lactulose (CHRONULAC) 10 GM/15ML solution Take 15 mLs (10 g total) by mouth 3 (three) times daily as needed for mild constipation. 240 mL 1  . lidocaine-prilocaine (EMLA) cream Apply to affected area once 30 g 3  . Magnesium Cl-Calcium Carbonate (SLOW MAGNESIUM/CALCIUM) 70-117 MG TBEC Take 1 tablet by mouth daily.     . megestrol (MEGACE) 40 MG tablet Take 1 tablet (40 mg total) by mouth daily. 30 tablet 1  . Multiple Vitamin (MULTIVITAMIN) capsule Take 1 capsule by mouth daily.    . nebivolol (BYSTOLIC) 5 MG tablet Take 1 tablet (5 mg total) by mouth daily. 90 tablet 1  . nitroGLYCERIN (NITROSTAT) 0.4 MG SL tablet Place 1 tablet (0.4 mg total) under the tongue as needed. 25 tablet 4  . Omega-3 Fatty Acids (FISH OIL PO) Take 1 capsule by mouth 2 (two) times daily.     . ondansetron (ZOFRAN) 8 MG tablet Take 1 tablet (8 mg total) by mouth 2 (two) times daily as needed. 30 tablet 2  . prochlorperazine (COMPAZINE) 10 MG tablet Take 1 tablet (10 mg total) by mouth every 6 (six) hours as needed (Nausea or vomiting). 60 tablet 2  . traZODone (DESYREL) 50 MG tablet Take 0.5-1 tablets (25-50 mg total) by mouth at bedtime as needed for sleep. 30 tablet 1  . vitamin B-12 (CYANOCOBALAMIN) 250 MCG tablet Take 1 tablet by mouth daily.     No current facility-administered medications for this visit.    Facility-Administered Medications Ordered in Other Visits  Medication Dose Route Frequency Provider Last Rate Last Dose  . CISplatin (PLATINOL) 153 mg in sodium chloride 0.9 % 500 mL chemo infusion  70 mg/m2 (Treatment Plan Recorded) Intravenous Once Lloyd Huger, MD      . fosaprepitant (EMEND) 150 mg, dexamethasone (DECADRON) 12 mg in sodium chloride 0.9 % 145 mL IVPB   Intravenous Once Lloyd Huger, MD      . gemcitabine (GEMZAR) 2,000 mg in sodium chloride 0.9 % 250 mL chemo infusion  2,000 mg Intravenous Once Lloyd Huger, MD      . heparin lock flush 100  unit/mL  500 Units Intracatheter Once PRN Lloyd Huger, MD      . palonosetron (ALOXI) injection 0.25 mg  0.25 mg Intravenous Once Lloyd Huger, MD        VITAL SIGNS: There were no vitals taken for this visit. There were no vitals filed for this visit.  Estimated body mass index is 25.09 kg/m as calculated from the following:   Height as of an earlier encounter on 03/25/19: 6' (1.829 m).   Weight as of an earlier encounter on 03/25/19: 185 lb (83.9 kg).  LABS: CBC:    Component Value Date/Time   WBC 37.8 (H) 03/25/2019 0814   HGB 7.9 (L) 03/25/2019 0814   HGB 15.6 04/20/2015 0816   HCT 25.1 (L) 03/25/2019 0814   HCT 44.4 04/20/2015 0816  PLT 400 03/25/2019 0814   PLT 215 04/20/2015 0816   MCV 78.7 (L) 03/25/2019 0814   MCV 85 04/20/2015 0816   NEUTROABS 32.9 (H) 03/25/2019 0814   NEUTROABS 6.1 04/20/2015 0816   LYMPHSABS 1.0 03/25/2019 0814   LYMPHSABS 1.3 04/20/2015 0816   MONOABS 1.8 (H) 03/25/2019 0814   EOSABS 1.0 (H) 03/25/2019 0814   EOSABS 0.2 04/20/2015 0816   BASOSABS 0.2 (H) 03/25/2019 0814   BASOSABS 0.0 04/20/2015 0816   Comprehensive Metabolic Panel:    Component Value Date/Time   NA 133 (L) 03/25/2019 0814   NA 138 04/20/2015 0816   K 4.0 03/25/2019 0814   CL 102 03/25/2019 0814   CO2 19 (L) 03/25/2019 0814   BUN 12 03/25/2019 0814   BUN 12 04/20/2015 0816   CREATININE 0.67 03/25/2019 0814   CREATININE 0.83 08/06/2017 0811   GLUCOSE 148 (H) 03/25/2019 0814   CALCIUM 8.5 (L) 03/25/2019 0814   AST 60 (H) 03/25/2019 0814   ALT 33 03/25/2019 0814   ALKPHOS 903 (H) 03/25/2019 0814   BILITOT 1.1 03/25/2019 0814   BILITOT 0.9 04/20/2015 0816   PROT 6.9 03/25/2019 0814   PROT 6.6 04/20/2015 0816   ALBUMIN 2.7 (L) 03/25/2019 0814   ALBUMIN 4.2 04/20/2015 0816    RADIOGRAPHIC STUDIES: No results found.  PERFORMANCE STATUS (ECOG) : 1 - Symptomatic but completely ambulatory  Review of Systems Unless otherwise noted, a complete review  of systems is negative.  Physical Exam General: NAD, frail appearing, thin Pulmonary: Unlabored Extremities: no edema, no joint deformities Skin: no rashes Neurological: Weakness but otherwise nonfocal  IMPRESSION: Routine follow-up visit today.  Patient denies any acute changes or concerns.  Patient reports improved appetite.  Weight is down 9 pounds over the past month.  Patient says he stopped taking the Megace as he feels the appetite improved.  Will follow weight trends.  Encourage oral supplements.  He is being followed by our dietitian.    He reports sleeping better with the trazodone taken with an alprazolam.  He tried 2 of the trazodone but felt spacey afterwards so has only been taking 1 at bedtime.  I reviewed today a MOST Form with patient, which he took home to discuss with his family.   PLAN: -Continue current scope of treatment -MOST Form reviewed -RTC in 1 to 2 weeks   Patient expressed understanding and was in agreement with this plan. He also understands that He can call the clinic at any time with any questions, concerns, or complaints.     Time Total: 15 minutes  Visit consisted of counseling and education dealing with the complex and emotionally intense issues of symptom management and palliative care in the setting of serious and potentially life-threatening illness.Greater than 50%  of this time was spent counseling and coordinating care related to the above assessment and plan.  Signed by: Altha Harm, PhD, NP-C [phone] (Work Cell)

## 2019-03-25 NOTE — Progress Notes (Signed)
Patient stated that for the past 3 days he has had abdominal pain. Patient denied nausea, vomiting, diarrhea or constipation.

## 2019-03-25 NOTE — Progress Notes (Signed)
RN viewed labs with MD, orders to continue with treatment today with hgb 7.9. pt updated and all questions answered at this time.   Shoshanah Dapper [employer]

## 2019-03-26 NOTE — Progress Notes (Signed)
[place]  [phone] [phone]) [phone]  ID: Hipolito Alltop OB: [birth year]  MR#: [id number]  [id number]  Patient Care Team: Olin Hauser, DO as PCP - General (Family Medicine)  CHIEF COMPLAINT: Cholangiocarcinoma  INTERVAL HISTORY: Patient returns to clinic today for further evaluation and consideration of cycle 2, day 8 of cisplatin and gemcitabine.  Gemcitabine only.  Patient had increased nausea and poor appetite for 3 to 4 days after his last treatment, but states this is improving.  He has mild constipation today.  He continues to have occasional right flank pain, but otherwise feels well.  He has chronic weakness and fatigue.  He has no neurologic complaints.  He denies any recent fevers or illnesses.  He has no chest pain, shortness of breath, or hemoptysis.  He continues to have chronic cough.  He denies any nausea, vomiting, or diarrhea.  He has no melena or hematochezia.  He has no urinary complaints.  Patient offers no further specific complaints today.  REVIEW OF SYSTEMS:   Review of Systems  Constitutional: Positive for malaise/fatigue. Negative for fever and weight loss.  Respiratory: Positive for cough. Negative for hemoptysis and shortness of breath.   Cardiovascular: Negative.  Negative for chest pain and leg swelling.  Gastrointestinal: Negative.  Negative for abdominal pain, blood in stool, constipation, diarrhea, melena, nausea and vomiting.  Genitourinary: Positive for flank pain. Negative for dysuria.  Musculoskeletal: Negative for back pain.  Skin: Negative.  Negative for rash.  Neurological: Positive for weakness. Negative for dizziness, focal weakness and headaches.  Psychiatric/Behavioral: Negative.  The patient is not nervous/anxious and does not have insomnia.     As per HPI. Otherwise, a complete review of systems is negative.  PAST MEDICAL HISTORY: Past Medical History:  Diagnosis Date  . Essential  hypertension   . Hyperlipidemia   . Liver cancer ([place])   . MI (myocardial infarction) ([place])   . Obesity   . Pacemaker    PACER/DEFIBRILLATOR [address] [place]  . Shortness of breath dyspnea     PAST SURGICAL HISTORY: Past Surgical History:  Procedure Laterality Date  . ABDOMINAL HERNIA REPAIR  2001  . BACK SURGERY  1986  . COLONOSCOPY WITH PROPOFOL N/A 05/19/2015   Procedure: COLONOSCOPY WITH PROPOFOL;  Surgeon: Hulen Luster, MD;  Location: [facility] ENDOSCOPY;  Service: Gastroenterology;  Laterality: N/A;  . CORONARY ANGIOPLASTY    . INSERT / REPLACE / REMOVE PACEMAKER    . IR IMAGING GUIDED PORT INSERTION  02/13/2019  . KNEE ARTHROSCOPY    . PACEMAKER PLACEMENT    . PROSTATE SURGERY    . TONSILLECTOMY      FAMILY HISTORY: History reviewed. No pertinent family history.  ADVANCED DIRECTIVES (Y/N):  N  HEALTH MAINTENANCE: Social History   Tobacco Use  . Smoking status: Former Research scientist (life sciences)  . Smokeless tobacco: Former Network engineer Use Topics  . Alcohol use: Not Currently  . Drug use: No     Colonoscopy:  PAP:  Bone density:  Lipid panel:  No Known Allergies  Current Outpatient Medications  Medication Sig Dispense Refill  . albuterol (VENTOLIN HFA) 108 (90 Base) MCG/ACT inhaler Inhale 2 puffs into the lungs every 4 (four) hours as needed for wheezing or shortness of breath (cough). 8 g 0  . ALPRAZolam (XANAX) 0.5 MG tablet Take 1 tablet (0.5 mg total) by mouth at bedtime as needed for anxiety. 30 tablet 1  . aspirin 81 MG tablet Chew 81  mg by mouth daily.    . benzonatate (TESSALON) 100 MG capsule Take 1 capsule (100 mg total) by mouth 3 (three) times daily as needed for cough. 90 capsule 0  . Cholecalciferol (D 1000) 1000 units capsule Take 1 capsule by mouth daily.    . clopidogrel (PLAVIX) 75 MG tablet Take 1 tablet (75 mg total) by mouth daily. 90 tablet 1  . fluticasone (FLONASE) 50 MCG/ACT nasal spray Place 2 sprays into both nostrils daily. 16 g 6  .  Icosapent Ethyl (VASCEPA) 1 g CAPS Take 2 g by mouth 2 (two) times daily. 120 capsule 5  . lactulose (CHRONULAC) 10 GM/15ML solution Take 15 mLs (10 g total) by mouth 3 (three) times daily as needed for mild constipation. 240 mL 1  . lidocaine-prilocaine (EMLA) cream Apply to affected area once 30 g 3  . Magnesium Cl-Calcium Carbonate (SLOW MAGNESIUM/CALCIUM) 70-117 MG TBEC Take 1 tablet by mouth daily.     . megestrol (MEGACE) 40 MG tablet Take 1 tablet (40 mg total) by mouth daily. 30 tablet 1  . Multiple Vitamin (MULTIVITAMIN) capsule Take 1 capsule by mouth daily.    . nebivolol (BYSTOLIC) 5 MG tablet Take 1 tablet (5 mg total) by mouth daily. 90 tablet 1  . nitroGLYCERIN (NITROSTAT) 0.4 MG SL tablet Place 1 tablet (0.4 mg total) under the tongue as needed. 25 tablet 4  . Omega-3 Fatty Acids (FISH OIL PO) Take 1 capsule by mouth 2 (two) times daily.     . ondansetron (ZOFRAN) 8 MG tablet Take 1 tablet (8 mg total) by mouth 2 (two) times daily as needed. 30 tablet 2  . prochlorperazine (COMPAZINE) 10 MG tablet Take 1 tablet (10 mg total) by mouth every 6 (six) hours as needed (Nausea or vomiting). 60 tablet 2  . traZODone (DESYREL) 50 MG tablet Take 0.5-1 tablets (25-50 mg total) by mouth at bedtime as needed for sleep. 30 tablet 1  . vitamin B-12 (CYANOCOBALAMIN) 250 MCG tablet Take 1 tablet by mouth daily.     No current facility-administered medications for this visit.     OBJECTIVE: Vitals:   04/01/19 0902  BP: 120/73  Pulse: 77  Resp: 16  Temp: (!) 97.1 F (36.2 C)  SpO2: 100%     Body mass index is 24.47 kg/m.    ECOG FS:1 - Symptomatic but completely ambulatory  General: Thin, no acute distress. Eyes: Pink conjunctiva, anicteric sclera. HEENT: Normocephalic, moist mucous membranes. Lungs: Clear to auscultation bilaterally. Heart: Regular rate and rhythm. No rubs, murmurs, or gallops. Abdomen: Soft, nontender, nondistended. No organomegaly noted, normoactive bowel sounds.  Musculoskeletal: No edema, cyanosis, or clubbing. Neuro: Alert, answering all questions appropriately. Cranial nerves grossly intact. Skin: No rashes or petechiae noted. Psych: Normal affect.  LAB RESULTS:  Lab Results  Component Value Date   NA 132 (L) 04/01/2019   K 4.0 04/01/2019   CL 99 04/01/2019   CO2 22 04/01/2019   GLUCOSE 92 04/01/2019   BUN 12 04/01/2019   CREATININE 0.69 04/01/2019   CALCIUM 8.9 04/01/2019   PROT 7.1 04/01/2019   ALBUMIN 2.9 (L) 04/01/2019   AST 30 04/01/2019   ALT 27 04/01/2019   ALKPHOS 702 (H) 04/01/2019   BILITOT 0.8 04/01/2019   GFRNONAA >60 04/01/2019   GFRAA >60 04/01/2019    Lab Results  Component Value Date   WBC 20.2 (H) 04/01/2019   NEUTROABS 17.3 (H) 04/01/2019   HGB 7.5 (L) 04/01/2019   HCT 23.7 (L)  04/01/2019   MCV 79.8 (L) 04/01/2019   PLT 193 04/01/2019     STUDIES: No results found.  ASSESSMENT: Cholangiocarcinoma.  PLAN:    1.Cholangiocarcinoma: PET scan results from January 26, 2019 reviewed independently with confluence hypermetabolic masses throughout the liver as well as porta hepatis and retroperitoneal lymphadenopathy.  Patient has a mildly's elevated CEA at 7.1, but the remainder of his tumor markers are either negative or within normal limits.  Case was discussed with pathology and the most likely diagnosis is cholangiocarcinoma.  Patient wishes to pursue palliative chemotherapy.  Patient has now had port placed.  Proceed with cycle 2, day 8 of cisplatin and gemcitabine today.  Gemcitabine only.  Return to clinic in 1 week for further evaluation and consideration of cycle 2, day 15.   2.  Leukocytosis: Patient's white blood cell count remains elevated, but is trending down.  He has no obvious symptoms of infection, therefore may be reactive.  Monitor. 3.  Anemia: Hemoglobin continues to trend down is now 7.5.  He has a mild iron deficiency and may benefit from IV Feraheme in the future.  Will transfuse if his  hemoglobin falls below 7.0. 4.  Insomnia/anxiety: Improved.  Appreciate palliative care input. 5.  Cough: Chronic and unchanged.  Continue Tessalon Perles as needed. 6.  Poor appetite: Improved.  Continue Megace as prescribed. 7.  Thrombocytosis: Resolved. 8.  Constipation: Mild.  Patient states the only thing that works for him is GoLYTELY and plan is to take some when he goes home later today.  Patient expressed understanding and was in agreement with this plan. He also understands that He can call clinic at any time with any questions, concerns, or complaints.   Cancer Staging No matching staging information was found for the patient.  Lloyd Huger, MD   04/02/2019 6:17 AM

## 2019-04-01 ENCOUNTER — Inpatient Hospital Stay: Payer: PPO

## 2019-04-01 ENCOUNTER — Encounter: Payer: Self-pay | Admitting: Oncology

## 2019-04-01 ENCOUNTER — Inpatient Hospital Stay (HOSPITAL_BASED_OUTPATIENT_CLINIC_OR_DEPARTMENT_OTHER): Payer: PPO | Admitting: Oncology

## 2019-04-01 ENCOUNTER — Inpatient Hospital Stay: Payer: PPO | Admitting: *Deleted

## 2019-04-01 ENCOUNTER — Other Ambulatory Visit: Payer: Self-pay

## 2019-04-01 VITALS — BP 120/73 | HR 77 | Temp 97.1°F | Resp 16 | Wt 180.4 lb

## 2019-04-01 DIAGNOSIS — Z95828 Presence of other vascular implants and grafts: Secondary | ICD-10-CM

## 2019-04-01 DIAGNOSIS — C221 Intrahepatic bile duct carcinoma: Secondary | ICD-10-CM

## 2019-04-01 DIAGNOSIS — Z5111 Encounter for antineoplastic chemotherapy: Secondary | ICD-10-CM | POA: Diagnosis not present

## 2019-04-01 LAB — COMPREHENSIVE METABOLIC PANEL
ALT: 27 U/L (ref 0–44)
AST: 30 U/L (ref 15–41)
Albumin: 2.9 g/dL — ABNORMAL LOW (ref 3.5–5.0)
Alkaline Phosphatase: 702 U/L — ABNORMAL HIGH (ref 38–126)
Anion gap: 11 (ref 5–15)
BUN: 12 mg/dL (ref 8–23)
CO2: 22 mmol/L (ref 22–32)
Calcium: 8.9 mg/dL (ref 8.9–10.3)
Chloride: 99 mmol/L (ref 98–111)
Creatinine, Ser: 0.69 mg/dL (ref 0.61–1.24)
GFR calc Af Amer: >60 mL/min (ref >60)
GFR calc non Af Amer: >60 mL/min (ref >60)
Glucose, Bld: 92 mg/dL (ref 70–99)
Potassium: 4 mmol/L (ref 3.5–5.1)
Sodium: 132 mmol/L — ABNORMAL LOW (ref 135–145)
Total Bilirubin: 0.8 mg/dL (ref 0.3–1.2)
Total Protein: 7.1 g/dL (ref 6.5–8.1)

## 2019-04-01 LAB — CBC WITH DIFFERENTIAL/PLATELET
Abs Immature Granulocytes: 0.2 10*3/uL — ABNORMAL HIGH (ref 0.00–0.07)
Basophils Absolute: 0.1 10*3/uL (ref 0.0–0.1)
Basophils Relative: 1 %
Eosinophils Absolute: 0.3 10*3/uL (ref 0.0–0.5)
Eosinophils Relative: 2 %
HCT: 23.7 % — ABNORMAL LOW (ref 39.0–52.0)
Hemoglobin: 7.5 g/dL — ABNORMAL LOW (ref 13.0–17.0)
Immature Granulocytes: 1 %
Lymphocytes Relative: 6 %
Lymphs Abs: 1.2 10*3/uL (ref 0.7–4.0)
MCH: 25.3 pg — ABNORMAL LOW (ref 26.0–34.0)
MCHC: 31.6 g/dL (ref 30.0–36.0)
MCV: 79.8 fL — ABNORMAL LOW (ref 80.0–100.0)
Monocytes Absolute: 1.1 10*3/uL — ABNORMAL HIGH (ref 0.1–1.0)
Monocytes Relative: 5 %
Neutro Abs: 17.3 10*3/uL — ABNORMAL HIGH (ref 1.7–7.7)
Neutrophils Relative %: 85 %
Platelets: 193 10*3/uL (ref 150–400)
RBC: 2.97 MIL/uL — ABNORMAL LOW (ref 4.22–5.81)
RDW: 21.9 % — ABNORMAL HIGH (ref 11.5–15.5)
WBC: 20.2 10*3/uL — ABNORMAL HIGH (ref 4.0–10.5)
nRBC: 0 % (ref 0.0–0.2)

## 2019-04-01 MED ORDER — SODIUM CHLORIDE 0.9% FLUSH
10.0000 mL | Freq: Once | INTRAVENOUS | Status: AC
  Administered 2019-04-01: 10 mL via INTRAVENOUS
  Filled 2019-04-01: qty 10

## 2019-04-01 MED ORDER — SODIUM CHLORIDE 0.9 % IV SOLN
Freq: Once | INTRAVENOUS | Status: AC
  Administered 2019-04-01: 10:00:00 via INTRAVENOUS
  Filled 2019-04-01: qty 250

## 2019-04-01 MED ORDER — PROCHLORPERAZINE MALEATE 10 MG PO TABS
10.0000 mg | ORAL_TABLET | Freq: Once | ORAL | Status: AC
  Administered 2019-04-01: 10:00:00 10 mg via ORAL
  Filled 2019-04-01: qty 1

## 2019-04-01 MED ORDER — HEPARIN SOD (PORK) LOCK FLUSH 100 UNIT/ML IV SOLN
500.0000 [IU] | Freq: Once | INTRAVENOUS | Status: AC | PRN
  Administered 2019-04-01: 500 [IU]
  Filled 2019-04-01: qty 5

## 2019-04-01 MED ORDER — SODIUM CHLORIDE 0.9 % IV SOLN
2000.0000 mg | Freq: Once | INTRAVENOUS | Status: AC
  Administered 2019-04-01: 11:00:00 2000 mg via INTRAVENOUS
  Filled 2019-04-01: qty 52.6

## 2019-04-01 NOTE — Progress Notes (Signed)
Patient here today for follow up.  Patient denies any nausea, vomiting, diarrhea or pain.  Patient does state that he has some recent constipation, using lactulose is helpful.

## 2019-04-02 ENCOUNTER — Inpatient Hospital Stay: Payer: PPO

## 2019-04-02 NOTE — Progress Notes (Signed)
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  Patient receiving chemotherapy.    Spoke with patient via phone.  Patient reports that appetite has been decreased yesterday and today due to treatment.  Reports that appetite is usually poor for about 3 days after treatment.  Reports that he was able to eat a gravy biscuit this am but not much else.  Reports that he has been drinking mostly water and gatorade.  Also has been drinking ensure plus and eating egg custard.  Reports egg custard taste really good to him right now.  Reports that once his appetite comes back knows that he needs to eat good sources of protein (meat, eggs, drinks milk, cheese, ice cream)  Reports that has not had issues with nausea and constipation if resolved.     Medications: megace (reports took one today)   Labs: reviewed   Anthropometrics:   Weight decreased to 180 lb note on 9/23    NUTRITION DIAGNOSIS: Inadequate oral intake continues    INTERVENTION:  Discussed ways to increase calories and protein.   Will mail high calorie, high protein recipes to patient (one is baked custard).  Encouraged patient to continue to drink oral nutrition supplements.    MONITORING, EVALUATION, GOAL: Patient will consume adequate calories and protein to maintain weight   Next Visit: phone f/u October 14  Neelah Mannings B. Zenia Resides, [place], [place] Registered Dietitian [phone] (pager)

## 2019-04-08 ENCOUNTER — Inpatient Hospital Stay: Payer: PPO

## 2019-04-08 ENCOUNTER — Inpatient Hospital Stay: Payer: PPO | Admitting: *Deleted

## 2019-04-08 ENCOUNTER — Encounter: Payer: Self-pay | Admitting: Oncology

## 2019-04-08 ENCOUNTER — Inpatient Hospital Stay (HOSPITAL_BASED_OUTPATIENT_CLINIC_OR_DEPARTMENT_OTHER): Payer: PPO | Admitting: Oncology

## 2019-04-08 ENCOUNTER — Encounter: Payer: Self-pay | Admitting: *Deleted

## 2019-04-08 ENCOUNTER — Other Ambulatory Visit: Payer: Self-pay

## 2019-04-08 VITALS — BP 133/79 | HR 82 | Temp 97.7°F | Resp 20 | Wt 180.0 lb

## 2019-04-08 DIAGNOSIS — C221 Intrahepatic bile duct carcinoma: Secondary | ICD-10-CM

## 2019-04-08 DIAGNOSIS — Z5111 Encounter for antineoplastic chemotherapy: Secondary | ICD-10-CM | POA: Diagnosis not present

## 2019-04-08 DIAGNOSIS — Z95828 Presence of other vascular implants and grafts: Secondary | ICD-10-CM

## 2019-04-08 DIAGNOSIS — D649 Anemia, unspecified: Secondary | ICD-10-CM | POA: Diagnosis not present

## 2019-04-08 LAB — COMPREHENSIVE METABOLIC PANEL
ALT: 16 U/L (ref 0–44)
AST: 25 U/L (ref 15–41)
Albumin: 2.8 g/dL — ABNORMAL LOW (ref 3.5–5.0)
Alkaline Phosphatase: 513 U/L — ABNORMAL HIGH (ref 38–126)
Anion gap: 9 (ref 5–15)
BUN: 16 mg/dL (ref 8–23)
CO2: 21 mmol/L — ABNORMAL LOW (ref 22–32)
Calcium: 8.7 mg/dL — ABNORMAL LOW (ref 8.9–10.3)
Chloride: 103 mmol/L (ref 98–111)
Creatinine, Ser: 0.65 mg/dL (ref 0.61–1.24)
GFR calc Af Amer: >60 mL/min (ref >60)
GFR calc non Af Amer: >60 mL/min (ref >60)
Glucose, Bld: 92 mg/dL (ref 70–99)
Potassium: 4 mmol/L (ref 3.5–5.1)
Sodium: 133 mmol/L — ABNORMAL LOW (ref 135–145)
Total Bilirubin: 0.6 mg/dL (ref 0.3–1.2)
Total Protein: 7.1 g/dL (ref 6.5–8.1)

## 2019-04-08 LAB — CBC WITH DIFFERENTIAL/PLATELET
Abs Immature Granulocytes: 0.14 10*3/uL — ABNORMAL HIGH (ref 0.00–0.07)
Basophils Absolute: 0.1 10*3/uL (ref 0.0–0.1)
Basophils Relative: 1 %
Eosinophils Absolute: 0.1 10*3/uL (ref 0.0–0.5)
Eosinophils Relative: 1 %
HCT: 22 % — ABNORMAL LOW (ref 39.0–52.0)
Hemoglobin: 6.9 g/dL — ABNORMAL LOW (ref 13.0–17.0)
Immature Granulocytes: 1 %
Lymphocytes Relative: 9 %
Lymphs Abs: 1.2 10*3/uL (ref 0.7–4.0)
MCH: 25.6 pg — ABNORMAL LOW (ref 26.0–34.0)
MCHC: 31.4 g/dL (ref 30.0–36.0)
MCV: 81.5 fL (ref 80.0–100.0)
Monocytes Absolute: 1.5 10*3/uL — ABNORMAL HIGH (ref 0.1–1.0)
Monocytes Relative: 11 %
Neutro Abs: 11.2 10*3/uL — ABNORMAL HIGH (ref 1.7–7.7)
Neutrophils Relative %: 77 %
Platelets: 111 10*3/uL — ABNORMAL LOW (ref 150–400)
RBC: 2.7 MIL/uL — ABNORMAL LOW (ref 4.22–5.81)
RDW: 23.5 % — ABNORMAL HIGH (ref 11.5–15.5)
WBC: 14.2 10*3/uL — ABNORMAL HIGH (ref 4.0–10.5)
nRBC: 0 % (ref 0.0–0.2)

## 2019-04-08 LAB — PREPARE RBC (CROSSMATCH)

## 2019-04-08 LAB — ABO/RH: ABO/RH(D): O NEG

## 2019-04-08 MED ORDER — HEPARIN SOD (PORK) LOCK FLUSH 100 UNIT/ML IV SOLN
500.0000 [IU] | Freq: Once | INTRAVENOUS | Status: AC | PRN
  Administered 2019-04-08: 500 [IU]
  Filled 2019-04-08: qty 5

## 2019-04-08 MED ORDER — SODIUM CHLORIDE 0.9 % IV SOLN
2000.0000 mg | Freq: Once | INTRAVENOUS | Status: AC
  Administered 2019-04-08: 2000 mg via INTRAVENOUS
  Filled 2019-04-08: qty 52.6

## 2019-04-08 MED ORDER — PROCHLORPERAZINE MALEATE 10 MG PO TABS
10.0000 mg | ORAL_TABLET | Freq: Once | ORAL | Status: AC
  Administered 2019-04-08: 10 mg via ORAL
  Filled 2019-04-08: qty 1

## 2019-04-08 MED ORDER — SODIUM CHLORIDE 0.9 % IV SOLN
Freq: Once | INTRAVENOUS | Status: AC
  Administered 2019-04-08: 11:00:00 via INTRAVENOUS
  Filled 2019-04-08: qty 250

## 2019-04-08 MED ORDER — SODIUM CHLORIDE 0.9% FLUSH
10.0000 mL | Freq: Once | INTRAVENOUS | Status: AC
  Administered 2019-04-08: 10 mL via INTRAVENOUS
  Filled 2019-04-08: qty 10

## 2019-04-08 NOTE — Progress Notes (Signed)
Hgb 6.9 today. Per NP ok to treat.

## 2019-04-08 NOTE — Progress Notes (Signed)
[place]  [phone]) [phone] Fax:(336) [phone]  ID: Jose Dunlap OB: April 21, 1951  MR#: [id number]  [id number]  Patient Care Team: Olin Hauser, DO as PCP - General (Family Medicine)  CHIEF COMPLAINT: Cholangiocarcinoma  INTERVAL HISTORY: Patient returns to clinic today for further evaluation and consideration of cycle 2 day 15 of cisplatin and gemcitabine.  Today is gemcitabine only.  He continues to have a poor appetite approximately 3 to 4 days posttreatment, but appetite returns to normal and he is able to eat and drink well.  He denies any diarrhea or constipation.  Admits to generalized weakness and fatigue.  Worse today.  He denies any pain today.  He denies any recent fevers or illnesses, chest pain, shortness of breath or hemoptysis.  Cough has resolved.  Denies any nausea or vomiting.  Denies any urinary complaints.  REVIEW OF SYSTEMS:   Review of Systems  Constitutional: Positive for malaise/fatigue and weight loss.  Neurological: Positive for weakness.  Psychiatric/Behavioral: Positive for depression.    As per HPI. Otherwise, a complete review of systems is negative.  PAST MEDICAL HISTORY: Past Medical History:  Diagnosis Date  . Essential hypertension   . Hyperlipidemia   . Liver cancer ([place])   . MI (myocardial infarction) ([place])   . Obesity   . Pacemaker    PACER/DEFIBRILLATOR [address] [place]  . Shortness of breath dyspnea     PAST SURGICAL HISTORY: Past Surgical History:  Procedure Laterality Date  . ABDOMINAL HERNIA REPAIR  2001  . BACK SURGERY  1986  . COLONOSCOPY WITH PROPOFOL N/A 05/19/2015   Procedure: COLONOSCOPY WITH PROPOFOL;  Surgeon: Hulen Luster, MD;  Location: [facility] ENDOSCOPY;  Service: Gastroenterology;  Laterality: N/A;  . CORONARY ANGIOPLASTY    . INSERT / REPLACE / REMOVE PACEMAKER    . IR IMAGING GUIDED PORT INSERTION  02/13/2019  . KNEE ARTHROSCOPY    . PACEMAKER PLACEMENT    .  PROSTATE SURGERY    . TONSILLECTOMY      FAMILY HISTORY: History reviewed. No pertinent family history.  ADVANCED DIRECTIVES (Y/N):  N  HEALTH MAINTENANCE: Social History   Tobacco Use  . Smoking status: Former Research scientist (life sciences)  . Smokeless tobacco: Former Network engineer Use Topics  . Alcohol use: Not Currently  . Drug use: No     Colonoscopy:  PAP:  Bone density:  Lipid panel:  No Known Allergies  Current Outpatient Medications  Medication Sig Dispense Refill  . albuterol (VENTOLIN HFA) 108 (90 Base) MCG/ACT inhaler Inhale 2 puffs into the lungs every 4 (four) hours as needed for wheezing or shortness of breath (cough). 8 g 0  . ALPRAZolam (XANAX) 0.5 MG tablet Take 1 tablet (0.5 mg total) by mouth at bedtime as needed for anxiety. 30 tablet 1  . aspirin 81 MG tablet Chew 81 mg by mouth daily.    . Cholecalciferol (D 1000) 1000 units capsule Take 1 capsule by mouth daily.    . clopidogrel (PLAVIX) 75 MG tablet Take 1 tablet (75 mg total) by mouth daily. 90 tablet 1  . fluticasone (FLONASE) 50 MCG/ACT nasal spray Place 2 sprays into both nostrils daily. 16 g 6  . Icosapent Ethyl (VASCEPA) 1 g CAPS Take 2 g by mouth 2 (two) times daily. 120 capsule 5  . lactulose (CHRONULAC) 10 GM/15ML solution Take 15 mLs (10 g total) by mouth 3 (three) times daily as needed for mild constipation. 240 mL 1  . lidocaine-prilocaine (  EMLA) cream Apply to affected area once 30 g 3  . Magnesium Cl-Calcium Carbonate (SLOW MAGNESIUM/CALCIUM) 70-117 MG TBEC Take 1 tablet by mouth daily.     . megestrol (MEGACE) 40 MG tablet Take 1 tablet (40 mg total) by mouth daily. 30 tablet 1  . Multiple Vitamin (MULTIVITAMIN) capsule Take 1 capsule by mouth daily.    . nebivolol (BYSTOLIC) 5 MG tablet Take 1 tablet (5 mg total) by mouth daily. 90 tablet 1  . nitroGLYCERIN (NITROSTAT) 0.4 MG SL tablet Place 1 tablet (0.4 mg total) under the tongue as needed. 25 tablet 4  . Omega-3 Fatty Acids (FISH OIL PO) Take 1 capsule  by mouth 2 (two) times daily.     . ondansetron (ZOFRAN) 8 MG tablet Take 1 tablet (8 mg total) by mouth 2 (two) times daily as needed. 30 tablet 2  . prochlorperazine (COMPAZINE) 10 MG tablet Take 1 tablet (10 mg total) by mouth every 6 (six) hours as needed (Nausea or vomiting). 60 tablet 2  . traZODone (DESYREL) 50 MG tablet Take 0.5-1 tablets (25-50 mg total) by mouth at bedtime as needed for sleep. 30 tablet 1  . vitamin B-12 (CYANOCOBALAMIN) 250 MCG tablet Take 1 tablet by mouth daily.     No current facility-administered medications for this visit.     OBJECTIVE: Vitals:   04/08/19 0931  BP: 133/79  Pulse: 82  Resp: 20  Temp: 97.7 F (36.5 C)  SpO2: 100%     Body mass index is 24.41 kg/m.    ECOG FS:1 - Symptomatic but completely ambulatory  Physical Exam Constitutional:      Comments: Appears fatigued  Skin:    Coloration: Skin is pale.  Neurological:     Mental Status: He is alert.     LAB RESULTS:  Lab Results  Component Value Date   NA 132 (L) 04/01/2019   K 4.0 04/01/2019   CL 99 04/01/2019   CO2 22 04/01/2019   GLUCOSE 92 04/01/2019   BUN 12 04/01/2019   CREATININE 0.69 04/01/2019   CALCIUM 8.9 04/01/2019   PROT 7.1 04/01/2019   ALBUMIN 2.9 (L) 04/01/2019   AST 30 04/01/2019   ALT 27 04/01/2019   ALKPHOS 702 (H) 04/01/2019   BILITOT 0.8 04/01/2019   GFRNONAA >60 04/01/2019   GFRAA >60 04/01/2019    Lab Results  Component Value Date   WBC 14.2 (H) 04/08/2019   NEUTROABS 11.2 (H) 04/08/2019   HGB 6.9 (L) 04/08/2019   HCT 22.0 (L) 04/08/2019   MCV 81.5 04/08/2019   PLT 111 (L) 04/08/2019     STUDIES: No results found.  ASSESSMENT: Cholangiocarcinoma.  PLAN:    1.Cholangiocarcinoma: PET scan results from January 26, 2019 reviewed independently with confluence hypermetabolic masses throughout the liver as well as porta hepatis and retroperitoneal lymphadenopathy.  Patient has a mildly's elevated CEA at 7.1, but the remainder of his tumor  markers are either negative or within normal limits.  Case was discussed with pathology and the most likely diagnosis is cholangiocarcinoma.  Patient wishes to pursue palliative chemotherapy.  Patient has now had port placed.  Proceed with cycle 2, day 15 of cisplatin and gemcitabine today.  Gemcitabine only.  Return to clinic in 2 week for further evaluation and consideration of cycle 3, day 1.   2.  Leukocytosis: Patient's white blood cell count remains elevated, but is trending down.  He has no obvious symptoms of infection, therefore may be reactive.  Monitor.  3.  Anemia: Hemoglobin continues to trend down is now 6.9.  He has a mild iron deficiency and may benefit from IV Feraheme in the future.  Will proceed with treatment and 1 unit PRBC first thing tomorrow morning. 4.  Insomnia/anxiety: Improved.  Appreciate palliative care input. 5.  Cough: resolved. 6.  Poor appetite: Improved.  Continue Megace as prescribed. 7.  Thrombocytosis: Resolved. 8.  Constipation:improved.   Patient expressed understanding and was in agreement with this plan. He also understands that He can call clinic at any time with any questions, concerns, or complaints.   Cancer Staging No matching staging information was found for the patient.  Jacquelin Hawking, NP   04/08/2019 9:35 AM

## 2019-04-09 ENCOUNTER — Other Ambulatory Visit: Payer: Self-pay

## 2019-04-09 ENCOUNTER — Inpatient Hospital Stay: Payer: PPO | Attending: Oncology

## 2019-04-09 DIAGNOSIS — K5900 Constipation, unspecified: Secondary | ICD-10-CM | POA: Diagnosis not present

## 2019-04-09 DIAGNOSIS — Z87891 Personal history of nicotine dependence: Secondary | ICD-10-CM | POA: Insufficient documentation

## 2019-04-09 DIAGNOSIS — T451X5A Adverse effect of antineoplastic and immunosuppressive drugs, initial encounter: Secondary | ICD-10-CM | POA: Diagnosis not present

## 2019-04-09 DIAGNOSIS — Z79899 Other long term (current) drug therapy: Secondary | ICD-10-CM | POA: Insufficient documentation

## 2019-04-09 DIAGNOSIS — I10 Essential (primary) hypertension: Secondary | ICD-10-CM | POA: Diagnosis not present

## 2019-04-09 DIAGNOSIS — C221 Intrahepatic bile duct carcinoma: Secondary | ICD-10-CM | POA: Diagnosis not present

## 2019-04-09 DIAGNOSIS — D509 Iron deficiency anemia, unspecified: Secondary | ICD-10-CM | POA: Insufficient documentation

## 2019-04-09 DIAGNOSIS — Z950 Presence of cardiac pacemaker: Secondary | ICD-10-CM | POA: Diagnosis not present

## 2019-04-09 DIAGNOSIS — D701 Agranulocytosis secondary to cancer chemotherapy: Secondary | ICD-10-CM | POA: Insufficient documentation

## 2019-04-09 DIAGNOSIS — F419 Anxiety disorder, unspecified: Secondary | ICD-10-CM | POA: Insufficient documentation

## 2019-04-09 DIAGNOSIS — R05 Cough: Secondary | ICD-10-CM | POA: Insufficient documentation

## 2019-04-09 DIAGNOSIS — D649 Anemia, unspecified: Secondary | ICD-10-CM

## 2019-04-09 DIAGNOSIS — Z5111 Encounter for antineoplastic chemotherapy: Secondary | ICD-10-CM | POA: Diagnosis not present

## 2019-04-09 DIAGNOSIS — I252 Old myocardial infarction: Secondary | ICD-10-CM | POA: Diagnosis not present

## 2019-04-09 DIAGNOSIS — D696 Thrombocytopenia, unspecified: Secondary | ICD-10-CM | POA: Diagnosis not present

## 2019-04-09 DIAGNOSIS — G4700 Insomnia, unspecified: Secondary | ICD-10-CM | POA: Diagnosis not present

## 2019-04-09 DIAGNOSIS — R630 Anorexia: Secondary | ICD-10-CM | POA: Diagnosis not present

## 2019-04-09 DIAGNOSIS — R970 Elevated carcinoembryonic antigen [CEA]: Secondary | ICD-10-CM | POA: Diagnosis not present

## 2019-04-09 MED ORDER — SODIUM CHLORIDE 0.9% IV SOLUTION
Freq: Once | INTRAVENOUS | Status: AC
  Administered 2019-04-09: 09:00:00 via INTRAVENOUS
  Filled 2019-04-09: qty 250

## 2019-04-09 MED ORDER — SODIUM CHLORIDE 0.9% FLUSH
10.0000 mL | Freq: Once | INTRAVENOUS | Status: AC
  Administered 2019-04-09: 10 mL via INTRAVENOUS
  Filled 2019-04-09: qty 10

## 2019-04-09 MED ORDER — DIPHENHYDRAMINE HCL 25 MG PO CAPS
25.0000 mg | ORAL_CAPSULE | Freq: Once | ORAL | Status: AC
  Administered 2019-04-09: 09:00:00 25 mg via ORAL
  Filled 2019-04-09: qty 1

## 2019-04-09 MED ORDER — HEPARIN SOD (PORK) LOCK FLUSH 100 UNIT/ML IV SOLN
500.0000 [IU] | Freq: Once | INTRAVENOUS | Status: AC
  Administered 2019-04-09: 12:00:00 500 [IU] via INTRAVENOUS
  Filled 2019-04-09: qty 5

## 2019-04-09 MED ORDER — ACETAMINOPHEN 325 MG PO TABS
650.0000 mg | ORAL_TABLET | Freq: Once | ORAL | Status: AC
  Administered 2019-04-09: 650 mg via ORAL
  Filled 2019-04-09: qty 2

## 2019-04-10 LAB — TYPE AND SCREEN
ABO/RH(D): O NEG
Antibody Screen: NEGATIVE
Unit division: 0

## 2019-04-10 LAB — BPAM RBC
Blood Product Expiration Date: 202010132359
ISSUE DATE / TIME: 202010010922
Unit Type and Rh: 9500

## 2019-04-14 ENCOUNTER — Encounter: Payer: Self-pay | Admitting: Nurse Practitioner

## 2019-04-14 ENCOUNTER — Other Ambulatory Visit: Payer: Self-pay

## 2019-04-14 ENCOUNTER — Ambulatory Visit (INDEPENDENT_AMBULATORY_CARE_PROVIDER_SITE_OTHER): Payer: PPO | Admitting: Nurse Practitioner

## 2019-04-14 DIAGNOSIS — R300 Dysuria: Secondary | ICD-10-CM | POA: Diagnosis not present

## 2019-04-14 MED ORDER — CEFDINIR 300 MG PO CAPS: 300.0000 mg | ORAL_CAPSULE | Freq: Two times a day (BID) | ORAL | 0 refills | Status: AC

## 2019-04-14 NOTE — Progress Notes (Signed)
Telemedicine Encounter: Disclosed to patient at start of encounter that we will provide appropriate telemedicine services.  Patient consents to be treated via phone prior to discussion. - Patient is at his home and is accessed via telephone. - Services are provided by Cassell Smiles from [facility].  Subjective:    Patient ID: Jose Dunlap, male    DOB: [birth year], 68 y.o.   MRN: [id number]  Jose Dunlap is a 68 y.o. male presenting on 04/14/2019 for Urinary Tract Infection (lower abdominal pain, urinary odor, bilateral flank pain, mostly on the left. patient feel like he possible running a temperature. He denies dysuria x 1 day)  HPI UTI symptoms Patient has had one UTI in past after prostate surgery after catheterization. Symptoms are the same.   Left side more pain than right.  Patient denies blood in urine. Patient feels flushed occasionally.  No chills or sweats.  Patient has not had pain with urination.  Patient feels more when lying down to sleep, very low abdominal pain.   Social History   Tobacco Use  . Smoking status: Former Research scientist (life sciences)  . Smokeless tobacco: Former Network engineer Use Topics  . Alcohol use: Not Currently  . Drug use: No    Review of Systems Per HPI unless specifically indicated above     Objective:    There were no vitals taken for this visit.  Wt Readings from Last 3 Encounters:  04/08/19 180 lb (81.6 kg)  04/01/19 180 lb 7 oz (81.8 kg)  03/25/19 185 lb (83.9 kg)    Physical Exam Patient remotely monitored.  Verbal communication appropriate.  Cognition normal.   Results for orders placed or performed in visit on 04/08/19  ABO/Rh  Result Value Ref Range   ABO/RH(D)      O NEG Performed at [facility], [address] [place], [place] [address]   Prepare RBC  Result Value Ref Range   Order Confirmation      ORDER PROCESSED BY BLOOD BANK Performed at [facility], [address] [place],  [place] [address]   Type and screen  Result Value Ref Range   ABO/RH(D) O NEG    Antibody Screen NEG    Sample Expiration 04/11/2019,2359    Unit Number [id number]    Blood Component Type RBC, LR IRR    Unit division 00    Status of Unit ISSUED,FINAL    Unit tag comment IRRADIATED PRODUCT    Transfusion Status OK TO TRANSFUSE    Crossmatch Result      Compatible Performed at [facility], [place], [place] [address]   BPAM [facility]  Result Value Ref Range   ISSUE DATE / TIME [id number]    Blood Product Unit Number [id number]    PRODUCT CODE [id number]    Unit Type and Rh 9500    Blood Product Expiration Date [id number]       Assessment & Plan:   Problem List Items Addressed This Visit    None    Visit Diagnoses    Dysuria    -  Primary   Relevant Medications   cefdinir (OMNICEF) 300 MG capsule     Dysuria likely from acute cystitis unknown if with hematuria. No gross hematuria.  Patient currently on cancer treatments and at higher risk of UTI.  Pt symptomatic currently with increased suprapubic pressure x 1 day. Currently without systemic signs or symptoms of infection.   - No current  risk of concurrent STI.  Plan: 1. START cefdinir 300 mg 2 times daily for next 5 days.   - If not improved after antibiotics, will need urinalysis and urine culture. 2. Provided non-pharm measures for UTI prevention for good hygiene. 3. Drink plenty of fluids and improve hydration over next 1 week. 4. Provided precautions for severe symptoms requiring ED visit to include no urine in 24-48 hours. 5. Followup 2-5 days as needed for worsening or persistent symptoms.   Meds ordered this encounter  Medications  . cefdinir (OMNICEF) 300 MG capsule    Sig: Take 1 capsule (300 mg total) by mouth 2 (two) times daily for 5 days.    Dispense:  10 capsule    Refill:  0    Order Specific Question:   Supervising Provider    Answer:   Olin Hauser [[id number]]   - Time  spent in direct consultation with patient via telemedicine about above concerns: 5 minutes  Follow up plan: 2-5 days prn no improvement, worsening.  Cassell Smiles, DNP, AGPCNP-BC Adult Gerontology Primary Care Nurse Practitioner [facility] 04/14/2019, 11:47 AM

## 2019-04-20 NOTE — Progress Notes (Signed)
[place]  Telephone:(336) [phone] Fax:(336) [phone]  ID: Jose Dunlap OB: April 26, 1951  MR#: [id number]  [id number]  Patient Care Team: Olin Hauser, DO as PCP - General (Family Medicine)  CHIEF COMPLAINT: Cholangiocarcinoma  INTERVAL HISTORY: Patient returns to clinic today for further evaluation and consideration of cycle 3, day 1 of cisplatin and gemcitabine.  He continues to have chronic weakness and fatigue.  He also has occasional right flank pain, but otherwise feels well. He has no neurologic complaints.  He denies any recent fevers or illnesses.  He has no chest pain, shortness of breath, or hemoptysis.  He continues to have chronic cough.  He denies any nausea, vomiting, constipation, or diarrhea.  He has no melena or hematochezia.  He has no urinary complaints.  Patient offers no further specific complaints today.  REVIEW OF SYSTEMS:   Review of Systems  Constitutional: Positive for malaise/fatigue. Negative for fever and weight loss.  Respiratory: Positive for cough. Negative for hemoptysis and shortness of breath.   Cardiovascular: Negative.  Negative for chest pain and leg swelling.  Gastrointestinal: Negative.  Negative for abdominal pain, blood in stool, constipation, diarrhea, melena, nausea and vomiting.  Genitourinary: Positive for flank pain. Negative for dysuria.  Musculoskeletal: Negative for back pain.  Skin: Negative.  Negative for rash.  Neurological: Positive for weakness. Negative for dizziness, focal weakness and headaches.  Psychiatric/Behavioral: Negative.  The patient is not nervous/anxious and does not have insomnia.     As per HPI. Otherwise, a complete review of systems is negative.  PAST MEDICAL HISTORY: Past Medical History:  Diagnosis Date  . Essential hypertension   . Hyperlipidemia   . Liver cancer ([place])   . MI (myocardial infarction) ([place])   . Obesity   . Pacemaker    PACER/DEFIBRILLATOR [address] [place]  . Shortness of breath dyspnea     PAST SURGICAL HISTORY: Past Surgical History:  Procedure Laterality Date  . ABDOMINAL HERNIA REPAIR  2001  . BACK SURGERY  1986  . COLONOSCOPY WITH PROPOFOL N/A 05/19/2015   Procedure: COLONOSCOPY WITH PROPOFOL;  Surgeon: Hulen Luster, MD;  Location: [facility] ENDOSCOPY;  Service: Gastroenterology;  Laterality: N/A;  . CORONARY ANGIOPLASTY    . INSERT / REPLACE / REMOVE PACEMAKER    . IR IMAGING GUIDED PORT INSERTION  02/13/2019  . KNEE ARTHROSCOPY    . PACEMAKER PLACEMENT    . PROSTATE SURGERY    . TONSILLECTOMY      FAMILY HISTORY: History reviewed. No pertinent family history.  ADVANCED DIRECTIVES (Y/N):  N  HEALTH MAINTENANCE: Social History   Tobacco Use  . Smoking status: Former Research scientist (life sciences)  . Smokeless tobacco: Former Network engineer Use Topics  . Alcohol use: Not Currently  . Drug use: No     Colonoscopy:  PAP:  Bone density:  Lipid panel:  No Known Allergies  Current Outpatient Medications  Medication Sig Dispense Refill  . albuterol (VENTOLIN HFA) 108 (90 Base) MCG/ACT inhaler Inhale 2 puffs into the lungs every 4 (four) hours as needed for wheezing or shortness of breath (cough). 8 g 0  . ALPRAZolam (XANAX) 0.5 MG tablet Take 1 tablet (0.5 mg total) by mouth at bedtime as needed for anxiety. 30 tablet 1  . aspirin 81 MG tablet Chew 81 mg by mouth daily.    . clopidogrel (PLAVIX) 75 MG tablet Take 1 tablet (75 mg total) by mouth daily. 90 tablet 1  . fluticasone (FLONASE) 50  MCG/ACT nasal spray Place 2 sprays into both nostrils daily. 16 g 6  . Icosapent Ethyl (VASCEPA) 1 g CAPS Take 2 g by mouth 2 (two) times daily. 120 capsule 5  . lactulose (CHRONULAC) 10 GM/15ML solution Take 15 mLs (10 g total) by mouth 3 (three) times daily as needed for mild constipation. 240 mL 1  . lidocaine-prilocaine (EMLA) cream Apply to affected area once 30 g 3  . Magnesium Cl-Calcium Carbonate (SLOW MAGNESIUM/CALCIUM) 70-117 MG TBEC  Take 1 tablet by mouth daily.     . megestrol (MEGACE) 40 MG tablet Take 1 tablet (40 mg total) by mouth daily. 30 tablet 1  . Multiple Vitamin (MULTIVITAMIN) capsule Take 1 capsule by mouth daily.    . nebivolol (BYSTOLIC) 5 MG tablet Take 1 tablet (5 mg total) by mouth daily. 90 tablet 1  . nitroGLYCERIN (NITROSTAT) 0.4 MG SL tablet Place 1 tablet (0.4 mg total) under the tongue as needed. 25 tablet 4  . ondansetron (ZOFRAN) 8 MG tablet Take 1 tablet (8 mg total) by mouth 2 (two) times daily as needed. 30 tablet 2  . prochlorperazine (COMPAZINE) 10 MG tablet Take 1 tablet (10 mg total) by mouth every 6 (six) hours as needed (Nausea or vomiting). 60 tablet 2  . traZODone (DESYREL) 50 MG tablet Take 1 tablet by mouth daily.     No current facility-administered medications for this visit.     OBJECTIVE: Vitals:   04/22/19 0903  BP: 111/73  Pulse: 97  Temp: (!) 97.2 F (36.2 C)     Body mass index is 24.01 kg/m.    ECOG FS:2 - Symptomatic, <50% confined to bed  General: Thin, no acute distress.  Sitting in a wheelchair. Eyes: Pink conjunctiva, anicteric sclera. HEENT: Normocephalic, moist mucous membranes. Lungs: Clear to auscultation bilaterally. Heart: Regular rate and rhythm. No rubs, murmurs, or gallops. Abdomen: Soft, nontender, nondistended. No organomegaly noted, normoactive bowel sounds. Musculoskeletal: No edema, cyanosis, or clubbing. Neuro: Alert, answering all questions appropriately. Cranial nerves grossly intact. Skin: No rashes or petechiae noted. Psych: Normal affect.  LAB RESULTS:  Lab Results  Component Value Date   NA 130 (L) 04/22/2019   K 3.8 04/22/2019   CL 98 04/22/2019   CO2 20 (L) 04/22/2019   GLUCOSE 133 (H) 04/22/2019   BUN 15 04/22/2019   CREATININE 0.73 04/22/2019   CALCIUM 8.7 (L) 04/22/2019   PROT 7.1 04/22/2019   ALBUMIN 2.7 (L) 04/22/2019   AST 42 (H) 04/22/2019   ALT 24 04/22/2019   ALKPHOS 864 (H) 04/22/2019   BILITOT 0.7 04/22/2019    GFRNONAA >60 04/22/2019   GFRAA >60 04/22/2019    Lab Results  Component Value Date   WBC 36.4 (H) 04/22/2019   NEUTROABS 30.8 (H) 04/22/2019   HGB 8.4 (L) 04/22/2019   HCT 26.7 (L) 04/22/2019   MCV 82.4 04/22/2019   PLT 462 (H) 04/22/2019     STUDIES: No results found.  ASSESSMENT: Cholangiocarcinoma.  PLAN:    1.Cholangiocarcinoma: PET scan results from January 26, 2019 reviewed independently with confluence hypermetabolic masses throughout the liver as well as porta hepatis and retroperitoneal lymphadenopathy.  Patient has a mildly's elevated CEA at 7.1, but the remainder of his tumor markers are either negative or within normal limits.  Case was discussed with pathology and the most likely diagnosis is cholangiocarcinoma.  Patient wishes to pursue palliative chemotherapy.  Patient has now had port placed.  Proceed with cycle 3, day 1 of cisplatin and  gemcitabine today.  Return to clinic in 1 week for further evaluation and consideration of cycle 3, day 8 which is gemcitabine only.    2.  Leukocytosis: Patient's white blood cell count remains persistently elevated.  He has no obvious symptoms of infection, therefore may be reactive.  Monitor. 3.  Anemia: Hemoglobin improved to 8.4.  He has a mild iron deficiency and may benefit from IV Feraheme in the future.  Will transfuse if his hemoglobin falls below 7.0. 4.  Insomnia/anxiety: Improved.  Appreciate palliative care input. 5.  Cough: Chronic and unchanged.  Continue Tessalon Perles as needed. 6.  Poor appetite: Improved.  Patient was given a refill of his Megace today. 7.  Thrombocytosis: Mild, monitor. 8.  Constipation: Improved.  Continue GoLYTELY as needed.   Patient expressed understanding and was in agreement with this plan. He also understands that He can call clinic at any time with any questions, concerns, or complaints.   Cancer Staging No matching staging information was found for the patient.  Lloyd Huger, MD    04/24/2019 6:00 AM

## 2019-04-22 ENCOUNTER — Other Ambulatory Visit: Payer: Self-pay

## 2019-04-22 ENCOUNTER — Encounter: Payer: Self-pay | Admitting: Oncology

## 2019-04-22 ENCOUNTER — Inpatient Hospital Stay (HOSPITAL_BASED_OUTPATIENT_CLINIC_OR_DEPARTMENT_OTHER): Payer: PPO | Admitting: Oncology

## 2019-04-22 ENCOUNTER — Inpatient Hospital Stay: Payer: PPO

## 2019-04-22 ENCOUNTER — Inpatient Hospital Stay: Payer: PPO | Admitting: *Deleted

## 2019-04-22 VITALS — BP 111/73 | HR 97 | Temp 97.2°F | Ht 72.0 in | Wt 177.0 lb

## 2019-04-22 DIAGNOSIS — Z5111 Encounter for antineoplastic chemotherapy: Secondary | ICD-10-CM | POA: Diagnosis not present

## 2019-04-22 DIAGNOSIS — Z95828 Presence of other vascular implants and grafts: Secondary | ICD-10-CM

## 2019-04-22 DIAGNOSIS — C221 Intrahepatic bile duct carcinoma: Secondary | ICD-10-CM

## 2019-04-22 LAB — COMPREHENSIVE METABOLIC PANEL
ALT: 24 U/L (ref 0–44)
AST: 42 U/L — ABNORMAL HIGH (ref 15–41)
Albumin: 2.7 g/dL — ABNORMAL LOW (ref 3.5–5.0)
Alkaline Phosphatase: 864 U/L — ABNORMAL HIGH (ref 38–126)
Anion gap: 12 (ref 5–15)
BUN: 15 mg/dL (ref 8–23)
CO2: 20 mmol/L — ABNORMAL LOW (ref 22–32)
Calcium: 8.7 mg/dL — ABNORMAL LOW (ref 8.9–10.3)
Chloride: 98 mmol/L (ref 98–111)
Creatinine, Ser: 0.73 mg/dL (ref 0.61–1.24)
GFR calc Af Amer: >60 mL/min (ref >60)
GFR calc non Af Amer: >60 mL/min (ref >60)
Glucose, Bld: 133 mg/dL — ABNORMAL HIGH (ref 70–99)
Potassium: 3.8 mmol/L (ref 3.5–5.1)
Sodium: 130 mmol/L — ABNORMAL LOW (ref 135–145)
Total Bilirubin: 0.7 mg/dL (ref 0.3–1.2)
Total Protein: 7.1 g/dL (ref 6.5–8.1)

## 2019-04-22 LAB — CBC WITH DIFFERENTIAL/PLATELET
Abs Immature Granulocytes: 1.38 10*3/uL — ABNORMAL HIGH (ref 0.00–0.07)
Basophils Absolute: 0.2 10*3/uL — ABNORMAL HIGH (ref 0.0–0.1)
Basophils Relative: 1 %
Eosinophils Absolute: 0.4 10*3/uL (ref 0.0–0.5)
Eosinophils Relative: 1 %
HCT: 26.7 % — ABNORMAL LOW (ref 39.0–52.0)
Hemoglobin: 8.4 g/dL — ABNORMAL LOW (ref 13.0–17.0)
Immature Granulocytes: 4 %
Lymphocytes Relative: 3 %
Lymphs Abs: 1.2 10*3/uL (ref 0.7–4.0)
MCH: 25.9 pg — ABNORMAL LOW (ref 26.0–34.0)
MCHC: 31.5 g/dL (ref 30.0–36.0)
MCV: 82.4 fL (ref 80.0–100.0)
Monocytes Absolute: 2.5 10*3/uL — ABNORMAL HIGH (ref 0.1–1.0)
Monocytes Relative: 7 %
Neutro Abs: 30.8 10*3/uL — ABNORMAL HIGH (ref 1.7–7.7)
Neutrophils Relative %: 84 %
Platelets: 462 10*3/uL — ABNORMAL HIGH (ref 150–400)
RBC: 3.24 MIL/uL — ABNORMAL LOW (ref 4.22–5.81)
RDW: 21.5 % — ABNORMAL HIGH (ref 11.5–15.5)
WBC: 36.4 10*3/uL — ABNORMAL HIGH (ref 4.0–10.5)
nRBC: 0 % (ref 0.0–0.2)

## 2019-04-22 MED ORDER — SODIUM CHLORIDE 0.9 % IV SOLN
2000.0000 mg | Freq: Once | INTRAVENOUS | Status: AC
  Administered 2019-04-22: 2000 mg via INTRAVENOUS
  Filled 2019-04-22: qty 52.6

## 2019-04-22 MED ORDER — SODIUM CHLORIDE 0.9 % IV SOLN
Freq: Once | INTRAVENOUS | Status: AC
  Administered 2019-04-22: 13:00:00 via INTRAVENOUS
  Filled 2019-04-22: qty 5

## 2019-04-22 MED ORDER — PALONOSETRON HCL INJECTION 0.25 MG/5ML
0.2500 mg | Freq: Once | INTRAVENOUS | Status: AC
  Administered 2019-04-22: 13:00:00 0.25 mg via INTRAVENOUS
  Filled 2019-04-22: qty 5

## 2019-04-22 MED ORDER — HEPARIN SOD (PORK) LOCK FLUSH 100 UNIT/ML IV SOLN
500.0000 [IU] | Freq: Once | INTRAVENOUS | Status: AC | PRN
  Administered 2019-04-22: 500 [IU]

## 2019-04-22 MED ORDER — POTASSIUM CHLORIDE 2 MEQ/ML IV SOLN
Freq: Once | INTRAVENOUS | Status: AC
  Administered 2019-04-22: 10:00:00 via INTRAVENOUS
  Filled 2019-04-22: qty 1000

## 2019-04-22 MED ORDER — SODIUM CHLORIDE 0.9% FLUSH
10.0000 mL | Freq: Once | INTRAVENOUS | Status: AC
  Administered 2019-04-22: 08:00:00 10 mL via INTRAVENOUS
  Filled 2019-04-22: qty 10

## 2019-04-22 MED ORDER — SODIUM CHLORIDE 0.9 % IV SOLN
70.0000 mg/m2 | Freq: Once | INTRAVENOUS | Status: AC
  Administered 2019-04-22: 14:00:00 153 mg via INTRAVENOUS
  Filled 2019-04-22: qty 100

## 2019-04-22 MED ORDER — SODIUM CHLORIDE 0.9 % IV SOLN
Freq: Once | INTRAVENOUS | Status: AC
  Administered 2019-04-22: 10:00:00 via INTRAVENOUS
  Filled 2019-04-22: qty 250

## 2019-04-22 NOTE — Progress Notes (Signed)
Patient stated that had been having abdominal pain.

## 2019-04-23 ENCOUNTER — Inpatient Hospital Stay: Payer: PPO

## 2019-04-24 ENCOUNTER — Inpatient Hospital Stay: Payer: PPO | Admitting: Hospice and Palliative Medicine

## 2019-04-24 ENCOUNTER — Telehealth: Payer: Self-pay

## 2019-04-24 MED ORDER — MEGESTROL ACETATE 40 MG PO TABS: 40.0000 mg | ORAL_TABLET | Freq: Every day | ORAL | 1 refills | Status: AC

## 2019-04-24 NOTE — Progress Notes (Signed)
[place]  Telephone:(336) [phone] Fax:(336) [phone]  ID: Jose Dunlap OB: March 29, 1951  MR#: [id number]  [id number]  Patient Care Team: Olin Hauser, DO as PCP - General (Family Medicine)  CHIEF COMPLAINT: Cholangiocarcinoma  INTERVAL HISTORY: Patient returns to clinic today for further evaluation and consideration of cycle 3, day 8 of cisplatin and gemcitabine.  Gemcitabine only today.  Patient has increased weakness and fatigue and a poor appetite, but states this is always worse week after he received cisplatin. He has no neurologic complaints.  He denies any recent fevers or illnesses.  He has no chest pain, shortness of breath, or hemoptysis.  He continues to have chronic cough.  He denies any nausea, vomiting, constipation, or diarrhea.  He has no melena or hematochezia.  He has no urinary complaints.  Patient offers no further specific complaints today.  REVIEW OF SYSTEMS:   Review of Systems  Constitutional: Positive for malaise/fatigue. Negative for fever and weight loss.  Respiratory: Positive for cough. Negative for hemoptysis and shortness of breath.   Cardiovascular: Negative.  Negative for chest pain and leg swelling.  Gastrointestinal: Negative.  Negative for abdominal pain, blood in stool, constipation, diarrhea, melena, nausea and vomiting.  Genitourinary: Positive for flank pain. Negative for dysuria.  Musculoskeletal: Negative for back pain.  Skin: Negative.  Negative for rash.  Neurological: Positive for weakness. Negative for dizziness, focal weakness and headaches.  Psychiatric/Behavioral: Negative.  The patient is not nervous/anxious and does not have insomnia.     As per HPI. Otherwise, a complete review of systems is negative.  PAST MEDICAL HISTORY: Past Medical History:  Diagnosis Date  . Essential hypertension   . Hyperlipidemia   . Liver cancer ([place])   . MI (myocardial infarction) ([place])   . Obesity   . Pacemaker     PACER/DEFIBRILLATOR [address], [place]  . Shortness of breath dyspnea     PAST SURGICAL HISTORY: Past Surgical History:  Procedure Laterality Date  . ABDOMINAL HERNIA REPAIR  2001  . BACK SURGERY  1986  . COLONOSCOPY WITH PROPOFOL N/A 05/19/2015   Procedure: COLONOSCOPY WITH PROPOFOL;  Surgeon: Hulen Luster, MD;  Location: [facility] ENDOSCOPY;  Service: Gastroenterology;  Laterality: N/A;  . CORONARY ANGIOPLASTY    . INSERT / REPLACE / REMOVE PACEMAKER    . IR IMAGING GUIDED PORT INSERTION  02/13/2019  . KNEE ARTHROSCOPY    . PACEMAKER PLACEMENT    . PROSTATE SURGERY    . TONSILLECTOMY      FAMILY HISTORY: History reviewed. No pertinent family history.  ADVANCED DIRECTIVES (Y/N):  N  HEALTH MAINTENANCE: Social History   Tobacco Use  . Smoking status: Former Research scientist (life sciences)  . Smokeless tobacco: Former Network engineer Use Topics  . Alcohol use: Not Currently  . Drug use: No     Colonoscopy:  PAP:  Bone density:  Lipid panel:  No Known Allergies  Current Outpatient Medications  Medication Sig Dispense Refill  . albuterol (VENTOLIN HFA) 108 (90 Base) MCG/ACT inhaler Inhale 2 puffs into the lungs every 4 (four) hours as needed for wheezing or shortness of breath (cough). 8 g 0  . ALPRAZolam (XANAX) 0.5 MG tablet Take 1 tablet (0.5 mg total) by mouth at bedtime as needed for anxiety. 30 tablet 1  . aspirin 81 MG tablet Chew 81 mg by mouth daily.    . clopidogrel (PLAVIX) 75 MG tablet Take 1 tablet (75 mg total) by mouth daily. 90 tablet 1  .  fluticasone (FLONASE) 50 MCG/ACT nasal spray Place 2 sprays into both nostrils daily. 16 g 6  . Icosapent Ethyl (VASCEPA) 1 g CAPS Take 2 g by mouth 2 (two) times daily. 120 capsule 5  . lactulose (CHRONULAC) 10 GM/15ML solution Take 15 mLs (10 g total) by mouth 3 (three) times daily as needed for mild constipation. 240 mL 1  . lidocaine-prilocaine (EMLA) cream Apply to affected area once 30 g 3  . Magnesium Cl-Calcium Carbonate (SLOW  MAGNESIUM/CALCIUM) 70-117 MG TBEC Take 1 tablet by mouth daily.     . megestrol (MEGACE) 40 MG tablet Take 1 tablet (40 mg total) by mouth daily. 30 tablet 1  . Multiple Vitamin (MULTIVITAMIN) capsule Take 1 capsule by mouth daily.    . nebivolol (BYSTOLIC) 5 MG tablet Take 1 tablet (5 mg total) by mouth daily. 90 tablet 1  . nitroGLYCERIN (NITROSTAT) 0.4 MG SL tablet Place 1 tablet (0.4 mg total) under the tongue as needed. 25 tablet 4  . ondansetron (ZOFRAN) 8 MG tablet Take 1 tablet (8 mg total) by mouth 2 (two) times daily as needed. 30 tablet 2  . prochlorperazine (COMPAZINE) 10 MG tablet Take 1 tablet (10 mg total) by mouth every 6 (six) hours as needed (Nausea or vomiting). 60 tablet 2  . traZODone (DESYREL) 50 MG tablet Take 1 tablet by mouth daily.     No current facility-administered medications for this visit.     OBJECTIVE: Vitals:   04/29/19 0837  BP: 127/73  Pulse: 71  Temp: (!) 97.3 F (36.3 C)     Body mass index is 24.01 kg/m.    ECOG FS:2 - Symptomatic, <50% confined to bed  General: Thin, no acute distress. Eyes: Pink conjunctiva, anicteric sclera. HEENT: Normocephalic, moist mucous membranes. Lungs: Clear to auscultation bilaterally. Heart: Regular rate and rhythm. No rubs, murmurs, or gallops. Abdomen: Soft, nontender, nondistended. No organomegaly noted, normoactive bowel sounds. Musculoskeletal: No edema, cyanosis, or clubbing. Neuro: Alert, answering all questions appropriately. Cranial nerves grossly intact. Skin: No rashes or petechiae noted. Psych: Normal affect.  LAB RESULTS:  Lab Results  Component Value Date   NA 130 (L) 04/29/2019   K 4.1 04/29/2019   CL 100 04/29/2019   CO2 21 (L) 04/29/2019   GLUCOSE 91 04/29/2019   BUN 9 04/29/2019   CREATININE 0.48 (L) 04/29/2019   CALCIUM 8.5 (L) 04/29/2019   PROT 6.9 04/29/2019   ALBUMIN 2.7 (L) 04/29/2019   AST 30 04/29/2019   ALT 22 04/29/2019   ALKPHOS 781 (H) 04/29/2019   BILITOT 0.8  04/29/2019   GFRNONAA >60 04/29/2019   GFRAA >60 04/29/2019    Lab Results  Component Value Date   WBC 23.9 (H) 04/29/2019   NEUTROABS 21.1 (H) 04/29/2019   HGB 8.0 (L) 04/29/2019   HCT 25.2 (L) 04/29/2019   MCV 83.2 04/29/2019   PLT 154 04/29/2019     STUDIES: No results found.  ASSESSMENT: Cholangiocarcinoma.  PLAN:    1.Cholangiocarcinoma: PET scan results from January 26, 2019 reviewed independently with confluence hypermetabolic masses throughout the liver as well as porta hepatis and retroperitoneal lymphadenopathy.  Patient has a mildly's elevated CEA at 7.1, but the remainder of his tumor markers are either negative or within normal limits.  Case was discussed with pathology and the most likely diagnosis is cholangiocarcinoma.  Proceed with cycle 3, day 8 of cisplatin and gemcitabine today.  Gemcitabine only.  Return to clinic in 1 week for further evaluation and consideration of  cycle 3, day 15.  Plan to reimage at the conclusion of cycle 3.   2.  Leukocytosis: Improving.  Patient's white blood cell count remains persistently elevated.  He has no obvious symptoms of infection, therefore may be reactive.  Monitor. 3.  Anemia: Hemoglobin continues to trend down is now 8.0.  He has a mild iron deficiency and may benefit from IV Feraheme in the future.  Will transfuse if his hemoglobin falls below 7.0. 4.  Insomnia/anxiety: Improved.  Appreciate palliative care input. 5.  Cough: Chronic and unchanged.  Continue Tessalon Perles as needed. 6.  Poor appetite: Improved.  Continue Megace as prescribed. 7.  Thrombocytosis: Resolved. 8.  Constipation: Patient does not complain of this today.  Continue GoLYTELY as needed.   Patient expressed understanding and was in agreement with this plan. He also understands that He can call clinic at any time with any questions, concerns, or complaints.   Cancer Staging No matching staging information was found for the patient.  Lloyd Huger,  MD   04/30/2019 6:03 AM

## 2019-04-24 NOTE — Telephone Encounter (Signed)
Jose Dunlap-patient's leave of absence department from [facility] wanted to know if patient needing to be out of work by  May 13, 2019. I told them that he was. Then they stated that if he needed additional time, to please send them a letter letting them know how long he needed to be out for. I told Jose Dunlap that we would.

## 2019-04-24 NOTE — Telephone Encounter (Signed)
Fax number # [phone] Fax letter with dates that patient is needing to be off from work due to his cancer treatments.

## 2019-04-29 ENCOUNTER — Inpatient Hospital Stay: Payer: PPO

## 2019-04-29 ENCOUNTER — Other Ambulatory Visit: Payer: Self-pay

## 2019-04-29 ENCOUNTER — Inpatient Hospital Stay (HOSPITAL_BASED_OUTPATIENT_CLINIC_OR_DEPARTMENT_OTHER): Payer: PPO | Admitting: Oncology

## 2019-04-29 ENCOUNTER — Encounter: Payer: Self-pay | Admitting: Oncology

## 2019-04-29 ENCOUNTER — Inpatient Hospital Stay: Payer: PPO | Admitting: *Deleted

## 2019-04-29 ENCOUNTER — Inpatient Hospital Stay: Payer: PPO | Admitting: Hospice and Palliative Medicine

## 2019-04-29 VITALS — BP 127/73 | HR 71 | Temp 97.3°F | Ht 72.0 in | Wt 177.0 lb

## 2019-04-29 DIAGNOSIS — C221 Intrahepatic bile duct carcinoma: Secondary | ICD-10-CM

## 2019-04-29 DIAGNOSIS — Z5111 Encounter for antineoplastic chemotherapy: Secondary | ICD-10-CM | POA: Diagnosis not present

## 2019-04-29 DIAGNOSIS — Z95828 Presence of other vascular implants and grafts: Secondary | ICD-10-CM

## 2019-04-29 LAB — CBC WITH DIFFERENTIAL/PLATELET
Abs Immature Granulocytes: 0.31 10*3/uL — ABNORMAL HIGH (ref 0.00–0.07)
Basophils Absolute: 0.1 10*3/uL (ref 0.0–0.1)
Basophils Relative: 0 %
Eosinophils Absolute: 0.1 10*3/uL (ref 0.0–0.5)
Eosinophils Relative: 1 %
HCT: 25.2 % — ABNORMAL LOW (ref 39.0–52.0)
Hemoglobin: 8 g/dL — ABNORMAL LOW (ref 13.0–17.0)
Immature Granulocytes: 1 %
Lymphocytes Relative: 4 %
Lymphs Abs: 0.9 10*3/uL (ref 0.7–4.0)
MCH: 26.4 pg (ref 26.0–34.0)
MCHC: 31.7 g/dL (ref 30.0–36.0)
MCV: 83.2 fL (ref 80.0–100.0)
Monocytes Absolute: 1.3 10*3/uL — ABNORMAL HIGH (ref 0.1–1.0)
Monocytes Relative: 6 %
Neutro Abs: 21.1 10*3/uL — ABNORMAL HIGH (ref 1.7–7.7)
Neutrophils Relative %: 88 %
Platelets: 154 10*3/uL (ref 150–400)
RBC: 3.03 MIL/uL — ABNORMAL LOW (ref 4.22–5.81)
RDW: 21.1 % — ABNORMAL HIGH (ref 11.5–15.5)
WBC: 23.9 10*3/uL — ABNORMAL HIGH (ref 4.0–10.5)
nRBC: 0 % (ref 0.0–0.2)

## 2019-04-29 LAB — COMPREHENSIVE METABOLIC PANEL
ALT: 22 U/L (ref 0–44)
AST: 30 U/L (ref 15–41)
Albumin: 2.7 g/dL — ABNORMAL LOW (ref 3.5–5.0)
Alkaline Phosphatase: 781 U/L — ABNORMAL HIGH (ref 38–126)
Anion gap: 9 (ref 5–15)
BUN: 9 mg/dL (ref 8–23)
CO2: 21 mmol/L — ABNORMAL LOW (ref 22–32)
Calcium: 8.5 mg/dL — ABNORMAL LOW (ref 8.9–10.3)
Chloride: 100 mmol/L (ref 98–111)
Creatinine, Ser: 0.48 mg/dL — ABNORMAL LOW (ref 0.61–1.24)
GFR calc Af Amer: >60 mL/min (ref >60)
GFR calc non Af Amer: >60 mL/min (ref >60)
Glucose, Bld: 91 mg/dL (ref 70–99)
Potassium: 4.1 mmol/L (ref 3.5–5.1)
Sodium: 130 mmol/L — ABNORMAL LOW (ref 135–145)
Total Bilirubin: 0.8 mg/dL (ref 0.3–1.2)
Total Protein: 6.9 g/dL (ref 6.5–8.1)

## 2019-04-29 MED ORDER — SODIUM CHLORIDE 0.9 % IV SOLN
Freq: Once | INTRAVENOUS | Status: AC
  Administered 2019-04-29: 09:00:00 via INTRAVENOUS
  Filled 2019-04-29: qty 250

## 2019-04-29 MED ORDER — PROCHLORPERAZINE MALEATE 10 MG PO TABS
10.0000 mg | ORAL_TABLET | Freq: Once | ORAL | Status: AC
  Administered 2019-04-29: 10 mg via ORAL
  Filled 2019-04-29: qty 1

## 2019-04-29 MED ORDER — SODIUM CHLORIDE 0.9% FLUSH
10.0000 mL | Freq: Once | INTRAVENOUS | Status: AC
  Administered 2019-04-29: 08:00:00 10 mL via INTRAVENOUS
  Filled 2019-04-29: qty 10

## 2019-04-29 MED ORDER — SODIUM CHLORIDE 0.9 % IV SOLN
2000.0000 mg | Freq: Once | INTRAVENOUS | Status: AC
  Administered 2019-04-29: 2000 mg via INTRAVENOUS
  Filled 2019-04-29: qty 52.6

## 2019-04-29 MED ORDER — HEPARIN SOD (PORK) LOCK FLUSH 100 UNIT/ML IV SOLN
500.0000 [IU] | Freq: Once | INTRAVENOUS | Status: AC | PRN
  Administered 2019-04-29: 11:00:00 500 [IU]
  Filled 2019-04-29: qty 5

## 2019-04-29 NOTE — Progress Notes (Signed)
Patient stated that he had not been feeling well. Patient stated that his appetite is not so good.

## 2019-05-03 NOTE — Progress Notes (Signed)
[place]  Telephone:(336) [phone] Fax:(336) [phone]  ID: Jose Dunlap OB: 1950/08/04  MR#: [id number]  [id number]  Patient Care Team: Olin Hauser, DO as PCP - General (Family Medicine)  CHIEF COMPLAINT: Cholangiocarcinoma  INTERVAL HISTORY: Patient returns to clinic today for further evaluation and consideration of cycle 3, day 15 of cisplatin and gemcitabine.  Gemcitabine only.  He continues to have chronic weakness and fatigue, but states this is mildly improved.  His appetite has improved as well. He has no neurologic complaints.  He denies any recent fevers or illnesses.  He has no chest pain, shortness of breath, or hemoptysis.  He continues to have chronic cough.  He denies any nausea, vomiting, constipation, or diarrhea.  He has no melena or hematochezia.  He has no urinary complaints.  Patient offers no further specific complaints today.  REVIEW OF SYSTEMS:   Review of Systems  Constitutional: Positive for malaise/fatigue. Negative for fever and weight loss.  Respiratory: Positive for cough. Negative for hemoptysis and shortness of breath.   Cardiovascular: Negative.  Negative for chest pain and leg swelling.  Gastrointestinal: Negative.  Negative for abdominal pain, blood in stool, constipation, diarrhea, melena, nausea and vomiting.  Genitourinary: Negative for dysuria and flank pain.  Musculoskeletal: Negative for back pain.  Skin: Negative.  Negative for rash.  Neurological: Positive for weakness. Negative for dizziness, focal weakness and headaches.  Psychiatric/Behavioral: Negative.  The patient is not nervous/anxious and does not have insomnia.     As per HPI. Otherwise, a complete review of systems is negative.  PAST MEDICAL HISTORY: Past Medical History:  Diagnosis Date  . Essential hypertension   . Hyperlipidemia   . Liver cancer ([place])   . MI (myocardial infarction) ([place])   . Obesity   . Pacemaker    PACER/DEFIBRILLATOR [address], [place]  . Shortness of breath dyspnea     PAST SURGICAL HISTORY: Past Surgical History:  Procedure Laterality Date  . ABDOMINAL HERNIA REPAIR  2001  . BACK SURGERY  1986  . COLONOSCOPY WITH PROPOFOL N/A 05/19/2015   Procedure: COLONOSCOPY WITH PROPOFOL;  Surgeon: Hulen Luster, MD;  Location: [facility] ENDOSCOPY;  Service: Gastroenterology;  Laterality: N/A;  . CORONARY ANGIOPLASTY    . INSERT / REPLACE / REMOVE PACEMAKER    . IR IMAGING GUIDED PORT INSERTION  02/13/2019  . KNEE ARTHROSCOPY    . PACEMAKER PLACEMENT    . PROSTATE SURGERY    . TONSILLECTOMY      FAMILY HISTORY: No family history on file.  ADVANCED DIRECTIVES (Y/N):  N  HEALTH MAINTENANCE: Social History   Tobacco Use  . Smoking status: Former Research scientist (life sciences)  . Smokeless tobacco: Former Network engineer Use Topics  . Alcohol use: Not Currently  . Drug use: No     Colonoscopy:  PAP:  Bone density:  Lipid panel:  No Known Allergies  Current Outpatient Medications  Medication Sig Dispense Refill  . albuterol (VENTOLIN HFA) 108 (90 Base) MCG/ACT inhaler Inhale 2 puffs into the lungs every 4 (four) hours as needed for wheezing or shortness of breath (cough). 8 g 0  . ALPRAZolam (XANAX) 0.5 MG tablet Take 1 tablet (0.5 mg total) by mouth at bedtime as needed for anxiety. 30 tablet 1  . aspirin 81 MG tablet Chew 81 mg by mouth daily.    . clopidogrel (PLAVIX) 75 MG tablet Take 1 tablet (75 mg total) by mouth daily. 90 tablet 1  . fluticasone (FLONASE)  50 MCG/ACT nasal spray Place 2 sprays into both nostrils daily. 16 g 6  . Icosapent Ethyl (VASCEPA) 1 g CAPS Take 2 g by mouth 2 (two) times daily. 120 capsule 5  . lactulose (CHRONULAC) 10 GM/15ML solution Take 15 mLs (10 g total) by mouth 3 (three) times daily as needed for mild constipation. 240 mL 1  . lidocaine-prilocaine (EMLA) cream Apply to affected area once 30 g 3  . Magnesium Cl-Calcium Carbonate (SLOW MAGNESIUM/CALCIUM)  70-117 MG TBEC Take 1 tablet by mouth daily.     . megestrol (MEGACE) 40 MG tablet Take 1 tablet (40 mg total) by mouth daily. 30 tablet 1  . Multiple Vitamin (MULTIVITAMIN) capsule Take 1 capsule by mouth daily.    . nebivolol (BYSTOLIC) 5 MG tablet Take 1 tablet (5 mg total) by mouth daily. 90 tablet 1  . nitroGLYCERIN (NITROSTAT) 0.4 MG SL tablet Place 1 tablet (0.4 mg total) under the tongue as needed. 25 tablet 4  . ondansetron (ZOFRAN) 8 MG tablet Take 1 tablet (8 mg total) by mouth 2 (two) times daily as needed. 30 tablet 2  . prochlorperazine (COMPAZINE) 10 MG tablet Take 1 tablet (10 mg total) by mouth every 6 (six) hours as needed (Nausea or vomiting). 60 tablet 2  . traZODone (DESYREL) 50 MG tablet Take 1 tablet by mouth daily.     No current facility-administered medications for this visit.     OBJECTIVE: Vitals:   05/06/19 1003  BP: 102/69  Pulse: 83  Resp: 18  Temp: 97.7 F (36.5 C)     Body mass index is 23.53 kg/m.    ECOG FS:2 - Symptomatic, <50% confined to bed  General: Thin, no acute distress. Eyes: Pink conjunctiva, anicteric sclera. HEENT: Normocephalic, moist mucous membranes, clear oropharnyx. Lungs: Clear to auscultation bilaterally. Heart: Regular rate and rhythm. No rubs, murmurs, or gallops. Abdomen: Soft, nontender, nondistended. No organomegaly noted, normoactive bowel sounds. Musculoskeletal: No edema, cyanosis, or clubbing. Neuro: Alert, answering all questions appropriately. Cranial nerves grossly intact. Skin: No rashes or petechiae noted. Psych: Normal affect.  LAB RESULTS:  Lab Results  Component Value Date   NA 135 05/06/2019   K 3.8 05/06/2019   CL 102 05/06/2019   CO2 20 (L) 05/06/2019   GLUCOSE 109 (H) 05/06/2019   BUN 13 05/06/2019   CREATININE 0.63 05/06/2019   CALCIUM 8.6 (L) 05/06/2019   PROT 6.6 05/06/2019   ALBUMIN 2.7 (L) 05/06/2019   AST 29 05/06/2019   ALT 13 05/06/2019   ALKPHOS 567 (H) 05/06/2019   BILITOT 0.5  05/06/2019   GFRNONAA >60 05/06/2019   GFRAA >60 05/06/2019    Lab Results  Component Value Date   WBC 15.3 (H) 05/06/2019   NEUTROABS 13.1 (H) 05/06/2019   HGB 7.3 (L) 05/06/2019   HCT 22.9 (L) 05/06/2019   MCV 84.5 05/06/2019   PLT 50 (L) 05/06/2019     STUDIES: No results found.  ASSESSMENT: Cholangiocarcinoma.  PLAN:    1.Cholangiocarcinoma: PET scan results from January 26, 2019 reviewed independently with confluence hypermetabolic masses throughout the liver as well as porta hepatis and retroperitoneal lymphadenopathy.  Patient has a mildly's elevated CEA at 7.1, but the remainder of his tumor markers are either negative or within normal limits.  Case was discussed with pathology and the most likely diagnosis is cholangiocarcinoma.  Delay cycle 3, day 15 of gemcitabine today secondary to neutropenia and thrombocytopenia.  Return to clinic in 1 week for further evaluation and reconsideration  of treatment. 2.  Neutropenia: Secondary to treatment.  Delay treatment as above. 3.  Anemia: Patient's hemoglobin continues to trend down and is now 7.3.  Previously, patient was noted to be iron deficient.  Will add IV Venofer with next treatment.  Transfuse if his hemoglobin falls below 7.0. 4.  Insomnia/anxiety: Improved.  Appreciate palliative care input. 5.  Cough: Chronic and unchanged.  Continue Tessalon Perles as needed. 6.  Poor appetite: Improved.  Continue Megace as prescribed. 7.  Thrombocytopenia: Delay treatment as above. 8.  Constipation: Patient does not complain of this today.  Continue GoLYTELY as needed.   Patient expressed understanding and was in agreement with this plan. He also understands that He can call clinic at any time with any questions, concerns, or complaints.   Cancer Staging No matching staging information was found for the patient.  Lloyd Huger, MD   05/07/2019 12:20 PM

## 2019-05-04 ENCOUNTER — Inpatient Hospital Stay: Payer: PPO

## 2019-05-04 NOTE — Progress Notes (Signed)
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  Patient receiving chemotherapy.    Spoke with patient via phone.  Patient reports that his appetite is not good.  "I am doing the best I can."  Did receive recipes in the mail but has not felt like preparing anything.  "I can only eat a little bit at a time."  Still trying to drink some ensure plus.  Patient reports that he has had a UTI recently and not felt good.      Medications: reviewed  Labs: reviewed  Anthropometrics:   Weight decreased to 177 lb on 10/21 from 180 lb noted on 9/23   NUTRITION DIAGNOSIS: Inadequate oral intake continues   INTERVENTION: Patient may benefit from increased dose of appetite stimulant.  Will send message to provider.  Encouraged patient to continue with high calorie, high protein foods frequently.   Patient declined follow-up call from RD and prefers to reach out to RD if needed in the future.     MONITORING, EVALUATION, GOAL: Patient will consume adequate calories and protein to maintain weight   NEXT VISIT: patient to call RD if needed  Adelaine Roppolo B. Zenia Resides, [place], [place] Registered Dietitian [phone] (pager)

## 2019-05-05 ENCOUNTER — Other Ambulatory Visit: Payer: Self-pay

## 2019-05-05 NOTE — Progress Notes (Signed)
Patient pre screened for office appointment, no questions or concerns today. 

## 2019-05-06 ENCOUNTER — Inpatient Hospital Stay: Payer: PPO

## 2019-05-06 ENCOUNTER — Inpatient Hospital Stay (HOSPITAL_BASED_OUTPATIENT_CLINIC_OR_DEPARTMENT_OTHER): Payer: PPO | Admitting: Oncology

## 2019-05-06 ENCOUNTER — Inpatient Hospital Stay: Payer: PPO | Admitting: *Deleted

## 2019-05-06 ENCOUNTER — Encounter: Payer: Self-pay | Admitting: Oncology

## 2019-05-06 ENCOUNTER — Other Ambulatory Visit: Payer: Self-pay

## 2019-05-06 VITALS — BP 102/69 | HR 83 | Temp 97.7°F | Resp 18 | Wt 173.5 lb

## 2019-05-06 DIAGNOSIS — Z5111 Encounter for antineoplastic chemotherapy: Secondary | ICD-10-CM | POA: Diagnosis not present

## 2019-05-06 DIAGNOSIS — D509 Iron deficiency anemia, unspecified: Secondary | ICD-10-CM

## 2019-05-06 DIAGNOSIS — C221 Intrahepatic bile duct carcinoma: Secondary | ICD-10-CM | POA: Diagnosis not present

## 2019-05-06 DIAGNOSIS — Z452 Encounter for adjustment and management of vascular access device: Secondary | ICD-10-CM

## 2019-05-06 DIAGNOSIS — Z95828 Presence of other vascular implants and grafts: Secondary | ICD-10-CM

## 2019-05-06 LAB — CBC WITH DIFFERENTIAL/PLATELET
Abs Immature Granulocytes: 0.16 10*3/uL — ABNORMAL HIGH (ref 0.00–0.07)
Basophils Absolute: 0.1 10*3/uL (ref 0.0–0.1)
Basophils Relative: 0 %
Eosinophils Absolute: 0.1 10*3/uL (ref 0.0–0.5)
Eosinophils Relative: 0 %
HCT: 22.9 % — ABNORMAL LOW (ref 39.0–52.0)
Hemoglobin: 7.3 g/dL — ABNORMAL LOW (ref 13.0–17.0)
Immature Granulocytes: 1 %
Lymphocytes Relative: 6 %
Lymphs Abs: 0.8 10*3/uL (ref 0.7–4.0)
MCH: 26.9 pg (ref 26.0–34.0)
MCHC: 31.9 g/dL (ref 30.0–36.0)
MCV: 84.5 fL (ref 80.0–100.0)
Monocytes Absolute: 1.1 10*3/uL — ABNORMAL HIGH (ref 0.1–1.0)
Monocytes Relative: 7 %
Neutro Abs: 13.1 10*3/uL — ABNORMAL HIGH (ref 1.7–7.7)
Neutrophils Relative %: 86 %
Platelets: 50 10*3/uL — ABNORMAL LOW (ref 150–400)
RBC: 2.71 MIL/uL — ABNORMAL LOW (ref 4.22–5.81)
RDW: 22 % — ABNORMAL HIGH (ref 11.5–15.5)
WBC: 15.3 10*3/uL — ABNORMAL HIGH (ref 4.0–10.5)
nRBC: 0 % (ref 0.0–0.2)

## 2019-05-06 LAB — COMPREHENSIVE METABOLIC PANEL
ALT: 13 U/L (ref 0–44)
AST: 29 U/L (ref 15–41)
Albumin: 2.7 g/dL — ABNORMAL LOW (ref 3.5–5.0)
Alkaline Phosphatase: 567 U/L — ABNORMAL HIGH (ref 38–126)
Anion gap: 13 (ref 5–15)
BUN: 13 mg/dL (ref 8–23)
CO2: 20 mmol/L — ABNORMAL LOW (ref 22–32)
Calcium: 8.6 mg/dL — ABNORMAL LOW (ref 8.9–10.3)
Chloride: 102 mmol/L (ref 98–111)
Creatinine, Ser: 0.63 mg/dL (ref 0.61–1.24)
GFR calc Af Amer: >60 mL/min (ref >60)
GFR calc non Af Amer: >60 mL/min (ref >60)
Glucose, Bld: 109 mg/dL — ABNORMAL HIGH (ref 70–99)
Potassium: 3.8 mmol/L (ref 3.5–5.1)
Sodium: 135 mmol/L (ref 135–145)
Total Bilirubin: 0.5 mg/dL (ref 0.3–1.2)
Total Protein: 6.6 g/dL (ref 6.5–8.1)

## 2019-05-06 MED ORDER — HEPARIN SOD (PORK) LOCK FLUSH 100 UNIT/ML IV SOLN
500.0000 [IU] | Freq: Once | INTRAVENOUS | Status: AC
  Administered 2019-05-06: 500 [IU]

## 2019-05-06 MED ORDER — SODIUM CHLORIDE 0.9% FLUSH
10.0000 mL | Freq: Once | INTRAVENOUS | Status: AC
  Administered 2019-05-06: 10 mL via INTRAVENOUS
  Filled 2019-05-06: qty 10

## 2019-05-06 NOTE — Progress Notes (Signed)
Pt in for follow up, states Rn called yesterday for pre assessment. Denies any changes including meds.

## 2019-05-07 DIAGNOSIS — D509 Iron deficiency anemia, unspecified: Secondary | ICD-10-CM | POA: Insufficient documentation

## 2019-05-10 NOTE — Progress Notes (Signed)
[facility]  [phone] [phone]  ID: Cassey Bateson OB: [birth year]  MR#: [id number]  [id number]  Patient Care Team: Olin Hauser, DO as PCP - General (Family Medicine)  CHIEF COMPLAINT: Cholangiocarcinoma  INTERVAL HISTORY: Patient returns to clinic today for further evaluation and reconsideration of cycle 3, day 15 of cisplatin and gemcitabine.  Gemcitabine only.  He continues to have chronic weakness and fatigue which is essentially unchanged.  He has a fair appetite and his weight has remained stable. He has no neurologic complaints.  He denies any recent fevers or illnesses.  He has no chest pain, shortness of breath, or hemoptysis.  He continues to have chronic cough.  He denies any nausea, vomiting, constipation, or diarrhea.  He has no melena or hematochezia.  He has no urinary complaints.  Patient offers no further specific complaints today.    REVIEW OF SYSTEMS:   Review of Systems  Constitutional: Positive for malaise/fatigue. Negative for fever and weight loss.  Respiratory: Positive for cough. Negative for hemoptysis and shortness of breath.   Cardiovascular: Negative.  Negative for chest pain and leg swelling.  Gastrointestinal: Negative.  Negative for abdominal pain, blood in stool, constipation, diarrhea, melena, nausea and vomiting.  Genitourinary: Negative for dysuria and flank pain.  Musculoskeletal: Negative for back pain.  Skin: Negative.  Negative for rash.  Neurological: Positive for weakness. Negative for dizziness, focal weakness and headaches.  Psychiatric/Behavioral: Negative.  The patient is not nervous/anxious and does not have insomnia.     As per HPI. Otherwise, a complete review of systems is negative.  PAST MEDICAL HISTORY: Past Medical History:  Diagnosis Date  . Essential hypertension   . Hyperlipidemia   . Liver cancer ([place])   . MI (myocardial infarction) ([place])   . Obesity   .  Pacemaker    PACER/DEFIBRILLATOR [address], [place]  . Shortness of breath dyspnea     PAST SURGICAL HISTORY: Past Surgical History:  Procedure Laterality Date  . ABDOMINAL HERNIA REPAIR  2001  . BACK SURGERY  1986  . COLONOSCOPY WITH PROPOFOL N/A 05/19/2015   Procedure: COLONOSCOPY WITH PROPOFOL;  Surgeon: Hulen Luster, MD;  Location: [facility];  Service: Gastroenterology;  Laterality: N/A;  . CORONARY ANGIOPLASTY    . INSERT / REPLACE / REMOVE PACEMAKER    . IR IMAGING GUIDED PORT INSERTION  02/13/2019  . KNEE ARTHROSCOPY    . PACEMAKER PLACEMENT    . PROSTATE SURGERY    . TONSILLECTOMY      FAMILY HISTORY: No family history on file.  ADVANCED DIRECTIVES (Y/N):  N  HEALTH MAINTENANCE: Social History   Tobacco Use  . Smoking status: Former Research scientist (life sciences)  . Smokeless tobacco: Former Network engineer Use Topics  . Alcohol use: Not Currently  . Drug use: No     Colonoscopy:  PAP:  Bone density:  Lipid panel:  No Known Allergies  Current Outpatient Medications  Medication Sig Dispense Refill  . albuterol (VENTOLIN HFA) 108 (90 Base) MCG/ACT inhaler Inhale 2 puffs into the lungs every 4 (four) hours as needed for wheezing or shortness of breath (cough). 8 g 0  . ALPRAZolam (XANAX) 0.5 MG tablet Take 1 tablet (0.5 mg total) by mouth at bedtime as needed for anxiety. 30 tablet 1  . aspirin 81 MG tablet Chew 81 mg by mouth daily.    . clopidogrel (PLAVIX) 75 MG tablet Take 1 tablet (75 mg total) by mouth daily. [place]  tablet 1  . fluticasone (FLONASE) 50 MCG/ACT nasal spray Place 2 sprays into both nostrils daily. 16 g 6  . Icosapent Ethyl (VASCEPA) 1 g CAPS Take 2 g by mouth 2 (two) times daily. 120 capsule 5  . lactulose (CHRONULAC) 10 GM/15ML solution Take 15 mLs (10 g total) by mouth 3 (three) times daily as needed for mild constipation. 240 mL 1  . lidocaine-prilocaine (EMLA) cream Apply to affected area once 30 g 3  . Magnesium Cl-Calcium Carbonate (SLOW  MAGNESIUM/CALCIUM) 70-117 MG TBEC Take 1 tablet by mouth daily.     . megestrol (MEGACE) 40 MG tablet Take 1 tablet (40 mg total) by mouth daily. 30 tablet 1  . Multiple Vitamin (MULTIVITAMIN) capsule Take 1 capsule by mouth daily.    . nebivolol (BYSTOLIC) 5 MG tablet Take 1 tablet (5 mg total) by mouth daily. 90 tablet 1  . nitroGLYCERIN (NITROSTAT) 0.4 MG SL tablet Place 1 tablet (0.4 mg total) under the tongue as needed. 25 tablet 4  . ondansetron (ZOFRAN) 8 MG tablet Take 1 tablet (8 mg total) by mouth 2 (two) times daily as needed. 30 tablet 2  . prochlorperazine (COMPAZINE) 10 MG tablet Take 1 tablet (10 mg total) by mouth every 6 (six) hours as needed (Nausea or vomiting). 60 tablet 2  . traZODone (DESYREL) 50 MG tablet Take 1 tablet by mouth daily.     No current facility-administered medications for this visit.    Facility-Administered Medications Ordered in Other Visits  Medication Dose Route Frequency Provider Last Rate Last Dose  . gemcitabine (GEMZAR) 2,000 mg in sodium chloride 0.9 % 250 mL chemo infusion  2,000 mg Intravenous Once Lloyd Huger, MD      . heparin lock flush 100 unit/mL  500 Units Intravenous Once Lloyd Huger, MD        OBJECTIVE: Vitals:   05/13/19 1002  BP: 106/74  Pulse: 88  Resp: 18  Temp: (!) 95.4 F (35.2 C)  SpO2: 100%     Body mass index is 23.34 kg/m.    ECOG FS:1 - Symptomatic but completely ambulatory  General: Thin, no acute distress. Eyes: Pink conjunctiva, anicteric sclera. HEENT: Normocephalic, moist mucous membranes. Lungs: Clear to auscultation bilaterally. Heart: Regular rate and rhythm. No rubs, murmurs, or gallops. Abdomen: Soft, nontender, nondistended. No organomegaly noted, normoactive bowel sounds. Musculoskeletal: No edema, cyanosis, or clubbing. Neuro: Alert, answering all questions appropriately. Cranial nerves grossly intact. Skin: No rashes or petechiae noted. Psych: Normal affect.  LAB RESULTS:  Lab  Results  Component Value Date   NA 131 (L) 05/13/2019   K 4.1 05/13/2019   CL 99 05/13/2019   CO2 19 (L) 05/13/2019   GLUCOSE 122 (H) 05/13/2019   BUN 11 05/13/2019   CREATININE 0.68 05/13/2019   CALCIUM 8.8 (L) 05/13/2019   PROT 6.7 05/13/2019   ALBUMIN 2.7 (L) 05/13/2019   AST 38 05/13/2019   ALT 14 05/13/2019   ALKPHOS 666 (H) 05/13/2019   BILITOT 0.6 05/13/2019   GFRNONAA >60 05/13/2019   GFRAA >60 05/13/2019    Lab Results  Component Value Date   WBC 25.6 (H) 05/13/2019   NEUTROABS 21.6 (H) 05/13/2019   HGB 8.2 (L) 05/13/2019   HCT 25.7 (L) 05/13/2019   MCV 85.1 05/13/2019   PLT 399 05/13/2019     STUDIES: No results found.  ASSESSMENT: Cholangiocarcinoma.  PLAN:    1.Cholangiocarcinoma: PET scan results from January 26, 2019 reviewed independently with confluence hypermetabolic masses  throughout the liver as well as porta hepatis and retroperitoneal lymphadenopathy.  Patient has a mildly's elevated CEA at 7.1, but the remainder of his tumor markers are either negative or within normal limits.  Case was discussed with pathology and the most likely diagnosis is cholangiocarcinoma.  Proceed with cycle 3, day 15 of gemcitabine only today.  Return to clinic in 2 weeks for further evaluation and consideration of cycle 4, day 1.  Will repeat imaging with CT scan prior to next treatment.   2.  Leukocytosis: Likely reactive, monitor. 3.  Anemia: Hemoglobin improved to 8.2.  Proceed with IV Venofer today.  Patient will also receive Venofer with his next treatment. 4.  Insomnia/anxiety: Improved.  Appreciate palliative care input. 5.  Cough: Chronic and unchanged.  Continue Tessalon Perles as needed. 6.  Poor appetite: Improved.  Continue Megace as prescribed. 7.  Thrombocytopenia: Resolved.  Proceed with treatment as above. 8.  Constipation: Patient does not complain of this today.  Continue GoLYTELY as needed.   Patient expressed understanding and was in agreement with this  plan. He also understands that He can call clinic at any time with any questions, concerns, or complaints.   Cancer Staging No matching staging information was found for the patient.  Lloyd Huger, MD   05/13/2019 11:04 AM

## 2019-05-12 ENCOUNTER — Other Ambulatory Visit: Payer: Self-pay

## 2019-05-12 NOTE — Progress Notes (Signed)
Patient pre screened for office appointment, no questions or concerns today. Patient screened for Covid 05/2019 results pending.

## 2019-05-13 ENCOUNTER — Inpatient Hospital Stay (HOSPITAL_BASED_OUTPATIENT_CLINIC_OR_DEPARTMENT_OTHER): Payer: PPO | Admitting: Oncology

## 2019-05-13 ENCOUNTER — Inpatient Hospital Stay: Payer: PPO

## 2019-05-13 ENCOUNTER — Inpatient Hospital Stay: Payer: PPO | Attending: Oncology

## 2019-05-13 ENCOUNTER — Other Ambulatory Visit: Payer: Self-pay | Admitting: Oncology

## 2019-05-13 ENCOUNTER — Other Ambulatory Visit: Payer: Self-pay

## 2019-05-13 VITALS — BP 106/74 | HR 88 | Temp 95.4°F | Resp 18 | Wt 172.1 lb

## 2019-05-13 DIAGNOSIS — D509 Iron deficiency anemia, unspecified: Secondary | ICD-10-CM

## 2019-05-13 DIAGNOSIS — R05 Cough: Secondary | ICD-10-CM | POA: Insufficient documentation

## 2019-05-13 DIAGNOSIS — Z950 Presence of cardiac pacemaker: Secondary | ICD-10-CM | POA: Diagnosis not present

## 2019-05-13 DIAGNOSIS — I10 Essential (primary) hypertension: Secondary | ICD-10-CM | POA: Insufficient documentation

## 2019-05-13 DIAGNOSIS — R1011 Right upper quadrant pain: Secondary | ICD-10-CM | POA: Diagnosis not present

## 2019-05-13 DIAGNOSIS — Z5111 Encounter for antineoplastic chemotherapy: Secondary | ICD-10-CM | POA: Diagnosis not present

## 2019-05-13 DIAGNOSIS — D72829 Elevated white blood cell count, unspecified: Secondary | ICD-10-CM | POA: Diagnosis not present

## 2019-05-13 DIAGNOSIS — R5383 Other fatigue: Secondary | ICD-10-CM | POA: Diagnosis not present

## 2019-05-13 DIAGNOSIS — R531 Weakness: Secondary | ICD-10-CM | POA: Diagnosis not present

## 2019-05-13 DIAGNOSIS — Z87891 Personal history of nicotine dependence: Secondary | ICD-10-CM | POA: Insufficient documentation

## 2019-05-13 DIAGNOSIS — G4700 Insomnia, unspecified: Secondary | ICD-10-CM | POA: Insufficient documentation

## 2019-05-13 DIAGNOSIS — I252 Old myocardial infarction: Secondary | ICD-10-CM | POA: Insufficient documentation

## 2019-05-13 DIAGNOSIS — C221 Intrahepatic bile duct carcinoma: Secondary | ICD-10-CM | POA: Insufficient documentation

## 2019-05-13 DIAGNOSIS — D649 Anemia, unspecified: Secondary | ICD-10-CM | POA: Diagnosis not present

## 2019-05-13 LAB — CBC WITH DIFFERENTIAL/PLATELET
Abs Immature Granulocytes: 0.4 10*3/uL — ABNORMAL HIGH (ref 0.00–0.07)
Basophils Absolute: 0.1 10*3/uL (ref 0.0–0.1)
Basophils Relative: 0 %
Eosinophils Absolute: 0.5 10*3/uL (ref 0.0–0.5)
Eosinophils Relative: 2 %
HCT: 25.7 % — ABNORMAL LOW (ref 39.0–52.0)
Hemoglobin: 8.2 g/dL — ABNORMAL LOW (ref 13.0–17.0)
Immature Granulocytes: 2 %
Lymphocytes Relative: 4 %
Lymphs Abs: 1 10*3/uL (ref 0.7–4.0)
MCH: 27.2 pg (ref 26.0–34.0)
MCHC: 31.9 g/dL (ref 30.0–36.0)
MCV: 85.1 fL (ref 80.0–100.0)
Monocytes Absolute: 1.9 10*3/uL — ABNORMAL HIGH (ref 0.1–1.0)
Monocytes Relative: 7 %
Neutro Abs: 21.6 10*3/uL — ABNORMAL HIGH (ref 1.7–7.7)
Neutrophils Relative %: 85 %
Platelets: 399 10*3/uL (ref 150–400)
RBC: 3.02 MIL/uL — ABNORMAL LOW (ref 4.22–5.81)
RDW: 21.6 % — ABNORMAL HIGH (ref 11.5–15.5)
WBC: 25.6 10*3/uL — ABNORMAL HIGH (ref 4.0–10.5)
nRBC: 0 % (ref 0.0–0.2)

## 2019-05-13 LAB — COMPREHENSIVE METABOLIC PANEL
ALT: 14 U/L (ref 0–44)
AST: 38 U/L (ref 15–41)
Albumin: 2.7 g/dL — ABNORMAL LOW (ref 3.5–5.0)
Alkaline Phosphatase: 666 U/L — ABNORMAL HIGH (ref 38–126)
Anion gap: 13 (ref 5–15)
BUN: 11 mg/dL (ref 8–23)
CO2: 19 mmol/L — ABNORMAL LOW (ref 22–32)
Calcium: 8.8 mg/dL — ABNORMAL LOW (ref 8.9–10.3)
Chloride: 99 mmol/L (ref 98–111)
Creatinine, Ser: 0.68 mg/dL (ref 0.61–1.24)
GFR calc Af Amer: >60 mL/min (ref >60)
GFR calc non Af Amer: >60 mL/min (ref >60)
Glucose, Bld: 122 mg/dL — ABNORMAL HIGH (ref 70–99)
Potassium: 4.1 mmol/L (ref 3.5–5.1)
Sodium: 131 mmol/L — ABNORMAL LOW (ref 135–145)
Total Bilirubin: 0.6 mg/dL (ref 0.3–1.2)
Total Protein: 6.7 g/dL (ref 6.5–8.1)

## 2019-05-13 MED ORDER — IRON SUCROSE 20 MG/ML IV SOLN
200.0000 mg | Freq: Once | INTRAVENOUS | Status: AC
  Administered 2019-05-13: 11:00:00 200 mg via INTRAVENOUS
  Filled 2019-05-13: qty 10

## 2019-05-13 MED ORDER — SODIUM CHLORIDE 0.9% FLUSH
10.0000 mL | Freq: Once | INTRAVENOUS | Status: AC
  Administered 2019-05-13: 10 mL via INTRAVENOUS
  Filled 2019-05-13: qty 10

## 2019-05-13 MED ORDER — HEPARIN SOD (PORK) LOCK FLUSH 100 UNIT/ML IV SOLN
500.0000 [IU] | Freq: Once | INTRAVENOUS | Status: AC
  Administered 2019-05-13: 500 [IU] via INTRAVENOUS
  Filled 2019-05-13: qty 5

## 2019-05-13 MED ORDER — SODIUM CHLORIDE 0.9 % IV SOLN
Freq: Once | INTRAVENOUS | Status: AC
  Administered 2019-05-13: 11:00:00 via INTRAVENOUS
  Filled 2019-05-13: qty 250

## 2019-05-13 MED ORDER — SODIUM CHLORIDE 0.9 % IV SOLN: 200.0000 mg | Freq: Once | INTRAVENOUS | Status: DC

## 2019-05-13 MED ORDER — PROCHLORPERAZINE MALEATE 10 MG PO TABS
10.0000 mg | ORAL_TABLET | Freq: Once | ORAL | Status: AC
  Administered 2019-05-13: 10 mg via ORAL
  Filled 2019-05-13: qty 1

## 2019-05-13 MED ORDER — SODIUM CHLORIDE 0.9 % IV SOLN
2000.0000 mg | Freq: Once | INTRAVENOUS | Status: AC
  Administered 2019-05-13: 2000 mg via INTRAVENOUS
  Filled 2019-05-13: qty 52.6

## 2019-05-22 NOTE — Progress Notes (Signed)
[name]  [phone] [phone]  ID: Jose Dunlap OB: 1951/03/21  MR#: [id number]  [id number]  Patient Care Team: Olin Hauser, DO as PCP - General (Family Medicine)  CHIEF COMPLAINT: Cholangiocarcinoma  INTERVAL HISTORY: Patient returns to clinic today for further evaluation and discussion of his imaging results.  He continues to have chronic weakness and fatigue.  He has increased right upper quadrant abdominal pain. He has a fair appetite and his weight has remained stable. He has no neurologic complaints.  He denies any recent fevers or illnesses.  He has no chest pain, shortness of breath, or hemoptysis.  He continues to have chronic cough.  He denies any nausea, vomiting, constipation, or diarrhea.  He has no melena or hematochezia.  He has no urinary complaints.  Patient offers no further specific complaints today.  REVIEW OF SYSTEMS:   Review of Systems  Constitutional: Positive for malaise/fatigue. Negative for fever and weight loss.  Respiratory: Positive for cough. Negative for hemoptysis and shortness of breath.   Cardiovascular: Negative.  Negative for chest pain and leg swelling.  Gastrointestinal: Positive for abdominal pain. Negative for blood in stool, constipation, diarrhea, melena, nausea and vomiting.  Genitourinary: Negative for dysuria and flank pain.  Musculoskeletal: Negative for back pain.  Skin: Negative.  Negative for rash.  Neurological: Positive for weakness. Negative for dizziness, focal weakness and headaches.  Psychiatric/Behavioral: Negative.  The patient is not nervous/anxious and does not have insomnia.     As per HPI. Otherwise, a complete review of systems is negative.  PAST MEDICAL HISTORY: Past Medical History:  Diagnosis Date   Essential hypertension    Hyperlipidemia    Liver cancer ([place]) 12/2018   MI (myocardial infarction) [facility])    Obesity    Pacemaker    PACER/DEFIBRILLATOR [address] [place] [employer]   Shortness of breath dyspnea     PAST SURGICAL HISTORY: Past Surgical History:  Procedure Laterality Date   ABDOMINAL HERNIA REPAIR  2001   BACK SURGERY  1986   COLONOSCOPY WITH PROPOFOL N/A 05/19/2015   Procedure: COLONOSCOPY WITH PROPOFOL;  Surgeon: Hulen Luster, MD;  Location: [facility] ENDOSCOPY;  Service: Gastroenterology;  Laterality: N/A;   CORONARY ANGIOPLASTY     INSERT / REPLACE / REMOVE PACEMAKER     IR IMAGING GUIDED PORT INSERTION  02/13/2019   KNEE ARTHROSCOPY     PACEMAKER PLACEMENT     PROSTATE SURGERY     TONSILLECTOMY      FAMILY HISTORY: No family history on file.  ADVANCED DIRECTIVES (Y/N):  N  HEALTH MAINTENANCE: Social History   Tobacco Use   Smoking status: Former Smoker   Smokeless tobacco: Former Network engineer Use Topics   Alcohol use: Not Currently   Drug use: No     Colonoscopy:  PAP:  Bone density:  Lipid panel:  No Known Allergies  Current Outpatient Medications  Medication Sig Dispense Refill   albuterol (VENTOLIN HFA) 108 (90 Base) MCG/ACT inhaler Inhale 2 puffs into the lungs every 4 (four) hours as needed for wheezing or shortness of breath (cough). 8 g 0   ALPRAZolam (XANAX) 0.5 MG tablet Take 1 tablet (0.5 mg total) by mouth at bedtime as needed for anxiety. 30 tablet 1   aspirin 81 MG tablet Chew 81 mg by mouth daily.     clopidogrel (PLAVIX) 75 MG tablet Take 1 tablet (75 mg total) by mouth daily. 90 tablet 1   fluticasone (FLONASE) 50  MCG/ACT nasal spray Place 2 sprays into both nostrils daily. 16 g 6   Icosapent Ethyl (VASCEPA) 1 g CAPS Take 2 g by mouth 2 (two) times daily. 120 capsule 5   lactulose (CHRONULAC) 10 GM/15ML solution Take 15 mLs (10 g total) by mouth 3 (three) times daily as needed for mild constipation. 240 mL 1   Magnesium Cl-Calcium Carbonate (SLOW MAGNESIUM/CALCIUM) 70-117 MG TBEC Take 1 tablet by mouth daily.      megestrol (MEGACE) 40 MG  tablet Take 1 tablet (40 mg total) by mouth daily. 30 tablet 1   Multiple Vitamin (MULTIVITAMIN) capsule Take 1 capsule by mouth daily.     nebivolol (BYSTOLIC) 5 MG tablet Take 1 tablet (5 mg total) by mouth daily. 90 tablet 1   nitroGLYCERIN (NITROSTAT) 0.4 MG SL tablet Place 1 tablet (0.4 mg total) under the tongue as needed. 25 tablet 4   traZODone (DESYREL) 50 MG tablet Take 1 tablet by mouth daily.     No current facility-administered medications for this visit.    Facility-Administered Medications Ordered in Other Visits  Medication Dose Route Frequency Provider Last Rate Last Dose   sodium chloride flush (NS) 0.9 % injection 10 mL  10 mL Intravenous PRN Lloyd Huger, MD   10 mL at 05/27/19 0855    OBJECTIVE: Vitals:   05/27/19 0904  BP: 107/73  Pulse: 90  Resp: 18  Temp: (!) 96.8 F (36 C)  SpO2: 100%     Body mass index is 23.64 kg/m.    ECOG FS:1 - Symptomatic but completely ambulatory  General: Thin, no acute distress. Eyes: Pink conjunctiva, anicteric sclera. HEENT: Normocephalic, moist mucous membranes. Lungs: Clear to auscultation bilaterally. Heart: Regular rate and rhythm. No rubs, murmurs, or gallops. Abdomen: Soft, nontender, nondistended. No organomegaly noted, normoactive bowel sounds. Musculoskeletal: No edema, cyanosis, or clubbing. Neuro: Alert, answering all questions appropriately. Cranial nerves grossly intact. Skin: No rashes or petechiae noted. Psych: Normal affect.  LAB RESULTS:  Lab Results  Component Value Date   NA 131 (L) 05/13/2019   K 4.1 05/13/2019   CL 99 05/13/2019   CO2 19 (L) 05/13/2019   GLUCOSE 122 (H) 05/13/2019   BUN 11 05/13/2019   CREATININE 0.68 05/13/2019   CALCIUM 8.8 (L) 05/13/2019   PROT 6.7 05/13/2019   ALBUMIN 2.7 (L) 05/13/2019   AST 38 05/13/2019   ALT 14 05/13/2019   ALKPHOS 666 (H) 05/13/2019   BILITOT 0.6 05/13/2019   GFRNONAA >60 05/13/2019   GFRAA >60 05/13/2019    Lab Results  Component  Value Date   WBC 31.8 (H) 05/27/2019   NEUTROABS 27.8 (H) 05/27/2019   HGB 8.0 (L) 05/27/2019   HCT 25.2 (L) 05/27/2019   MCV 87.2 05/27/2019   PLT 249 05/27/2019     STUDIES: Ct Chest W Contrast  Result Date: 05/25/2019 CLINICAL DATA:  Restaging cholangiocarcinoma diagnosed June 2020. 90 pound weight loss in 5 months. Intermittent abdominal pain. EXAM: CT CHEST, ABDOMEN, AND PELVIS WITH CONTRAST TECHNIQUE: Multidetector CT imaging of the chest, abdomen and pelvis was performed following the standard protocol during bolus administration of intravenous contrast. CONTRAST:  178mL OMNIPAQUE IOHEXOL 300 MG/ML  SOLN COMPARISON:  PET-CT 01/27/2019 and CT AP from 01/14/2019 and CT angio chest from 01/12/2019. FINDINGS: CT CHEST FINDINGS Cardiovascular: Normal heart size. Left chest wall ICD is noted with leads in the right atrial appendage, right ventricle and coronary sinus.No pericardial effusion identified. Mediastinum/Nodes: Normal appearance of the thyroid gland. The  trachea appears patent and is midline. Normal appearance of the esophagus. New left paratracheal lymph node is identified measuring 1 cm, image 28/2. New enhancing left pre-vascular node measures 8 mm, image 26/2. Left hilar lymph node is new measuring 1.2 cm, image 36/2. New enhancing lymph node within the anterior mediastinum measures 0.8 cm, image 31/2. Lungs/Pleura: No pleural effusion identified. Subsegmental atelectasis or scar noted in the right lower lobe. Pulmonary nodule in the superior segment of right lower lobe measures 4 mm, image 96/3. This is new compared with previous exam. There is a 4 mm nodule within the left lower lobe, image 114/3. New from previous exam. Tiny nodule within the right lower lobe measures 2 mm, image 109/3. New. Musculoskeletal: Choose 1 CT ABDOMEN PELVIS FINDINGS Hepatobiliary: Interval progression of disease. Innumerable lesions throughout the liver are too numerous to count. Individual lesions are  difficult to compare with previous exam. Gallbladder appears collapsed with pericholecystic fluid. Pancreas: Unremarkable. No pancreatic ductal dilatation or surrounding inflammatory changes. Spleen: The spleen is enlarged measuring 18.3 cm in cranial caudal dimension. Previously 15.5 cm. Adrenals/Urinary Tract: The adrenal glands are normal. Low-density cyst within upper pole of right kidney is again noted. Complex lesion arising from posterior cortex of the left kidney measures 1.8 cm and 74 HU. On the previous examination this measured the same. No hydronephrosis identified bilaterally. Partially collapsed urinary bladder is noted. There is a diverticulum arising from the posterolateral wall measuring 2.9 cm. Stomach/Bowel: Stomach is within normal limits. Appendix appears normal. No evidence of bowel wall thickening, distention, or inflammatory changes. Vascular/Lymphatic: Aortic atherosclerosis. Upper abdominal adenopathy is identified. Index portacaval lymph node measures 2.9 x 4.3 cm. Previously 3.2 x 1.2 cm. Left periaortic lymph node measures 1.8 cm, image 75/2. Previously 1.4 cm. 1.1 cm left periaortic node is identified. Previously 0.9 cm. Reproductive: Prostate is unremarkable. Other: New ascites within the abdomen and pelvis. Scattered small peritoneal nodules are identified which are new from the previous exam and concerning for peritoneal metastasis. Musculoskeletal: No acute or significant osseous findings. IMPRESSION: 1. Interval progression of disease within the chest, abdomen and pelvis. 2. Interval enlargement and enhancement of mediastinal and hilar lymph nodes concerning for nodal metastasis to the chest. Upper abdominal lymph nodes have also increased size in the interval. 3. Progression of liver metastasis. 4. Several tiny lung nodules are identified, not seen on previous imaging. 5. Splenomegaly.  Slightly increased in the interval 6. New ascites and small peritoneal nodules concerning for  peritoneal metastasis. 7. Aortic atherosclerosis. Aortic Atherosclerosis (ICD10-I70.0). Electronically Signed   By: Kerby Moors M.D.   On: 05/25/2019 15:58   Ct Abdomen Pelvis W Contrast  Result Date: 05/25/2019 CLINICAL DATA:  Restaging cholangiocarcinoma diagnosed June 2020. 90 pound weight loss in 5 months. Intermittent abdominal pain. EXAM: CT CHEST, ABDOMEN, AND PELVIS WITH CONTRAST TECHNIQUE: Multidetector CT imaging of the chest, abdomen and pelvis was performed following the standard protocol during bolus administration of intravenous contrast. CONTRAST:  187mL OMNIPAQUE IOHEXOL 300 MG/ML  SOLN COMPARISON:  PET-CT 01/27/2019 and CT AP from 01/14/2019 and CT angio chest from 01/12/2019. FINDINGS: CT CHEST FINDINGS Cardiovascular: Normal heart size. Left chest wall ICD is noted with leads in the right atrial appendage, right ventricle and coronary sinus.No pericardial effusion identified. Mediastinum/Nodes: Normal appearance of the thyroid gland. The trachea appears patent and is midline. Normal appearance of the esophagus. New left paratracheal lymph node is identified measuring 1 cm, image 28/2. New enhancing left pre-vascular node measures 8 mm,  image 26/2. Left hilar lymph node is new measuring 1.2 cm, image 36/2. New enhancing lymph node within the anterior mediastinum measures 0.8 cm, image 31/2. Lungs/Pleura: No pleural effusion identified. Subsegmental atelectasis or scar noted in the right lower lobe. Pulmonary nodule in the superior segment of right lower lobe measures 4 mm, image 96/3. This is new compared with previous exam. There is a 4 mm nodule within the left lower lobe, image 114/3. New from previous exam. Tiny nodule within the right lower lobe measures 2 mm, image 109/3. New. Musculoskeletal: Choose 1 CT ABDOMEN PELVIS FINDINGS Hepatobiliary: Interval progression of disease. Innumerable lesions throughout the liver are too numerous to count. Individual lesions are difficult to  compare with previous exam. Gallbladder appears collapsed with pericholecystic fluid. Pancreas: Unremarkable. No pancreatic ductal dilatation or surrounding inflammatory changes. Spleen: The spleen is enlarged measuring 18.3 cm in cranial caudal dimension. Previously 15.5 cm. Adrenals/Urinary Tract: The adrenal glands are normal. Low-density cyst within upper pole of right kidney is again noted. Complex lesion arising from posterior cortex of the left kidney measures 1.8 cm and 74 HU. On the previous examination this measured the same. No hydronephrosis identified bilaterally. Partially collapsed urinary bladder is noted. There is a diverticulum arising from the posterolateral wall measuring 2.9 cm. Stomach/Bowel: Stomach is within normal limits. Appendix appears normal. No evidence of bowel wall thickening, distention, or inflammatory changes. Vascular/Lymphatic: Aortic atherosclerosis. Upper abdominal adenopathy is identified. Index portacaval lymph node measures 2.9 x 4.3 cm. Previously 3.2 x 1.2 cm. Left periaortic lymph node measures 1.8 cm, image 75/2. Previously 1.4 cm. 1.1 cm left periaortic node is identified. Previously 0.9 cm. Reproductive: Prostate is unremarkable. Other: New ascites within the abdomen and pelvis. Scattered small peritoneal nodules are identified which are new from the previous exam and concerning for peritoneal metastasis. Musculoskeletal: No acute or significant osseous findings. IMPRESSION: 1. Interval progression of disease within the chest, abdomen and pelvis. 2. Interval enlargement and enhancement of mediastinal and hilar lymph nodes concerning for nodal metastasis to the chest. Upper abdominal lymph nodes have also increased size in the interval. 3. Progression of liver metastasis. 4. Several tiny lung nodules are identified, not seen on previous imaging. 5. Splenomegaly.  Slightly increased in the interval 6. New ascites and small peritoneal nodules concerning for peritoneal  metastasis. 7. Aortic atherosclerosis. Aortic Atherosclerosis (ICD10-I70.0). Electronically Signed   By: Kerby Moors M.D.   On: 05/25/2019 15:58    ASSESSMENT: Cholangiocarcinoma.  PLAN:    1.Cholangiocarcinoma: CT scan results from May 25, 2019 reviewed independently and reported as above with interval progression of disease.  Patient is unclear if he ever wishes to pursue second line treatment with FOLFOX or proceed with hospice.  He will return to clinic on Monday, June 01, 2019 for further evaluation and treatment if he chooses.    2.  Leukocytosis: Slightly worse today.  Monitor.   3.  Anemia: Hemoglobin trending down to 8.0.  Patient recently received IV Venofer.  Will consider adding in with to FOLFOX if patient chooses additional treatment.   4.  Insomnia/anxiety: Improved.  Appreciate palliative care input. 5.  Cough: Chronic and unchanged.  Continue Tessalon Perles as needed. 6.  Poor appetite: Improved.  Continue Megace as prescribed. 7.  Thrombocytopenia: Resolved.  8.  Constipation: Patient does not complain of this today.  Continue GoLYTELY as needed.   Patient expressed understanding and was in agreement with this plan. He also understands that He can call clinic at any  time with any questions, concerns, or complaints.   Cancer Staging No matching staging information was found for the patient.  Lloyd Huger, MD   05/27/2019 10:28 AM

## 2019-05-25 ENCOUNTER — Other Ambulatory Visit: Payer: Self-pay

## 2019-05-25 ENCOUNTER — Ambulatory Visit
Admission: RE | Admit: 2019-05-25 | Discharge: 2019-05-25 | Disposition: A | Discharge status: Home / Self Care | Payer: PPO | Source: Ambulatory Visit | Attending: Oncology | Admitting: Oncology

## 2019-05-25 DIAGNOSIS — C221 Intrahepatic bile duct carcinoma: Secondary | ICD-10-CM | POA: Diagnosis not present

## 2019-05-25 DIAGNOSIS — C787 Secondary malignant neoplasm of liver and intrahepatic bile duct: Secondary | ICD-10-CM | POA: Diagnosis not present

## 2019-05-25 MED ORDER — IOHEXOL 300 MG/ML  SOLN
100.0000 mL | Freq: Once | INTRAMUSCULAR | Status: AC | PRN
  Administered 2019-05-25: 100 mL via INTRAVENOUS

## 2019-05-26 ENCOUNTER — Other Ambulatory Visit: Payer: Self-pay

## 2019-05-26 NOTE — Progress Notes (Signed)
Patient pre screened for office appointment, no questions or concerns today. Patient reminded of upcoming appointment time and date. 

## 2019-05-27 ENCOUNTER — Other Ambulatory Visit: Payer: Self-pay | Admitting: Oncology

## 2019-05-27 ENCOUNTER — Inpatient Hospital Stay (HOSPITAL_BASED_OUTPATIENT_CLINIC_OR_DEPARTMENT_OTHER): Payer: PPO | Admitting: Oncology

## 2019-05-27 ENCOUNTER — Inpatient Hospital Stay: Payer: PPO

## 2019-05-27 ENCOUNTER — Other Ambulatory Visit: Payer: Self-pay

## 2019-05-27 VITALS — BP 107/73 | HR 90 | Temp 96.8°F | Resp 18 | Wt 174.3 lb

## 2019-05-27 DIAGNOSIS — D509 Iron deficiency anemia, unspecified: Secondary | ICD-10-CM

## 2019-05-27 DIAGNOSIS — Z5111 Encounter for antineoplastic chemotherapy: Secondary | ICD-10-CM | POA: Diagnosis not present

## 2019-05-27 DIAGNOSIS — C221 Intrahepatic bile duct carcinoma: Secondary | ICD-10-CM | POA: Diagnosis not present

## 2019-05-27 DIAGNOSIS — R160 Hepatomegaly, not elsewhere classified: Secondary | ICD-10-CM

## 2019-05-27 LAB — CBC WITH DIFFERENTIAL/PLATELET
Abs Immature Granulocytes: 0.96 10*3/uL — ABNORMAL HIGH (ref 0.00–0.07)
Basophils Absolute: 0.2 10*3/uL — ABNORMAL HIGH (ref 0.0–0.1)
Basophils Relative: 1 %
Eosinophils Absolute: 0.4 10*3/uL (ref 0.0–0.5)
Eosinophils Relative: 1 %
HCT: 25.2 % — ABNORMAL LOW (ref 39.0–52.0)
Hemoglobin: 8 g/dL — ABNORMAL LOW (ref 13.0–17.0)
Immature Granulocytes: 3 %
Lymphocytes Relative: 3 %
Lymphs Abs: 0.9 10*3/uL (ref 0.7–4.0)
MCH: 27.7 pg (ref 26.0–34.0)
MCHC: 31.7 g/dL (ref 30.0–36.0)
MCV: 87.2 fL (ref 80.0–100.0)
Monocytes Absolute: 1.5 10*3/uL — ABNORMAL HIGH (ref 0.1–1.0)
Monocytes Relative: 5 %
Neutro Abs: 27.8 10*3/uL — ABNORMAL HIGH (ref 1.7–7.7)
Neutrophils Relative %: 87 %
Platelets: 249 10*3/uL (ref 150–400)
RBC: 2.89 MIL/uL — ABNORMAL LOW (ref 4.22–5.81)
RDW: 21.2 % — ABNORMAL HIGH (ref 11.5–15.5)
WBC: 31.8 10*3/uL — ABNORMAL HIGH (ref 4.0–10.5)
nRBC: 0 % (ref 0.0–0.2)

## 2019-05-27 MED ORDER — HEPARIN SOD (PORK) LOCK FLUSH 100 UNIT/ML IV SOLN
250.0000 [IU] | Freq: Once | INTRAVENOUS | Status: AC | PRN
  Administered 2019-05-27: 10:00:00 250 [IU]

## 2019-05-27 MED ORDER — HEPARIN SOD (PORK) LOCK FLUSH 100 UNIT/ML IV SOLN
INTRAVENOUS | Status: AC
  Filled 2019-05-27: qty 5

## 2019-05-27 MED ORDER — SODIUM CHLORIDE 0.9% FLUSH
10.0000 mL | INTRAVENOUS | Status: AC | PRN
  Administered 2019-05-27: 10 mL via INTRAVENOUS
  Filled 2019-05-27: qty 10

## 2019-05-27 NOTE — Progress Notes (Signed)
DISCONTINUE OFF PATHWAY REGIMEN - Other   [id number]:Gemcitabine + Cisplatin every 28 days:   A cycle is every 28 days:     Gemcitabine      Cisplatin   **Always confirm dose/schedule in your pharmacy ordering system**  REASON: Disease Progression PRIOR TREATMENT: Gemcitabine + Cisplatin every 28 days TREATMENT RESPONSE: Progressive Disease (PD)  START OFF PATHWAY REGIMEN - Other   [id number]:FOLFOX (q14d) **2 cycles per order sheet**:   A cycle is every 14 days:     Oxaliplatin      Leucovorin      Fluorouracil      Fluorouracil   **Always confirm dose/schedule in your pharmacy ordering system**  Patient Characteristics: Intent of Therapy: Non-Curative / Palliative Intent, Discussed with Patient

## 2019-05-29 ENCOUNTER — Telehealth: Payer: Self-pay | Admitting: Emergency Medicine

## 2019-05-29 NOTE — Telephone Encounter (Signed)
Pt called for prescreening call and said that he was cancelling his appt and all further treatment, stating "I'm through" MD notified of patient's decision.

## 2019-06-01 ENCOUNTER — Inpatient Hospital Stay: Payer: PPO | Admitting: Hospice and Palliative Medicine

## 2019-06-01 ENCOUNTER — Inpatient Hospital Stay: Payer: PPO

## 2019-06-01 ENCOUNTER — Inpatient Hospital Stay: Payer: PPO | Admitting: Oncology

## 2019-06-02 NOTE — Telephone Encounter (Signed)
Error

## 2019-06-03 ENCOUNTER — Telehealth: Payer: Self-pay | Admitting: *Deleted

## 2019-06-03 ENCOUNTER — Inpatient Hospital Stay: Payer: PPO

## 2019-06-03 ENCOUNTER — Other Ambulatory Visit: Payer: Self-pay | Admitting: Oncology

## 2019-06-03 MED ORDER — OXYCODONE HCL 10 MG PO TABS: 10.0000 mg | ORAL_TABLET | ORAL | 0 refills | Status: AC | PRN

## 2019-06-03 NOTE — Telephone Encounter (Signed)
Oxyconde called in.

## 2019-06-03 NOTE — Telephone Encounter (Signed)
Lisa wth Hospice called reporting that patient is having RUQ pain rated 5-8 and has no pain medicine in home. She requests something be sent in for pain for him to [facility] in [place]

## 2019-06-03 NOTE — Telephone Encounter (Signed)
[place] Triage nurse informed of prescription sent

## 2019-06-12 ENCOUNTER — Other Ambulatory Visit: Payer: Self-pay | Admitting: *Deleted

## 2019-06-12 MED ORDER — MORPHINE SULFATE (CONCENTRATE) 20 MG/ML PO SOLN: 5.0000 mg | ORAL | 0 refills | Status: DC | PRN

## 2019-06-22 ENCOUNTER — Telehealth: Payer: Self-pay | Admitting: *Deleted

## 2019-06-22 NOTE — Telephone Encounter (Signed)
Patient using Roxanol frequently and RN is asking for extended release narcotic to be ordered, Preferably MS ER

## 2019-06-23 ENCOUNTER — Other Ambulatory Visit: Payer: Self-pay | Admitting: Hospice and Palliative Medicine

## 2019-06-23 MED ORDER — MORPHINE SULFATE ER 15 MG PO TBCR: 15.0000 mg | EXTENDED_RELEASE_TABLET | Freq: Two times a day (BID) | ORAL | 0 refills | Status: AC

## 2019-06-23 NOTE — Progress Notes (Signed)
Spoke with patient's hospice nurse. Patient has been taking morphine elixir every 1-2 hours around the clock for control of symptoms. Will start MS Contin.

## 2019-06-29 ENCOUNTER — Other Ambulatory Visit: Payer: Self-pay | Admitting: Emergency Medicine

## 2019-06-29 ENCOUNTER — Telehealth: Payer: Self-pay

## 2019-06-29 DIAGNOSIS — C221 Intrahepatic bile duct carcinoma: Secondary | ICD-10-CM

## 2019-06-29 MED ORDER — MORPHINE SULFATE (CONCENTRATE) 20 MG/ML PO SOLN: 5.0000 mg | ORAL | 0 refills | Status: AC | PRN

## 2019-06-29 NOTE — Telephone Encounter (Signed)
Patient refuses to take the Morphine tablets and will only take the liquid.  Jennifer with Hospice states that he needs a refill on the Morphine solution.

## 2019-06-29 NOTE — Telephone Encounter (Signed)
Hospice RN states that pt refuses to take morphine tablets and will only take morphine solution and needs refill.

## 2019-07-10 DEATH — deceased

## 2020-09-17 IMAGING — CT CT ANGIOGRAPHY CHEST
2 of 6 series · 18 of 46 positions shown · IV contrast (APPLIED)
Comparison: None

CLINICAL DATA: Cough for couple months, dizziness for few weeks,
weakness, history pacemaker, coronary artery disease post
angioplasty and MI, essential hypertension, hyperlipidemia, obesity,
former smoker

EXAM:
CT ANGIOGRAPHY CHEST WITH CONTRAST
TECHNIQUE: Multidetector CT imaging of the chest was performed using the
standard protocol during bolus administration of intravenous
contrast. Multiplanar CT image reconstructions and MIPs were
obtained to evaluate the vascular anatomy.
CONTRAST:  75mL OMNIPAQUE IOHEXOL 350 MG/ML SOLN IV

[Series 5: thins · axial · 0.81mm/px · z∈[-634,-340]mm · 16 of 324 slices shown]
[im 15/324  lung]
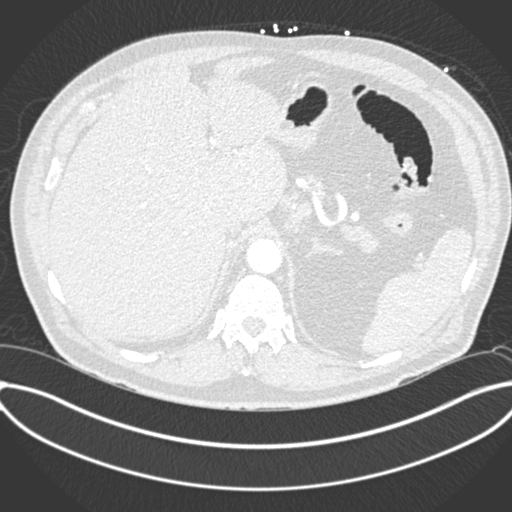
[im 43/324  soft-tissue]
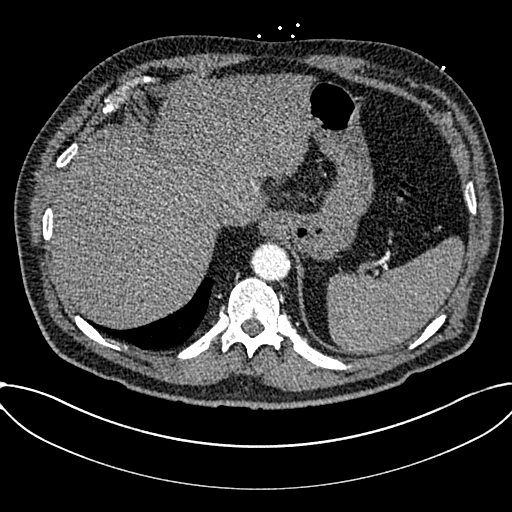
[im 57/324  lung]
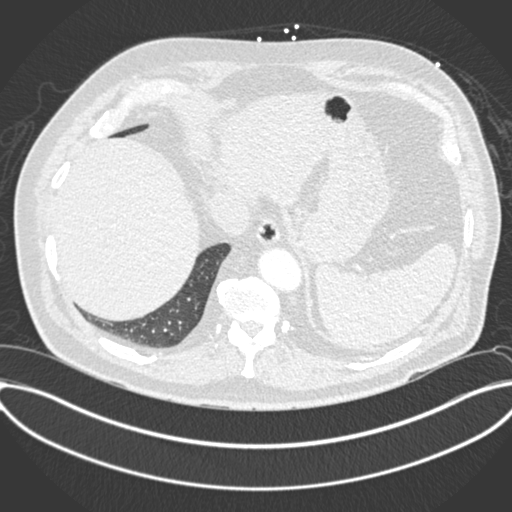
[im 71/324  soft-tissue]
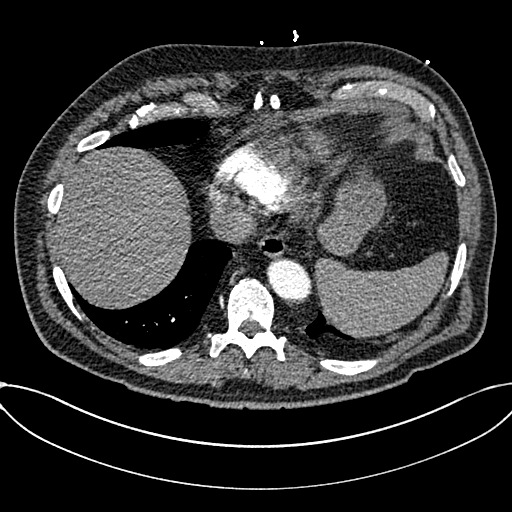
[im 99/324  lung]
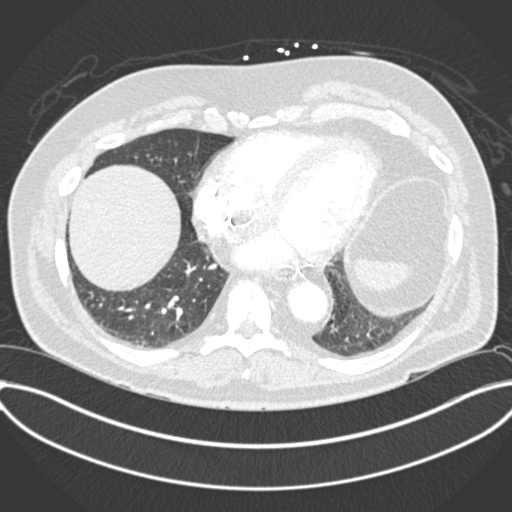
[im 113/324  soft-tissue]
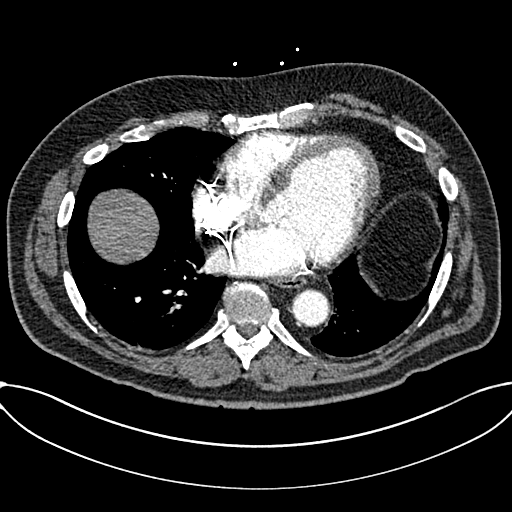
[im 127/324  lung]
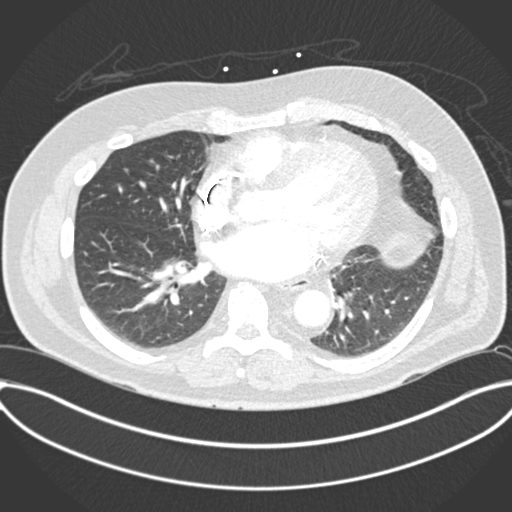
[im 155/324  soft-tissue]
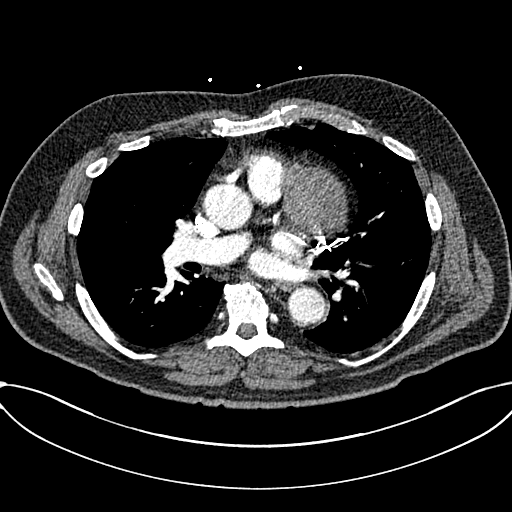
[im 169/324  lung]
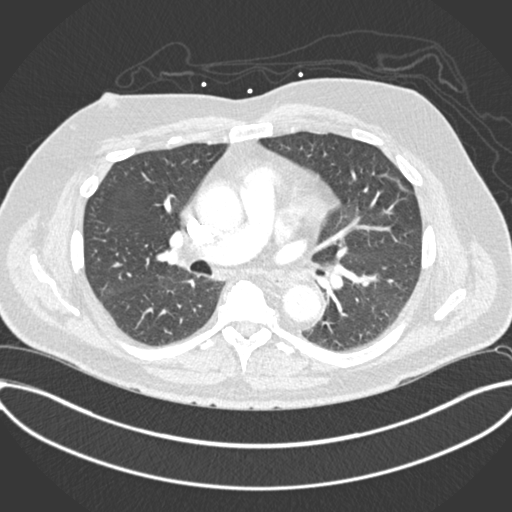
[im 197/324  soft-tissue]
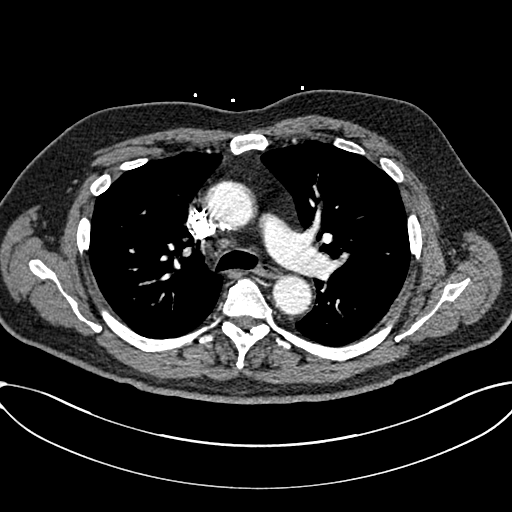
[im 211/324  lung]
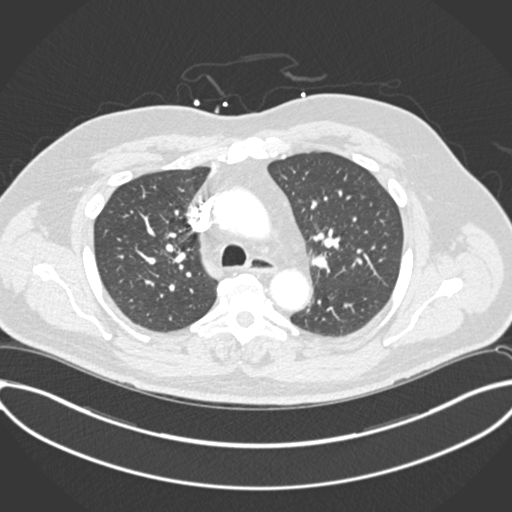
[im 225/324  soft-tissue]
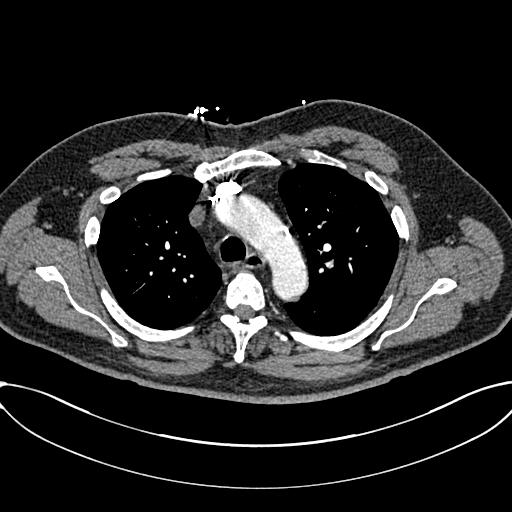
[im 253/324  lung]
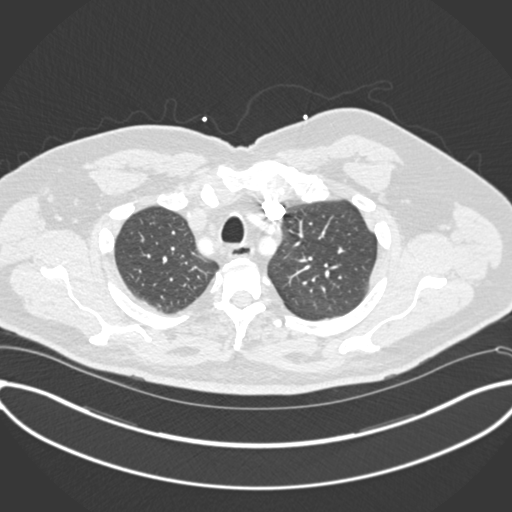
[im 267/324  soft-tissue]
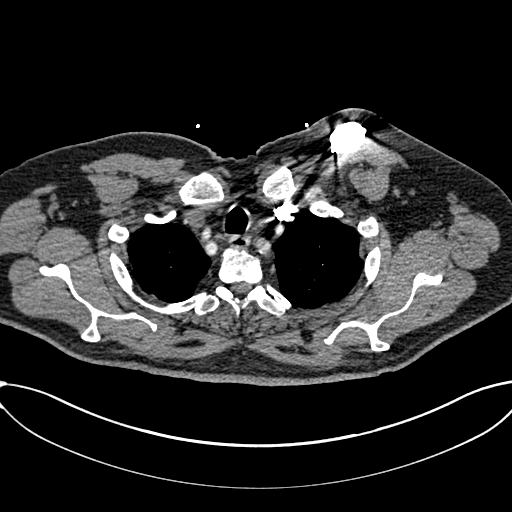
[im 281/324  lung]
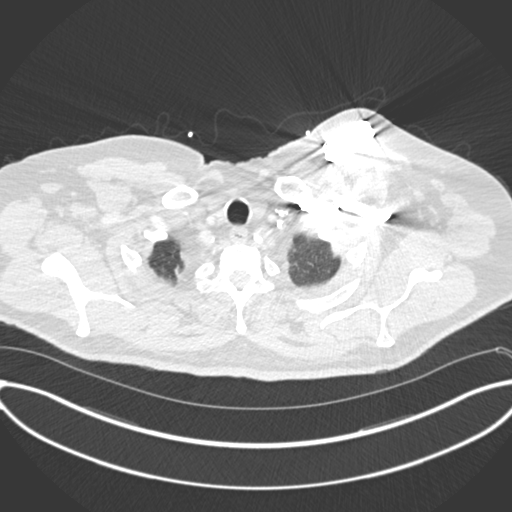
[im 309/324  soft-tissue]
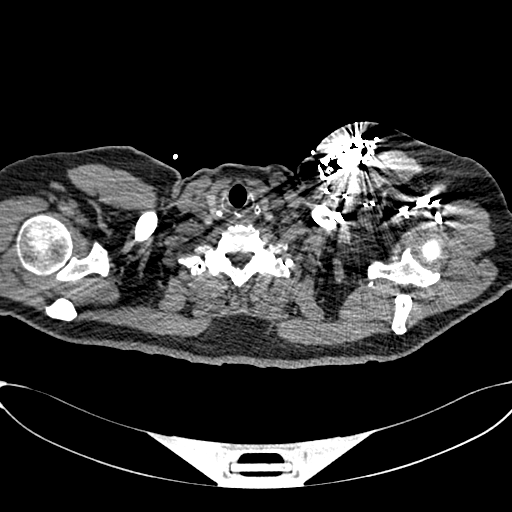

[Series 7: coronal mpr · coronal · 0.64mm/px · 2 of 88 slices shown]
[im 30/88  soft-tissue]
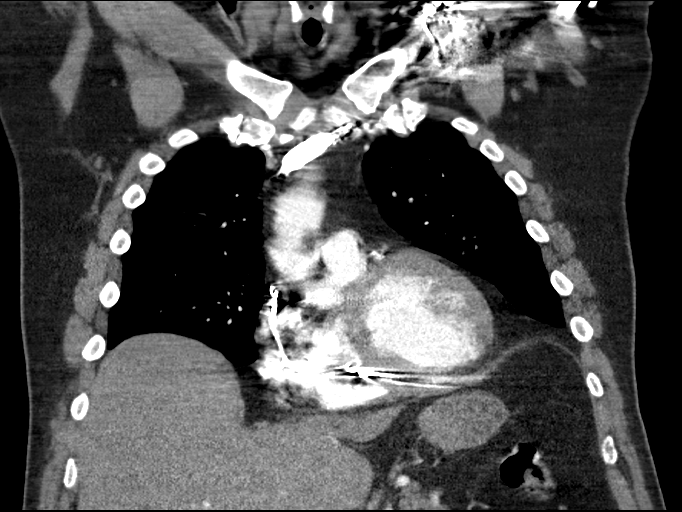
[im 59/88  soft-tissue]
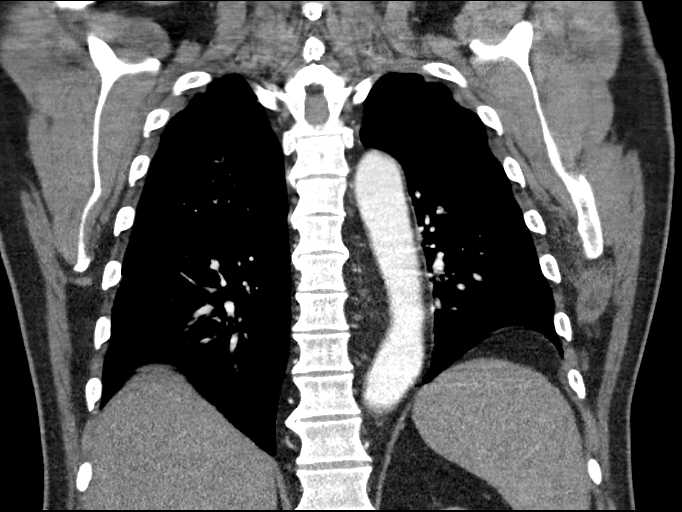

[18 of 46 positions shown; findings below may reference images not displayed]

FINDINGS: Cardiovascular: Atherosclerotic calcifications aorta, proximal great
vessels and coronary arteries. Ascending thoracic aorta upper normal
caliber without aneurysm or dissection. Pacemaker leads via LEFT
subclavian approach project at RIGHT atrium, RIGHT ventricle, and
coronary sinus.

No pericardial effusion. Pulmonary arteries adequately opacified.
Beam hardening artifacts from pacemaker leads and contrast in the
SVC traverse the pulmonary arteries. No definite pulmonary emboli
identified.

Mediastinum/Nodes: Esophagus unremarkable. Base of cervical region
normal appearance. No thoracic adenopathy. Minimal gynecomastia
bilaterally.

Lungs/Pleura: Minimal atelectasis at LEFT base. Lungs otherwise
clear. No pulmonary infiltrate, pleural effusion, or pneumothorax.

Upper Abdomen: Visualized upper abdomen unremarkable

Musculoskeletal: No acute osseous findings.

Review of the MIP images confirms the above findings.
IMPRESSION: No definite evidence of pulmonary embolism.

Scattered atherosclerotic disease changes and pacemaker.

Minimal LEFT basilar atelectasis without pulmonary infiltrate.

Aortic Atherosclerosis ([id number]).

## 2020-10-02 IMAGING — US ULTRASOUND BIOPSY CORE LIVER
1 series · 11 of 11 positions shown · non-contrast
Comparison: none

INDICATION: Multiple liver lesions throughout the liver with no current known
source for metastatic disease. The patient presents for biopsy of
the liver.

[Series 1: ultrasound biopsy core liver · 11 of 11 slices shown]
[im 1/11]
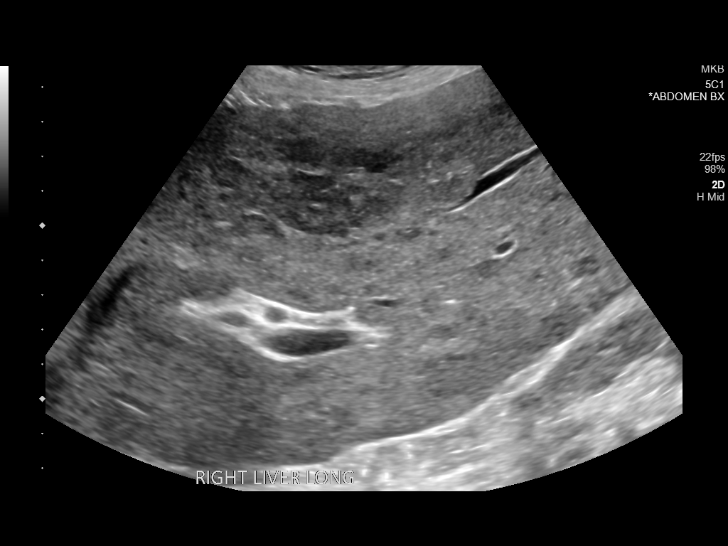
[im 2/11]
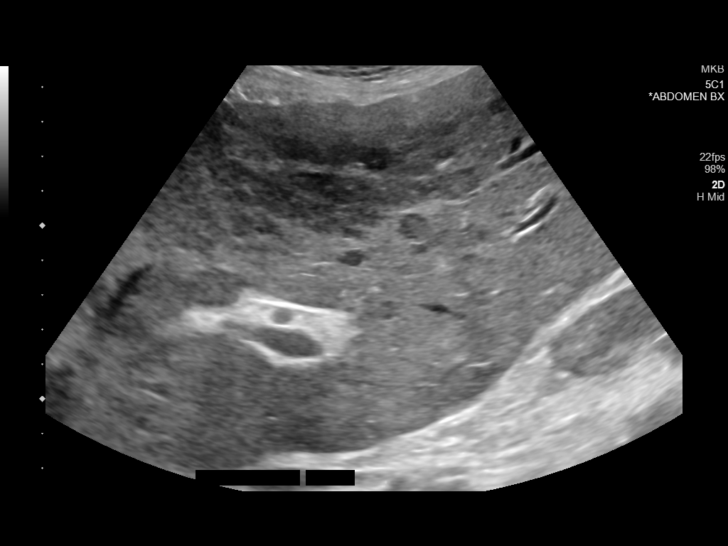
[im 3/11]
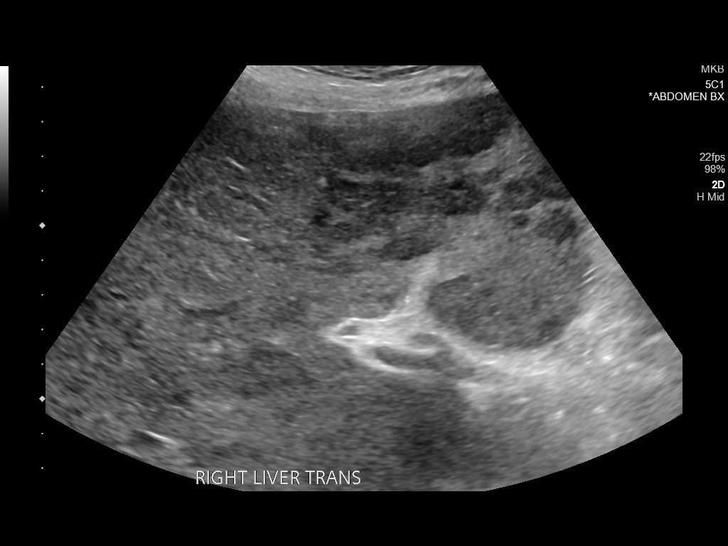
[im 4/11]
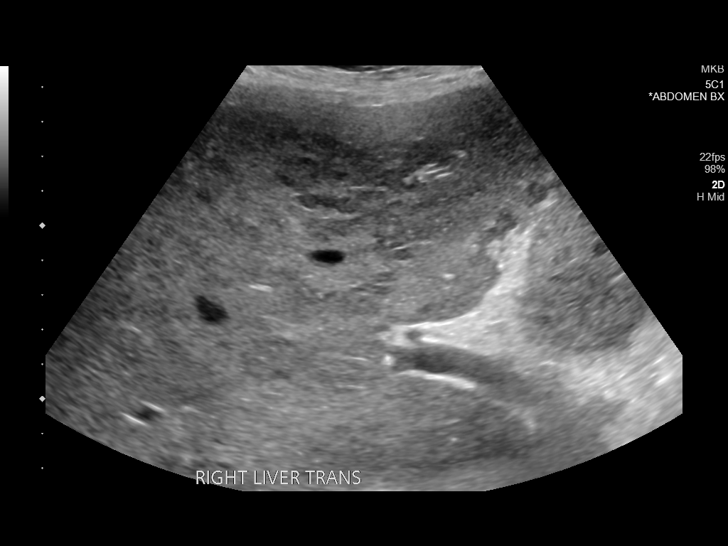
[im 5/11]
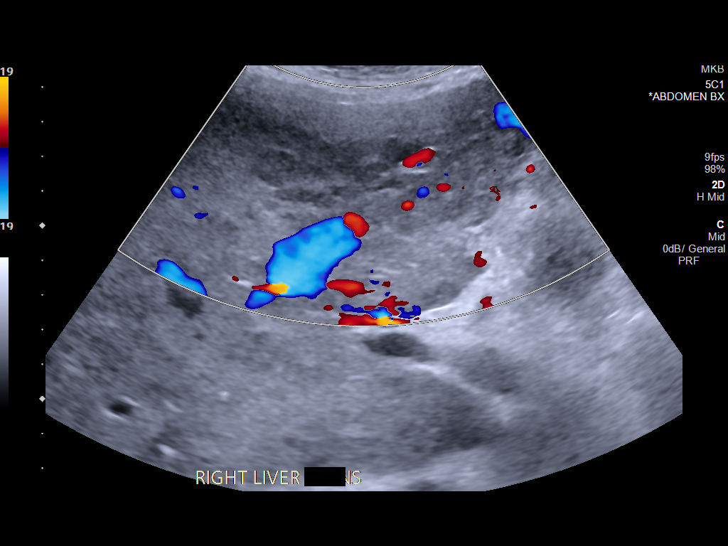
[im 6/11]
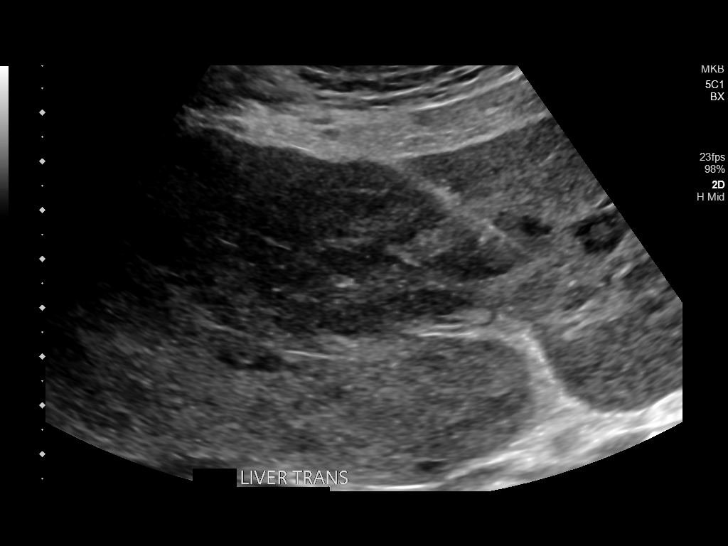
[im 7/11]
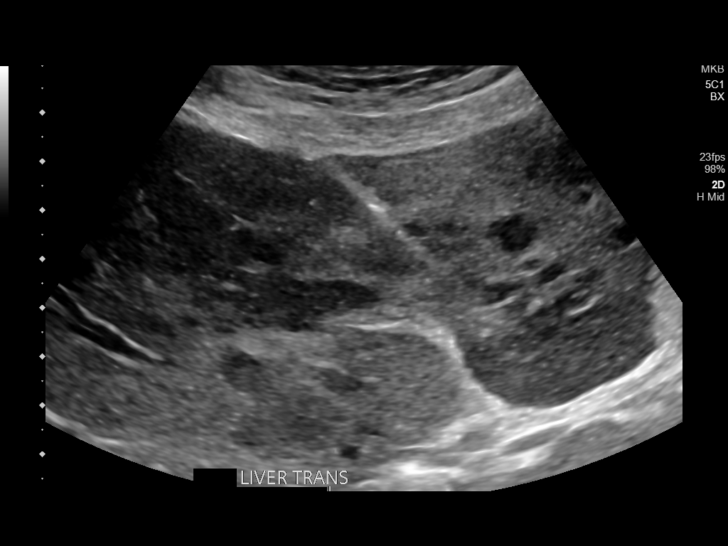
[im 8/11]
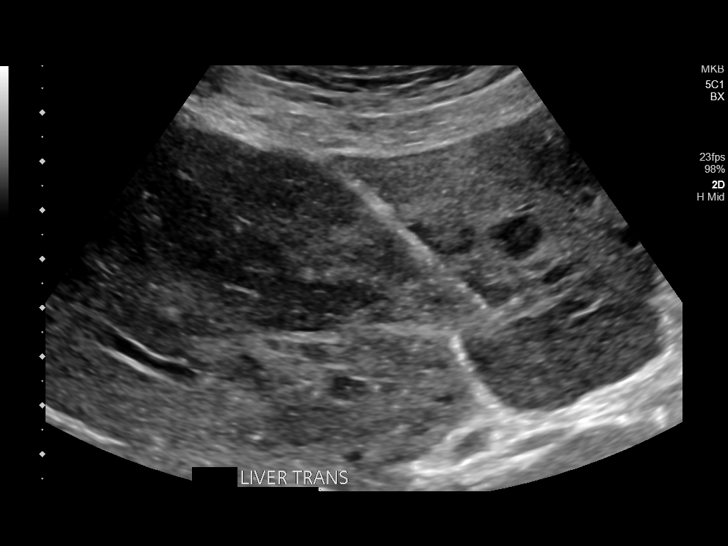
[im 9/11]
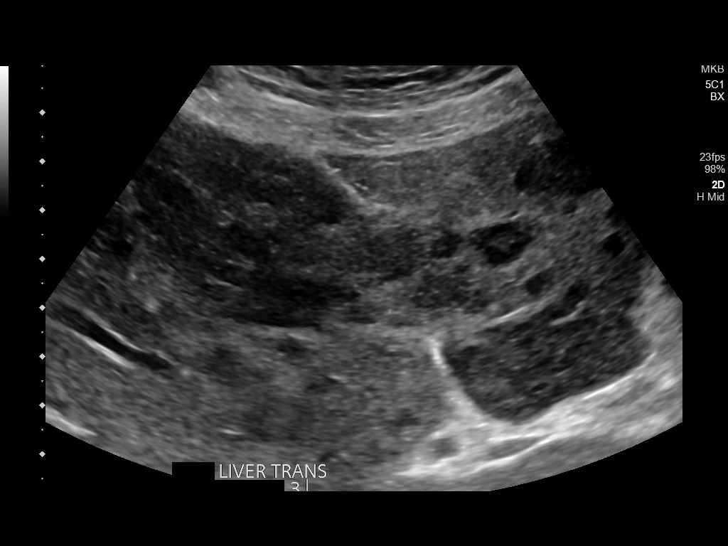
[im 10/11]
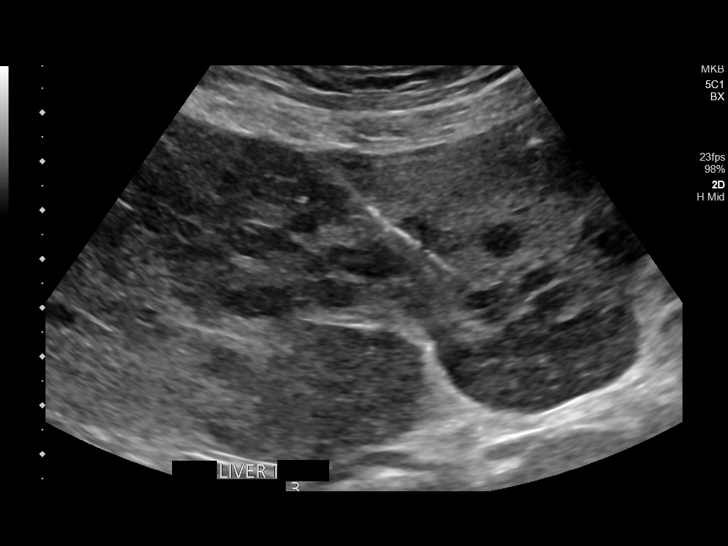
[im 11/11]
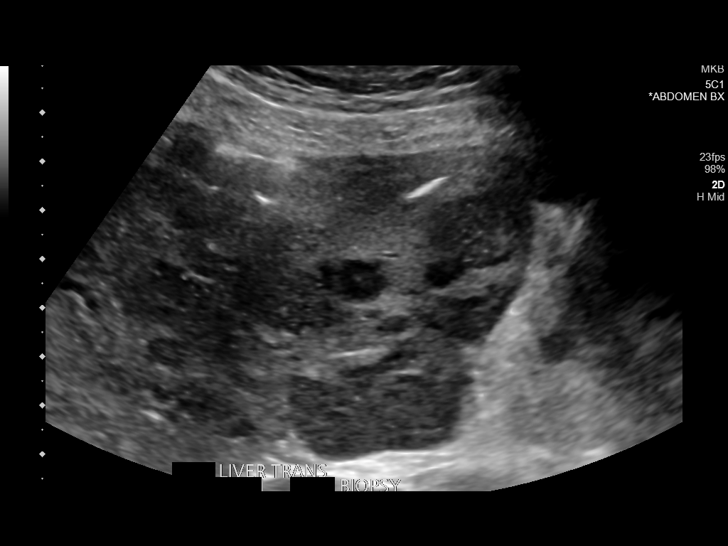

[11 of 11 positions shown; findings below may reference images not displayed]

EXAM:
ULTRASOUND GUIDED CORE BIOPSY OF LIVER

MEDICATIONS:
None.

ANESTHESIA/SEDATION:
Fentanyl 75 mcg IV; Versed 2.0 mg IV

Moderate Sedation Time:  13 minutes.

The patient was continuously monitored during the procedure by the
interventional radiology nurse under my direct supervision.

PROCEDURE:
The procedure, risks, benefits, and alternatives were explained to
the patient. Questions regarding the procedure were encouraged and
answered. The patient understands and consents to the procedure. A
time-out was performed prior to initiating the procedure.

Ultrasound was initially performed of the liver to localize lesions.
The abdominal wall was prepped with chlorhexidine in a sterile
fashion, and a sterile drape was applied covering the operative
field. A sterile gown and sterile gloves were used for the
procedure. Local anesthesia was provided with 1% Lidocaine.

Under ultrasound guidance, a 17 gauge trocar needle was advanced
into the left lobe of the liver. Three separate 18 gauge core biopsy
samples were then obtained under ultrasound guidance with an
automated device. Core biopsy samples were submitted in formalin.
Cut Gel-Foam pledgets were advanced through the outer needle as the
needle was retracted. Additional ultrasound was performed.

COMPLICATIONS:
None immediate.
FINDINGS: Innumerable hypoechoic lesions are seen throughout the liver
parenchyma. Adjacent lesions were chosen in the medial segment of
the left lobe of the liver with the largest measuring approximately
1.6 cm. Solid tissue was obtained.
IMPRESSION: Ultrasound-guided core biopsy performed of lesions in the left lobe
of the liver.

## 2020-10-19 IMAGING — XA IR LEFT FLUORO GUIDE CV LINE
1 series · 1 of 1 positions shown · non-contrast
Comparison: None.

INDICATION: Poor venous access. In need of durable intravenous access for
chemotherapy administration

EXAM:
IMPLANTED PORT A CATH PLACEMENT WITH ULTRASOUND AND FLUOROSCOPIC
GUIDANCE

[Series 2: vasc extremity · 1 of 1 slices shown]
[im 1/1]
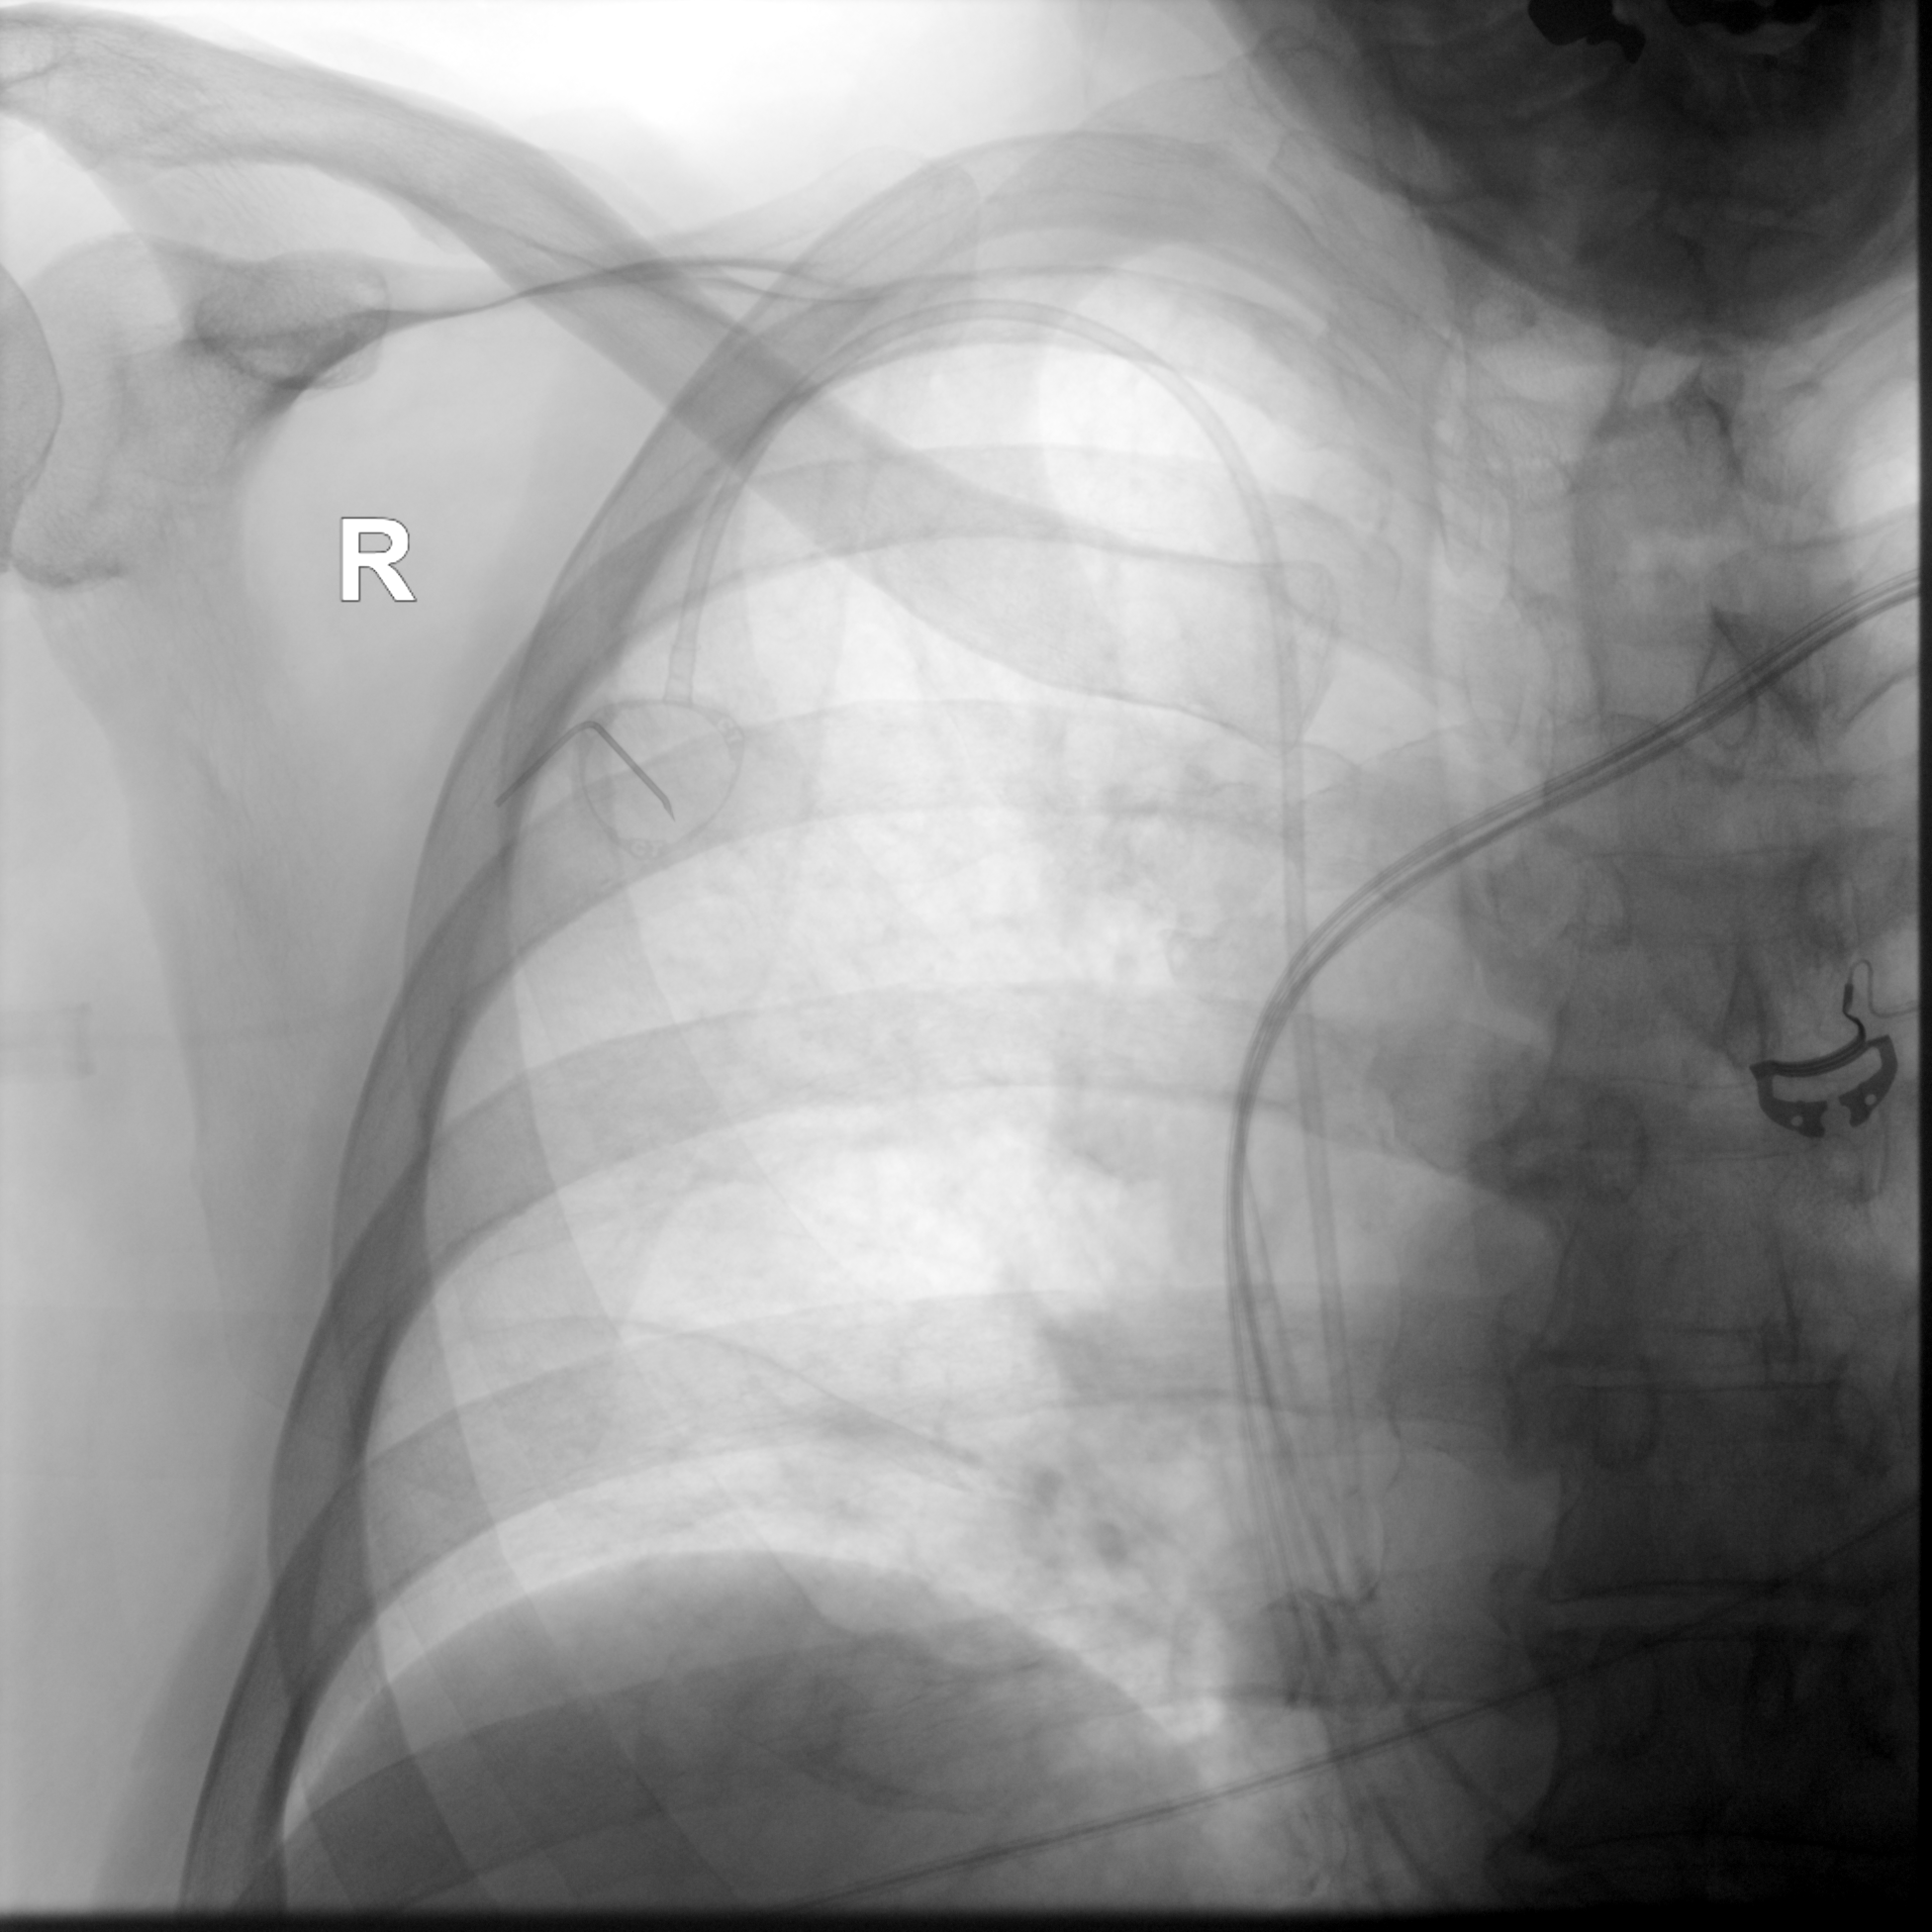

[1 of 1 positions shown; findings below may reference images not displayed]

MEDICATIONS:
Ancef 2 gm IV; The antibiotic was administered within an appropriate
time interval prior to skin puncture.

ANESTHESIA/SEDATION:
Moderate (conscious) sedation was employed during this procedure. A
total of Versed 3 mg and Fentanyl 100 mcg was administered
intravenously.

Moderate Sedation Time: 27 minutes. The patient's level of
consciousness and vital signs were monitored continuously by
radiology nursing throughout the procedure under my direct
supervision.

CONTRAST:  None

FLUOROSCOPY TIME:  24 seconds (6.1 mGy)

COMPLICATIONS:
None immediate.

PROCEDURE:
The procedure, risks, benefits, and alternatives were explained to
the patient. Questions regarding the procedure were encouraged and
answered. The patient understands and consents to the procedure.

The right neck and chest were prepped with chlorhexidine in a
sterile fashion, and a sterile drape was applied covering the
operative field. Maximum barrier sterile technique with sterile
gowns and gloves were used for the procedure. A timeout was
performed prior to the initiation of the procedure. Local anesthesia
was provided with 1% lidocaine with epinephrine.

After creating a small venotomy incision, a micropuncture kit was
utilized to access the internal jugular vein. Real-time ultrasound
guidance was utilized for vascular access including the acquisition
of a permanent ultrasound image documenting patency of the accessed
vessel. The microwire was utilized to measure appropriate catheter
length.

A subcutaneous port pocket was then created along the upper chest
wall utilizing a combination of sharp and blunt dissection. The
pocket was irrigated with sterile saline. A single lumen power
injectable port was chosen for placement. The 8 Fr catheter was
tunneled from the port pocket site to the venotomy incision. The
port was placed in the pocket. The external catheter was trimmed to
appropriate length. At the venotomy, an 8 Fr peel-away sheath was
placed over a guidewire under fluoroscopic guidance. The catheter
was then placed through the sheath and the sheath was removed. Final
catheter positioning was confirmed and documented with a
fluoroscopic spot radiograph. The port was accessed with [place]
needle, aspirated and flushed with heparinized saline.

The venotomy site was closed with an interrupted 4-0 Vicryl suture.
The port pocket incision was closed with interrupted 3-0 Vicryl
suture and the skin was opposed with a running subcuticular 4-0
Vicryl suture. Dermabond and Kgon were applied to both
incisions. Dressings were placed. The patient tolerated the
procedure well without immediate post procedural complication.
FINDINGS: After catheter placement, the tip lies within the superior
cavoatrial junction. The catheter aspirates and flushes normally and
is ready for immediate use.
IMPRESSION: Successful placement of a right internal jugular approach power
injectable Port-A-Cath. The catheter is ready for immediate use.
